# Patient Record
Sex: Male | Born: 1935
Health system: Southern US, Community
[De-identification: ages and names within clinical notes are randomized; demographics above are authoritative.]

## PROBLEM LIST (undated history)

## (undated) DIAGNOSIS — Z9989 Dependence on other enabling machines and devices: Secondary | ICD-10-CM

## (undated) DIAGNOSIS — M1990 Unspecified osteoarthritis, unspecified site: Secondary | ICD-10-CM

## (undated) DIAGNOSIS — E291 Testicular hypofunction: Secondary | ICD-10-CM

## (undated) DIAGNOSIS — Z973 Presence of spectacles and contact lenses: Secondary | ICD-10-CM

## (undated) DIAGNOSIS — S83206A Unspecified tear of unspecified meniscus, current injury, right knee, initial encounter: Secondary | ICD-10-CM

## (undated) DIAGNOSIS — N3010 Interstitial cystitis (chronic) without hematuria: Secondary | ICD-10-CM

## (undated) DIAGNOSIS — Z8659 Personal history of other mental and behavioral disorders: Secondary | ICD-10-CM

## (undated) DIAGNOSIS — Z87828 Personal history of other (healed) physical injury and trauma: Secondary | ICD-10-CM

## (undated) DIAGNOSIS — G4733 Obstructive sleep apnea (adult) (pediatric): Secondary | ICD-10-CM

## (undated) DIAGNOSIS — Z85118 Personal history of other malignant neoplasm of bronchus and lung: Secondary | ICD-10-CM

## (undated) DIAGNOSIS — D649 Anemia, unspecified: Secondary | ICD-10-CM

## (undated) DIAGNOSIS — R300 Dysuria: Secondary | ICD-10-CM

## (undated) DIAGNOSIS — F4310 Post-traumatic stress disorder, unspecified: Secondary | ICD-10-CM

## (undated) DIAGNOSIS — R911 Solitary pulmonary nodule: Secondary | ICD-10-CM

## (undated) DIAGNOSIS — F32A Depression, unspecified: Secondary | ICD-10-CM

## (undated) DIAGNOSIS — R399 Unspecified symptoms and signs involving the genitourinary system: Secondary | ICD-10-CM

## (undated) DIAGNOSIS — Z860100 Personal history of colon polyps, unspecified: Secondary | ICD-10-CM

## (undated) DIAGNOSIS — N529 Male erectile dysfunction, unspecified: Secondary | ICD-10-CM

## (undated) DIAGNOSIS — IMO0002 Reserved for concepts with insufficient information to code with codable children: Secondary | ICD-10-CM

## (undated) DIAGNOSIS — Z8669 Personal history of other diseases of the nervous system and sense organs: Secondary | ICD-10-CM

## (undated) DIAGNOSIS — C61 Malignant neoplasm of prostate: Secondary | ICD-10-CM

## (undated) DIAGNOSIS — E785 Hyperlipidemia, unspecified: Secondary | ICD-10-CM

## (undated) DIAGNOSIS — R972 Elevated prostate specific antigen [PSA]: Secondary | ICD-10-CM

## (undated) DIAGNOSIS — M79672 Pain in left foot: Secondary | ICD-10-CM

## (undated) DIAGNOSIS — Z972 Presence of dental prosthetic device (complete) (partial): Secondary | ICD-10-CM

## (undated) DIAGNOSIS — I700 Atherosclerosis of aorta: Secondary | ICD-10-CM

## (undated) DIAGNOSIS — Z87898 Personal history of other specified conditions: Secondary | ICD-10-CM

## (undated) DIAGNOSIS — Z8739 Personal history of other diseases of the musculoskeletal system and connective tissue: Secondary | ICD-10-CM

## (undated) DIAGNOSIS — R319 Hematuria, unspecified: Secondary | ICD-10-CM

## (undated) DIAGNOSIS — J449 Chronic obstructive pulmonary disease, unspecified: Secondary | ICD-10-CM

## (undated) DIAGNOSIS — I10 Essential (primary) hypertension: Secondary | ICD-10-CM

## (undated) DIAGNOSIS — R600 Localized edema: Secondary | ICD-10-CM

## (undated) DIAGNOSIS — R7303 Prediabetes: Secondary | ICD-10-CM

## (undated) DIAGNOSIS — M722 Plantar fascial fibromatosis: Secondary | ICD-10-CM

## (undated) DIAGNOSIS — E042 Nontoxic multinodular goiter: Secondary | ICD-10-CM

## (undated) DIAGNOSIS — C3491 Malignant neoplasm of unspecified part of right bronchus or lung: Secondary | ICD-10-CM

## (undated) DIAGNOSIS — N400 Enlarged prostate without lower urinary tract symptoms: Secondary | ICD-10-CM

## (undated) DIAGNOSIS — G609 Hereditary and idiopathic neuropathy, unspecified: Secondary | ICD-10-CM

## (undated) DIAGNOSIS — IMO0001 Reserved for inherently not codable concepts without codable children: Secondary | ICD-10-CM

## (undated) HISTORY — PX: PROSTATE BIOPSY: SHX241

## (undated) HISTORY — DX: Personal history of colon polyps, unspecified: Z86.0100

## (undated) HISTORY — DX: Hyperlipidemia, unspecified: E78.5

## (undated) HISTORY — PX: COLONOSCOPY: SHX174

## (undated) HISTORY — DX: Reserved for concepts with insufficient information to code with codable children: IMO0002

## (undated) HISTORY — DX: Solitary pulmonary nodule: R91.1

## (undated) HISTORY — DX: Nontoxic multinodular goiter: E04.2

## (undated) HISTORY — DX: Personal history of colonic polyps: Z86.010

## (undated) HISTORY — DX: Reserved for inherently not codable concepts without codable children: IMO0001

## (undated) HISTORY — DX: Essential (primary) hypertension: I10

## (undated) HISTORY — PX: SHOULDER SURGERY: SHX246

---
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
## 1998-03-10 ENCOUNTER — Ambulatory Visit (HOSPITAL_COMMUNITY): Admission: RE | Admit: 1998-03-10 | Discharge: 1998-03-10 | Payer: Self-pay | Admitting: Gastroenterology

## 1999-04-12 ENCOUNTER — Encounter: Payer: Self-pay | Admitting: *Deleted

## 1999-04-12 ENCOUNTER — Ambulatory Visit (HOSPITAL_COMMUNITY): Admission: RE | Admit: 1999-04-12 | Discharge: 1999-04-12 | Payer: Self-pay | Admitting: *Deleted

## 2001-03-20 ENCOUNTER — Encounter (INDEPENDENT_AMBULATORY_CARE_PROVIDER_SITE_OTHER): Payer: Self-pay | Admitting: *Deleted

## 2001-03-20 ENCOUNTER — Ambulatory Visit (HOSPITAL_COMMUNITY): Admission: RE | Admit: 2001-03-20 | Discharge: 2001-03-20 | Payer: Self-pay | Admitting: Gastroenterology

## 2001-08-03 ENCOUNTER — Encounter: Admission: RE | Admit: 2001-08-03 | Discharge: 2001-08-03 | Payer: Self-pay | Admitting: *Deleted

## 2006-04-24 ENCOUNTER — Ambulatory Visit: Payer: Self-pay | Admitting: Internal Medicine

## 2006-04-24 LAB — CONVERTED CEMR LAB
AST: 22 units/L (ref 0–37)
BUN: 19 mg/dL (ref 6–23)
Bilirubin Urine: NEGATIVE
Chloride: 103 meq/L (ref 96–112)
Chol/HDL Ratio, serum: 7.2
Creatinine, Ser: 1.2 mg/dL (ref 0.4–1.5)
Crystals: NEGATIVE
Eosinophil percent: 3.6 % (ref 0.0–5.0)
HCT: 42.8 % (ref 39.0–52.0)
HDL: 35.3 mg/dL — ABNORMAL LOW (ref >39.0)
Hemoglobin: 14.5 g/dL (ref 13.0–17.0)
Leukocytes, UA: NEGATIVE
Lymphocytes Relative: 38.2 % (ref 12.0–46.0)
MCHC: 33.8 g/dL (ref 30.0–36.0)
MCV: 90.5 fL (ref 78.0–100.0)
Monocytes Absolute: 0.3 10*3/uL (ref 0.2–0.7)
Neutro Abs: 2.9 10*3/uL (ref 1.4–7.7)
Neutrophils Relative %: 51.4 % (ref 43.0–77.0)
Nitrite: NEGATIVE
PSA: 2.95 ng/mL (ref 0.10–4.00)
Triglyceride fasting, serum: 219 mg/dL (ref 0–149)
Urine Glucose: NEGATIVE mg/dL
Urobilinogen, UA: 0.2 (ref 0.0–1.0)

## 2006-04-28 ENCOUNTER — Ambulatory Visit: Payer: Self-pay | Admitting: Internal Medicine

## 2006-05-11 ENCOUNTER — Ambulatory Visit: Payer: Self-pay | Admitting: Internal Medicine

## 2006-05-15 ENCOUNTER — Ambulatory Visit: Payer: Self-pay | Admitting: Internal Medicine

## 2006-05-15 LAB — CONVERTED CEMR LAB
HDL: 43 mg/dL (ref >39.0)
LDL DIRECT: 189 mg/dL
Triglyceride fasting, serum: 158 mg/dL — ABNORMAL HIGH (ref 0–149)
VLDL: 32 mg/dL (ref 0–40)

## 2006-05-16 ENCOUNTER — Ambulatory Visit: Payer: Self-pay | Admitting: Internal Medicine

## 2006-06-19 ENCOUNTER — Ambulatory Visit: Payer: Self-pay | Admitting: Internal Medicine

## 2006-06-23 ENCOUNTER — Ambulatory Visit: Payer: Self-pay | Admitting: Internal Medicine

## 2006-08-04 ENCOUNTER — Ambulatory Visit: Payer: Self-pay | Admitting: Internal Medicine

## 2006-10-17 ENCOUNTER — Ambulatory Visit: Payer: Self-pay | Admitting: Internal Medicine

## 2006-10-17 LAB — CONVERTED CEMR LAB: TSH: 1.94 microintl units/mL (ref 0.35–5.50)

## 2006-10-24 ENCOUNTER — Ambulatory Visit: Payer: Self-pay | Admitting: Internal Medicine

## 2006-11-13 ENCOUNTER — Ambulatory Visit: Payer: Self-pay | Admitting: Internal Medicine

## 2006-11-13 LAB — CONVERTED CEMR LAB
ALT: 51 units/L — ABNORMAL HIGH (ref 0–40)
AST: 37 units/L (ref 0–37)
Alkaline Phosphatase: 64 units/L (ref 39–117)
Bilirubin, Direct: 0.1 mg/dL (ref 0.0–0.3)
Calcium: 9.6 mg/dL (ref 8.4–10.5)
Chloride: 107 meq/L (ref 96–112)
GFR calc non Af Amer: 78 mL/min
Glucose, Bld: 116 mg/dL — ABNORMAL HIGH (ref 70–99)

## 2006-11-23 ENCOUNTER — Ambulatory Visit: Payer: Self-pay | Admitting: Internal Medicine

## 2006-12-07 ENCOUNTER — Ambulatory Visit: Payer: Self-pay | Admitting: Internal Medicine

## 2006-12-09 ENCOUNTER — Encounter: Payer: Self-pay | Admitting: Internal Medicine

## 2006-12-18 ENCOUNTER — Ambulatory Visit: Payer: Self-pay | Admitting: Cardiology

## 2006-12-25 ENCOUNTER — Ambulatory Visit: Payer: Self-pay | Admitting: Internal Medicine

## 2007-01-01 ENCOUNTER — Encounter: Admission: RE | Admit: 2007-01-01 | Discharge: 2007-01-01 | Payer: Self-pay | Admitting: Internal Medicine

## 2007-02-02 ENCOUNTER — Encounter: Payer: Self-pay | Admitting: Internal Medicine

## 2007-02-02 DIAGNOSIS — Z860100 Personal history of colon polyps, unspecified: Secondary | ICD-10-CM | POA: Insufficient documentation

## 2007-02-02 DIAGNOSIS — E291 Testicular hypofunction: Secondary | ICD-10-CM

## 2007-02-02 DIAGNOSIS — E785 Hyperlipidemia, unspecified: Secondary | ICD-10-CM | POA: Insufficient documentation

## 2007-02-02 DIAGNOSIS — I10 Essential (primary) hypertension: Secondary | ICD-10-CM

## 2007-02-02 DIAGNOSIS — N400 Enlarged prostate without lower urinary tract symptoms: Secondary | ICD-10-CM

## 2007-02-05 DIAGNOSIS — M109 Gout, unspecified: Secondary | ICD-10-CM

## 2007-02-07 ENCOUNTER — Ambulatory Visit: Payer: Self-pay | Admitting: Internal Medicine

## 2007-02-12 ENCOUNTER — Ambulatory Visit: Payer: Self-pay | Admitting: Internal Medicine

## 2007-02-14 ENCOUNTER — Ambulatory Visit (HOSPITAL_COMMUNITY): Admission: RE | Admit: 2007-02-14 | Discharge: 2007-02-14 | Payer: Self-pay | Admitting: Urology

## 2007-02-14 HISTORY — PX: SPERMATOCELECTOMY: SHX2420

## 2007-03-07 ENCOUNTER — Ambulatory Visit: Payer: Self-pay | Admitting: Internal Medicine

## 2007-03-07 LAB — CONVERTED CEMR LAB
BUN: 18 mg/dL (ref 6–23)
CO2: 29 meq/L (ref 19–32)
Calcium: 9.7 mg/dL (ref 8.4–10.5)
Chloride: 106 meq/L (ref 96–112)
Creatinine, Ser: 1.1 mg/dL (ref 0.4–1.5)
Sex Hormone Binding: 8 nmol/L — ABNORMAL LOW (ref 13–71)
Testosterone Free: 44.7 pg/mL — ABNORMAL LOW (ref 47.0–244.0)
Testosterone: 133.03 ng/dL — ABNORMAL LOW (ref 350.00–890)
Testosterone: 135.87 ng/dL — ABNORMAL LOW (ref 350–890)
Uric Acid, Serum: 7.3 mg/dL — ABNORMAL HIGH (ref 2.4–7.0)

## 2007-03-29 ENCOUNTER — Ambulatory Visit: Payer: Self-pay | Admitting: Internal Medicine

## 2007-04-05 ENCOUNTER — Ambulatory Visit: Payer: Self-pay | Admitting: Internal Medicine

## 2007-04-10 ENCOUNTER — Encounter: Admission: RE | Admit: 2007-04-10 | Discharge: 2007-04-10 | Payer: Self-pay | Admitting: Internal Medicine

## 2007-05-10 ENCOUNTER — Ambulatory Visit: Payer: Self-pay | Admitting: Internal Medicine

## 2007-05-28 ENCOUNTER — Ambulatory Visit: Payer: Self-pay | Admitting: Internal Medicine

## 2007-06-12 ENCOUNTER — Encounter: Payer: Self-pay | Admitting: Internal Medicine

## 2007-06-13 ENCOUNTER — Telehealth: Payer: Self-pay | Admitting: Internal Medicine

## 2007-06-18 ENCOUNTER — Ambulatory Visit: Payer: Self-pay | Admitting: Cardiovascular Disease

## 2007-07-02 ENCOUNTER — Telehealth: Payer: Self-pay | Admitting: Internal Medicine

## 2007-07-09 ENCOUNTER — Telehealth: Payer: Self-pay | Admitting: Internal Medicine

## 2007-07-25 ENCOUNTER — Ambulatory Visit: Payer: Self-pay | Admitting: Thoracic Surgery

## 2007-07-31 ENCOUNTER — Ambulatory Visit (HOSPITAL_COMMUNITY): Admission: RE | Admit: 2007-07-31 | Discharge: 2007-07-31 | Payer: Self-pay | Admitting: Thoracic Surgery

## 2007-08-03 ENCOUNTER — Ambulatory Visit: Payer: Self-pay | Admitting: Internal Medicine

## 2007-08-03 DIAGNOSIS — J984 Other disorders of lung: Secondary | ICD-10-CM | POA: Insufficient documentation

## 2007-08-06 ENCOUNTER — Encounter: Payer: Self-pay | Admitting: Internal Medicine

## 2007-08-06 HISTORY — PX: CARDIOVASCULAR STRESS TEST: SHX262

## 2007-08-09 ENCOUNTER — Encounter: Payer: Self-pay | Admitting: Thoracic Surgery

## 2007-08-09 ENCOUNTER — Inpatient Hospital Stay (HOSPITAL_COMMUNITY): Admission: RE | Admit: 2007-08-09 | Discharge: 2007-08-14 | Payer: Self-pay | Admitting: Thoracic Surgery

## 2007-08-09 HISTORY — PX: VIDEO ASSISTED THORACOSCOPY (VATS)/WEDGE RESECTION: SHX6174

## 2007-08-10 ENCOUNTER — Ambulatory Visit: Payer: Self-pay | Admitting: Thoracic Surgery

## 2007-08-23 ENCOUNTER — Encounter: Admission: RE | Admit: 2007-08-23 | Discharge: 2007-08-23 | Payer: Self-pay | Admitting: Thoracic Surgery

## 2007-08-23 ENCOUNTER — Ambulatory Visit: Payer: Self-pay | Admitting: Thoracic Surgery

## 2007-09-06 ENCOUNTER — Encounter: Admission: RE | Admit: 2007-09-06 | Discharge: 2007-09-06 | Payer: Self-pay | Admitting: Thoracic Surgery

## 2007-09-06 ENCOUNTER — Ambulatory Visit: Payer: Self-pay | Admitting: Thoracic Surgery

## 2007-09-17 ENCOUNTER — Ambulatory Visit: Payer: Self-pay | Admitting: Thoracic Surgery

## 2007-09-25 ENCOUNTER — Ambulatory Visit: Payer: Self-pay | Admitting: Internal Medicine

## 2007-09-25 DIAGNOSIS — M545 Low back pain: Secondary | ICD-10-CM

## 2007-10-18 ENCOUNTER — Ambulatory Visit: Payer: Self-pay | Admitting: Thoracic Surgery

## 2007-10-18 ENCOUNTER — Encounter: Admission: RE | Admit: 2007-10-18 | Discharge: 2007-10-18 | Payer: Self-pay | Admitting: Thoracic Surgery

## 2007-10-22 ENCOUNTER — Telehealth: Payer: Self-pay | Admitting: Internal Medicine

## 2007-11-09 ENCOUNTER — Telehealth (INDEPENDENT_AMBULATORY_CARE_PROVIDER_SITE_OTHER): Payer: Self-pay | Admitting: *Deleted

## 2007-11-29 ENCOUNTER — Encounter: Payer: Self-pay | Admitting: Internal Medicine

## 2007-12-08 ENCOUNTER — Encounter: Admission: RE | Admit: 2007-12-08 | Discharge: 2007-12-08 | Payer: Self-pay | Admitting: Neurology

## 2007-12-19 ENCOUNTER — Ambulatory Visit: Payer: Self-pay | Admitting: Oncology

## 2007-12-27 ENCOUNTER — Encounter: Payer: Self-pay | Admitting: Internal Medicine

## 2007-12-27 LAB — CBC WITH DIFFERENTIAL/PLATELET
Basophils Absolute: 0 10*3/uL (ref 0.0–0.1)
EOS%: 4.4 % (ref 0.0–7.0)
Eosinophils Absolute: 0.3 10*3/uL (ref 0.0–0.5)
HCT: 40.5 % (ref 38.7–49.9)
HGB: 14 g/dL (ref 13.0–17.1)
MCH: 30.3 pg (ref 28.0–33.4)
MCV: 87.6 fL (ref 81.6–98.0)
NEUT%: 51.5 % (ref 40.0–75.0)
lymph#: 2.2 10*3/uL (ref 0.9–3.3)

## 2007-12-28 ENCOUNTER — Encounter: Payer: Self-pay | Admitting: Internal Medicine

## 2007-12-31 LAB — SPEP & IFE WITH QIG
Albumin ELP: 60.5 % (ref 55.8–66.1)
Beta Globulin: 6.3 % (ref 4.7–7.2)
IgA: 252 mg/dL (ref 68–378)
IgG (Immunoglobin G), Serum: 860 mg/dL (ref 694–1618)
IgM, Serum: 60 mg/dL (ref 60–263)
Total Protein, Serum Electrophoresis: 7 g/dL (ref 6.0–8.3)

## 2007-12-31 LAB — COMPREHENSIVE METABOLIC PANEL
AST: 11 U/L (ref 0–37)
BUN: 17 mg/dL (ref 6–23)
Calcium: 9.1 mg/dL (ref 8.4–10.5)
Chloride: 104 mEq/L (ref 96–112)
Creatinine, Ser: 1.02 mg/dL (ref 0.40–1.50)
Glucose, Bld: 90 mg/dL (ref 70–99)

## 2008-01-02 ENCOUNTER — Ambulatory Visit (HOSPITAL_COMMUNITY): Admission: RE | Admit: 2008-01-02 | Discharge: 2008-01-02 | Payer: Self-pay | Admitting: Oncology

## 2008-01-02 LAB — UIFE/LIGHT CHAINS/TP QN, 24-HR UR
Alpha 1, Urine: DETECTED — AB
Free Kappa Lt Chains,Ur: 1.39 mg/dL (ref 0.04–1.51)
Free Kappa/Lambda Ratio: 13.9 ratio — ABNORMAL HIGH (ref 0.46–4.00)
Free Lambda Excretion/Day: 2.15 mg/d
Free Lt Chn Excr Rate: 29.89 mg/d
Gamma Globulin, Urine: DETECTED — AB
Time: 24 hours
Total Protein, Urine: 2.6 mg/dL

## 2008-01-16 ENCOUNTER — Ambulatory Visit: Payer: Self-pay | Admitting: Internal Medicine

## 2008-01-16 LAB — CONVERTED CEMR LAB
BUN: 14 mg/dL (ref 6–23)
Creatinine, Ser: 1 mg/dL (ref 0.4–1.5)
GFR calc Af Amer: 93 mL/min
GFR calc non Af Amer: 77 mL/min
Glucose, Bld: 101 mg/dL — ABNORMAL HIGH (ref 70–99)

## 2008-01-17 ENCOUNTER — Encounter: Payer: Self-pay | Admitting: Internal Medicine

## 2008-01-22 ENCOUNTER — Ambulatory Visit: Payer: Self-pay | Admitting: Internal Medicine

## 2008-01-22 DIAGNOSIS — R040 Epistaxis: Secondary | ICD-10-CM

## 2008-01-22 DIAGNOSIS — H4920 Sixth [abducent] nerve palsy, unspecified eye: Secondary | ICD-10-CM | POA: Insufficient documentation

## 2008-01-25 ENCOUNTER — Encounter: Payer: Self-pay | Admitting: Internal Medicine

## 2008-02-20 ENCOUNTER — Ambulatory Visit: Payer: Self-pay | Admitting: Thoracic Surgery

## 2008-02-20 ENCOUNTER — Encounter: Admission: RE | Admit: 2008-02-20 | Discharge: 2008-02-20 | Payer: Self-pay | Admitting: Thoracic Surgery

## 2008-03-04 ENCOUNTER — Encounter: Payer: Self-pay | Admitting: Internal Medicine

## 2008-04-07 ENCOUNTER — Telehealth: Payer: Self-pay | Admitting: Internal Medicine

## 2008-04-21 ENCOUNTER — Ambulatory Visit: Payer: Self-pay | Admitting: Internal Medicine

## 2008-05-14 ENCOUNTER — Encounter: Payer: Self-pay | Admitting: Internal Medicine

## 2008-05-23 ENCOUNTER — Encounter: Admission: RE | Admit: 2008-05-23 | Discharge: 2008-05-23 | Payer: Self-pay | Admitting: Neurology

## 2008-06-12 ENCOUNTER — Encounter: Payer: Self-pay | Admitting: Internal Medicine

## 2008-06-20 ENCOUNTER — Telehealth: Payer: Self-pay | Admitting: Internal Medicine

## 2008-06-24 ENCOUNTER — Ambulatory Visit: Payer: Self-pay | Admitting: Internal Medicine

## 2008-06-25 LAB — CONVERTED CEMR LAB
ALT: 17 units/L (ref 0–53)
Alkaline Phosphatase: 65 units/L (ref 39–117)
Bilirubin, Direct: 0.2 mg/dL (ref 0.0–0.3)
CO2: 29 meq/L (ref 19–32)
GFR calc Af Amer: 93 mL/min
Glucose, Bld: 112 mg/dL — ABNORMAL HIGH (ref 70–99)
PSA: 3.16 ng/mL (ref 0.10–4.00)
Potassium: 5.3 meq/L — ABNORMAL HIGH (ref 3.5–5.1)
Sodium: 141 meq/L (ref 135–145)
Total Bilirubin: 1 mg/dL (ref 0.3–1.2)
Total CHOL/HDL Ratio: 6.6
Total Protein: 6.5 g/dL (ref 6.0–8.3)

## 2008-06-30 ENCOUNTER — Ambulatory Visit: Payer: Self-pay | Admitting: Internal Medicine

## 2008-06-30 DIAGNOSIS — G609 Hereditary and idiopathic neuropathy, unspecified: Secondary | ICD-10-CM | POA: Insufficient documentation

## 2008-07-11 ENCOUNTER — Ambulatory Visit: Payer: Self-pay | Admitting: Internal Medicine

## 2008-07-11 DIAGNOSIS — E875 Hyperkalemia: Secondary | ICD-10-CM | POA: Insufficient documentation

## 2008-08-20 ENCOUNTER — Encounter: Admission: RE | Admit: 2008-08-20 | Discharge: 2008-08-20 | Payer: Self-pay | Admitting: Thoracic Surgery

## 2008-08-20 ENCOUNTER — Ambulatory Visit: Payer: Self-pay | Admitting: Thoracic Surgery

## 2008-10-07 ENCOUNTER — Ambulatory Visit: Payer: Self-pay | Admitting: Internal Medicine

## 2008-10-07 ENCOUNTER — Telehealth: Payer: Self-pay | Admitting: Internal Medicine

## 2008-10-09 ENCOUNTER — Ambulatory Visit: Payer: Self-pay | Admitting: Internal Medicine

## 2008-10-09 LAB — CONVERTED CEMR LAB
CO2: 29 meq/L (ref 19–32)
Calcium: 9.5 mg/dL (ref 8.4–10.5)
Chloride: 102 meq/L (ref 96–112)
Glucose, Bld: 100 mg/dL — ABNORMAL HIGH (ref 70–99)
HDL: 34.9 mg/dL — ABNORMAL LOW (ref >39.00)
Hgb A1c MFr Bld: 6.2 % (ref 4.6–6.5)
Sodium: 139 meq/L (ref 135–145)
Total CHOL/HDL Ratio: 7
Triglycerides: 189 mg/dL — ABNORMAL HIGH (ref 0.0–149.0)
Vitamin B-12: 368 pg/mL (ref 211–911)

## 2008-10-12 ENCOUNTER — Encounter: Payer: Self-pay | Admitting: Internal Medicine

## 2008-12-02 ENCOUNTER — Telehealth: Payer: Self-pay | Admitting: Internal Medicine

## 2009-02-25 ENCOUNTER — Ambulatory Visit: Payer: Self-pay | Admitting: Thoracic Surgery

## 2009-02-25 ENCOUNTER — Encounter: Admission: RE | Admit: 2009-02-25 | Discharge: 2009-02-25 | Payer: Self-pay | Admitting: Thoracic Surgery

## 2009-03-12 ENCOUNTER — Ambulatory Visit: Payer: Self-pay | Admitting: Oncology

## 2009-03-16 LAB — CBC WITH DIFFERENTIAL/PLATELET
Eosinophils Absolute: 0.3 10*3/uL (ref 0.0–0.5)
MONO#: 0.5 10*3/uL (ref 0.1–0.9)
NEUT#: 3.5 10*3/uL (ref 1.5–6.5)
Platelets: 306 10*3/uL (ref 140–400)
RBC: 4.22 10*6/uL (ref 4.20–5.82)
RDW: 13.2 % (ref 11.0–14.6)
WBC: 6.4 10*3/uL (ref 4.0–10.3)

## 2009-03-18 LAB — COMPREHENSIVE METABOLIC PANEL
Albumin: 4.3 g/dL (ref 3.5–5.2)
CO2: 28 mEq/L (ref 19–32)
Glucose, Bld: 100 mg/dL — ABNORMAL HIGH (ref 70–99)
Potassium: 4.3 mEq/L (ref 3.5–5.3)
Sodium: 141 mEq/L (ref 135–145)
Total Protein: 6.7 g/dL (ref 6.0–8.3)

## 2009-03-18 LAB — SPEP & IFE WITH QIG
Albumin ELP: 59.9 % (ref 55.8–66.1)
Alpha-1-Globulin: 4.4 % (ref 2.9–4.9)
Beta 2: 5.4 % (ref 3.2–6.5)
Beta Globulin: 6.3 % (ref 4.7–7.2)

## 2009-03-18 LAB — KAPPA/LAMBDA LIGHT CHAINS
Kappa free light chain: 0.95 mg/dL (ref 0.33–1.94)
Lambda Free Lght Chn: 1.29 mg/dL (ref 0.57–2.63)

## 2009-03-24 ENCOUNTER — Encounter: Payer: Self-pay | Admitting: Internal Medicine

## 2009-04-01 ENCOUNTER — Telehealth: Payer: Self-pay | Admitting: Internal Medicine

## 2009-04-27 ENCOUNTER — Ambulatory Visit: Payer: Self-pay | Admitting: Internal Medicine

## 2009-04-30 ENCOUNTER — Ambulatory Visit: Payer: Self-pay | Admitting: Internal Medicine

## 2009-05-06 ENCOUNTER — Encounter: Payer: Self-pay | Admitting: Internal Medicine

## 2009-05-13 ENCOUNTER — Telehealth: Payer: Self-pay | Admitting: Internal Medicine

## 2009-05-14 ENCOUNTER — Ambulatory Visit: Payer: Self-pay | Admitting: Internal Medicine

## 2009-05-14 DIAGNOSIS — J069 Acute upper respiratory infection, unspecified: Secondary | ICD-10-CM | POA: Insufficient documentation

## 2009-06-01 ENCOUNTER — Encounter: Payer: Self-pay | Admitting: Internal Medicine

## 2009-07-02 ENCOUNTER — Telehealth: Payer: Self-pay | Admitting: Internal Medicine

## 2009-08-05 ENCOUNTER — Ambulatory Visit: Payer: Self-pay | Admitting: Thoracic Surgery

## 2009-08-05 ENCOUNTER — Encounter: Admission: RE | Admit: 2009-08-05 | Discharge: 2009-08-05 | Payer: Self-pay | Admitting: Thoracic Surgery

## 2009-09-20 ENCOUNTER — Emergency Department (HOSPITAL_COMMUNITY): Admission: EM | Admit: 2009-09-20 | Discharge: 2009-09-20 | Payer: Self-pay | Admitting: Emergency Medicine

## 2009-09-23 ENCOUNTER — Ambulatory Visit: Payer: Self-pay | Admitting: Internal Medicine

## 2009-09-24 ENCOUNTER — Telehealth: Payer: Self-pay | Admitting: Internal Medicine

## 2009-10-07 ENCOUNTER — Encounter: Payer: Self-pay | Admitting: Internal Medicine

## 2009-12-11 ENCOUNTER — Ambulatory Visit: Payer: Self-pay | Admitting: Oncology

## 2010-02-03 ENCOUNTER — Ambulatory Visit: Payer: Self-pay | Admitting: Thoracic Surgery

## 2010-02-03 ENCOUNTER — Encounter: Admission: RE | Admit: 2010-02-03 | Discharge: 2010-02-03 | Payer: Self-pay | Admitting: Thoracic Surgery

## 2010-02-15 ENCOUNTER — Telehealth: Payer: Self-pay | Admitting: Internal Medicine

## 2010-02-17 ENCOUNTER — Encounter: Payer: Self-pay | Admitting: Internal Medicine

## 2010-03-22 ENCOUNTER — Ambulatory Visit: Payer: Self-pay | Admitting: Internal Medicine

## 2010-04-15 ENCOUNTER — Telehealth: Payer: Self-pay | Admitting: Internal Medicine

## 2010-04-15 DIAGNOSIS — G479 Sleep disorder, unspecified: Secondary | ICD-10-CM | POA: Insufficient documentation

## 2010-05-17 ENCOUNTER — Ambulatory Visit: Payer: Self-pay | Admitting: Internal Medicine

## 2010-05-17 ENCOUNTER — Telehealth: Payer: Self-pay | Admitting: Internal Medicine

## 2010-05-17 LAB — CONVERTED CEMR LAB
LDL Cholesterol: 119 mg/dL — ABNORMAL HIGH (ref 0–99)
Total CHOL/HDL Ratio: 5
Triglycerides: 175 mg/dL — ABNORMAL HIGH (ref 0.0–149.0)

## 2010-05-20 ENCOUNTER — Encounter: Payer: Self-pay | Admitting: Internal Medicine

## 2010-05-31 ENCOUNTER — Encounter: Payer: Self-pay | Admitting: Internal Medicine

## 2010-05-31 ENCOUNTER — Telehealth (INDEPENDENT_AMBULATORY_CARE_PROVIDER_SITE_OTHER): Payer: Self-pay | Admitting: *Deleted

## 2010-06-04 ENCOUNTER — Ambulatory Visit (HOSPITAL_BASED_OUTPATIENT_CLINIC_OR_DEPARTMENT_OTHER)
Admission: RE | Admit: 2010-06-04 | Discharge: 2010-06-04 | Payer: Self-pay | Source: Home / Self Care | Admitting: Internal Medicine

## 2010-06-04 ENCOUNTER — Encounter: Payer: Self-pay | Admitting: Pulmonary Disease

## 2010-06-22 ENCOUNTER — Encounter (INDEPENDENT_AMBULATORY_CARE_PROVIDER_SITE_OTHER): Payer: Self-pay | Admitting: *Deleted

## 2010-06-23 ENCOUNTER — Ambulatory Visit
Admission: RE | Admit: 2010-06-23 | Discharge: 2010-06-23 | Payer: Self-pay | Source: Home / Self Care | Attending: Orthopedic Surgery | Admitting: Orthopedic Surgery

## 2010-06-30 ENCOUNTER — Ambulatory Visit: Payer: Self-pay | Admitting: Pulmonary Disease

## 2010-06-30 DIAGNOSIS — G4733 Obstructive sleep apnea (adult) (pediatric): Secondary | ICD-10-CM | POA: Insufficient documentation

## 2010-06-30 DIAGNOSIS — Z9989 Dependence on other enabling machines and devices: Secondary | ICD-10-CM

## 2010-07-05 ENCOUNTER — Encounter: Payer: Self-pay | Admitting: Pulmonary Disease

## 2010-07-08 ENCOUNTER — Encounter: Payer: Self-pay | Admitting: Internal Medicine

## 2010-07-15 ENCOUNTER — Encounter: Payer: Self-pay | Admitting: Internal Medicine

## 2010-07-22 ENCOUNTER — Encounter: Payer: Self-pay | Admitting: Internal Medicine

## 2010-07-24 ENCOUNTER — Other Ambulatory Visit: Payer: Self-pay | Admitting: Thoracic Surgery

## 2010-07-24 ENCOUNTER — Encounter: Payer: Self-pay | Admitting: Endocrinology

## 2010-07-24 DIAGNOSIS — R911 Solitary pulmonary nodule: Secondary | ICD-10-CM

## 2010-07-25 ENCOUNTER — Encounter: Payer: Self-pay | Admitting: Internal Medicine

## 2010-07-27 ENCOUNTER — Encounter: Payer: Self-pay | Admitting: Pulmonary Disease

## 2010-08-03 NOTE — Progress Notes (Signed)
Summary: REQ FOR RX  Phone Note Call from Patient Call back at [facility] Phone [phone]   Summary of Call: Pt c/o sinus congestion and pressure, he feels that he has a sinus infection. Patient is requesting rx for zpak.  Initial call taken by: Lamar Sprinkles, CMA,  May 17, 2010 9:24 AM  Follow-up for Phone Call        patinet has an appointment this PM Follow-up by: Jacques Navy MD,  May 17, 2010 1:24 PM

## 2010-08-03 NOTE — Assessment & Plan Note (Signed)
Summary: f/u appt/#/cd   Vital Signs:  Patient profile:   77 year old male Height:      68 inches Weight:      232 pounds BMI:     35.40 O2 Sat:      98 % on Room air Temp:     98.0 degrees F oral Pulse rate:   73 / minute BP sitting:   142 / 78  (left arm) Cuff size:   large  Vitals Entered By: Bill Salinas CMA (March 22, 2010 1:11 PM)  O2 Flow:  Room air  Primary Care Provider:  Field Staniszewski   History of Present Illness: Patient presents for routine medical follow-up. He continues to see neurologist in [facility] and is doing well on cymbalta with reduction in pain and the need for medications.   He has been feels generally well all things considered. Patient continues to go to the [place] and has had recent labs (most recent in our system in April '10).   He saw Dr. Vonita Moss during the year. He had a prostate biopsy by Dr. Vonita Moss  May '11 - negative for malignancy.  Current Medications (verified): 1)  Flomax 0.4 Mg  Cp24 (Tamsulosin Hcl) .... Take 1 Tablet By Mouth Once A Day 2)  Fish Oil 1000 Mg  Caps (Omega-3 Fatty Acids) .... Take 1 Tablet By Mouth Once A Day 3)  Norvasc 10 Mg  Tabs (Amlodipine Besylate) .... Take 1 Tablet By Mouth Once A Day 4)  Cozaar 100 Mg Tabs (Losartan Potassium) .Marland Kitchen.. 1 By Mouth Once Daily 5)  Coreg Cr 20 Mg  Cp24 (Carvedilol Phosphate) .... Take 1 Tablet By Mouth Once A Day 6)  Klor-Con M20 20 Meq  Tbcr (Potassium Chloride Crys Cr) .... Take 1 Tablet By Mouth Once A Day 7)  Calcium Citrate-Vitamin D 1500-200 Mg-Unit  Tabs (Calcium Citrate-Vitamin D) .... Take 1 Tablet By Mouth Once A Day 8)  Cymbalta 60 Mg Cpep (Duloxetine Hcl) .Marland Kitchen.. 1 Once Daily 9)  Aspirin 81 Mg Tbec (Aspirin) .Marland Kitchen.. 1 Tab Daily  Allergies: 1)  ! Sular (Nisoldipine) 2)  ! 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  08/2007   Family History: Last updated: 02-03-2008 father-deceased @70 : prostate cancer mother - deceased @ 18: metastatic cancer to bone, DM Neg- colon cancer; DM, CAD  Social History: Last updated: 07/11/2008  [employer]  all but finished engineering. ARmy - 18 months-[place] veteran. 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         vision has improved and he is able to drive  Physical Exam  General:  alert, well-developed, well-nourished, and well-hydrated.   Head:  normocephalic and atraumatic.   Eyes:  pupils equal, pupils round, corneas and lenses clear, and no injection.   Ears:  External ear exam shows no significant lesions or deformities.  Otoscopic examination reveals clear canals, tympanic membranes are intact  bilaterally without bulging, retraction, inflammation or discharge. Hearing is grossly normal bilaterally. Nose:  no external deformity and no external erythema.   Mouth:  Oral mucosa and oropharynx without lesions or exudates.  Teeth in good repair. Neck:  full ROM, no thyromegaly, and no carotid bruits.   Chest Wall:  no deformities.   Lungs:  normal respiratory effort, no crackles, and no wheezes.   Heart:  normal rate, regular rhythm, and no JVD.  Occasional PVCs Abdomen:  soft and normal bowel sounds.   Msk:  normal ROM, no joint swelling, and no joint warmth.   Pulses:  2+ radial pulse bilaterally Extremities:  No  clubbing, cyanosis, edema, or deformity noted with normal full range of motion of all joints.   Neurologic:  alert & oriented X3, cranial nerves II-XII intact, gait normal, and DTRs symmetrical and normal.     Impression & Recommendations:  Problem # 1:  PERIPHERAL NEUROPATHY (ICD-356.9) stable and doing better on cymbalta.  No change in regimen  Problem # 2:  GOUT (ICD-274.9) Stable with no flares  Problem # 3:  HYPERTENSION (ICD-401.9)  His updated medication list for this problem includes:    Norvasc 10 Mg Tabs (Amlodipine besylate) .Marland Kitchen... Take 1 tablet by mouth once a day    Cozaar 100 Mg Tabs (Losartan potassium) .Marland Kitchen... 1 by mouth once daily    Coreg Cr 20 Mg Cp24 (Carvedilol phosphate) .Marland Kitchen... Take 1 tablet by mouth once a day  BP today: 142/78 Prior BP: 132/82 (09/23/2009)  Adequate control on present medications. Refills sent to [facility]  Problem # 4:  HYPERLIPIDEMIA (ICD-272.4) Patient has lab at the [place]. He reports that he has been well controlled.   Problem # 5:  Preventive Health Care (ICD-V70.0) Given flu shot today.   Complete Medication List: 1)  Flomax 0.4 Mg Cp24 (Tamsulosin hcl) .... Take 1 tablet by mouth once a day 2)  Fish Oil 1000 Mg Caps (Omega-3 fatty acids) .... Take 1 tablet by mouth once a day 3)  Norvasc 10 Mg Tabs (Amlodipine besylate) .... Take 1 tablet by mouth once a day 4)  Cozaar 100 Mg Tabs (Losartan potassium) .Marland Kitchen.. 1 by mouth once daily 5)  Coreg Cr 20 Mg Cp24 (Carvedilol phosphate) .... Take 1 tablet by mouth once a day 6)  Klor-con M20 20 Meq Tbcr (Potassium chloride crys cr) .... Take 1 tablet by mouth once a day 7)  Calcium Citrate-vitamin D 1500-200 Mg-unit Tabs (Calcium citrate-vitamin d) .... Take 1 tablet by mouth once a day 8)  Cymbalta 60 Mg Cpep (Duloxetine hcl) .Marland Kitchen.. 1 once daily 9)  Aspirin 81 Mg Tbec (Aspirin) .Marland Kitchen.. 1 tab daily  Other Orders: Future Orders: Flu Vaccine 83yrs + MEDICARE PATIENTS (Z6109) ... 03/23/2010 Administration  Flu vaccine - MCR (G0008) ... 03/23/2010 Prescriptions: COREG CR 20 MG  CP24 (CARVEDILOL PHOSPHATE) Take 1 tablet by mouth once a day Brand medically necessary #90 x 3   Entered and Authorized by:   Jacques Navy MD   Signed by:   Jacques Navy MD on 03/22/2010   Method used:   Faxed to ...       [facility] (mail-order)             , [place]         Ph: [phone]       Fax: [phone]   RxID:   [id number] NORVASC 10 MG  TABS (AMLODIPINE BESYLATE) Take 1 tablet by mouth once a day Brand  medically necessary #90 x 3   Entered and Authorized by:   Jacques Navy MD   Signed by:   Jacques Navy MD on 03/22/2010   Method used:   Faxed to ...       [facility] (mail-order)             , [place]         Ph: [phone]       Fax: [phone]   RxID:   [id number]   Flu Vaccine Consent Questions     Do you have a history of severe allergic reactions to this vaccine? no    Any prior history of allergic reactions to egg and/or gelatin? no    Do you have a sensitivity to the preservative Thimersol? no    Do you have a past history of Guillan-Barre Syndrome? no    Do you currently have an acute febrile illness? no    Have you ever had a severe reaction to latex? no    Vaccine information given and explained to patient? yes    Are you currently pregnant? no    Lot Number:[id number]   Exp Date:01/01/2011   Site Given  Left Deltoid IMmedflu

## 2010-08-03 NOTE — Progress Notes (Signed)
  Phone Note Other Incoming   Caller: pt Summary of Call: Pt called and wanted to know what medication you suggested he take for his sneezing that would not interact with any of his current medications.  Initial call taken by: Ami Bullins CMA,  September 24, 2009 9:35 AM  Follow-up for Phone Call        loratadine 10mg  once a day. Follow-up by: Jacques Navy MD,  September 24, 2009 10:32 AM  Additional Follow-up for Phone Call Additional follow up Details #1::        informed pt/ ab Additional Follow-up by: Ami Bullins CMA,  September 24, 2009 1:33 PM

## 2010-08-03 NOTE — Progress Notes (Signed)
Summary: REFILL  Phone Note Call from Patient Call back at Home Phone [phone]   Summary of Call: Patient is requesting a call back regarding Rx for norvasc.  Initial call taken by: Lamar Sprinkles, CMA,  February 15, 2010 2:53 PM  Follow-up for Phone Call        LMOVM to call back.Marland KitchenMarland KitchenMarland KitchenAlvy Beal Archie CMA  February 15, 2010 4:46 PM   Patient is requesting refill for norvasc to go to [facility]. OK? Also, when is patient due for f/u office visit?  Follow-up by: Lamar Sprinkles, CMA,  February 16, 2010 11:31 AM  Additional Follow-up for Phone Call Additional follow up Details #1::        OK for norvasc to go to [facility] for 3 months  Next OV end of September Additional Follow-up by: Jacques Navy MD,  February 16, 2010 12:48 PM    Additional Follow-up for Phone Call Additional follow up Details #2::    Pt informed he needed cozaar also, rx sent. Follow-up by: Lamar Sprinkles, CMA,  February 16, 2010 5:11 PM  Prescriptions: COZAAR 100 MG TABS (LOSARTAN POTASSIUM) 1 by mouth once daily  #90 x 3   Entered by:   Lamar Sprinkles, CMA   Authorized by:   Jacques Navy MD   Signed by:   Lamar Sprinkles, CMA on 02/16/2010   Method used:   Faxed to ...       [facility] (retail)             ,          Ph: [phone]       Fax: [phone]   RxID:   [id number] NORVASC 10 MG  TABS (AMLODIPINE BESYLATE) Take 1 tablet by mouth once a day Brand medically necessary #90 x 1   Entered by:   Lamar Sprinkles, CMA   Authorized by:   Jacques Navy MD   Signed by:   Lamar Sprinkles, CMA on 02/16/2010   Method used:   Faxed to ...       [facility]* (retail)             ,          Ph: [phone]       Fax: [phone]   RxID:   [id number]

## 2010-08-03 NOTE — Letter (Signed)
   [facility] [address] [place], [place]  [address] Phone: [phone]      May 20, 2010   Jonathan Medina [address] [place], [place] [address]  RE:  LAB RESULTS  Dear  Mr. Falkenstein,  The following is an interpretation of your most recent lab tests.  Please take note of any instructions provided or changes to medications that have resulted from your lab work.  Health professionals look at cholesterol as more involved than just the total cholesterol. We consider the level of LDL (bad) cholesterol, HDL (good), cholesterol, and Triglycerides (Grease) in the blood.  1. Your LDL should be under 100, and the HDL should be over 45, if you have any vascular disease such as heart attack, angina, stroke, TIA (mini stroke), claudication (pain in the legs when you walk due to poor circulation),  Abdominal Aortic Aneurysm (AAA), diabetes or prediabetes.  2. Your LDL should be under 130 if you have any two of the following:     a. Smoke or chew tobacco,     b. High blood pressure (if you are on medication or over 140/90 without medication),     c. Male gender,    d. HDL below 40,    e. A male relative (father, brother, or son), who have had any vascular event          as described in #1. above under the age of 10, or a male relative (mother,       sister, or daughter) who had an event as described above under age 10. (An HDL over 60 will subtract one risk factor from the total, so if you have two items in # 2 above, but an HDL over 60, you then fall into category # 3 below).  3. Your LDL should be under 160 if you have any one of the above.  Triglycerides should be under 200 with the ideal being under 150.  For diabetes or pre-diabetes, the ideal HgbA1C should be under 6.0%.  If you fall into any of the above categories, you should make a follow up appointment to discuss this with your physician.  LIPID PANEL:  Good - no changes needed Triglyceride: 175.0    Cholesterol: 197   LDL: 119   HDL: 43.50   Chol/HDL%:  5     Sincerely Yours,    Jacques Navy MD

## 2010-08-03 NOTE — Assessment & Plan Note (Signed)
Summary: SINUS AND CHEST CONGESTION/NWS   Vital Signs:  Patient profile:   77 year old male Height:      68 inches Weight:      226 pounds BMI:     34.49 O2 Sat:      95 % on Room air Temp:     98.6 degrees F oral Pulse rate:   77 / minute BP sitting:   110 / 70  (left arm) Cuff size:   large  Vitals Entered By: Bill Salinas CMA (May 17, 2010 4:46 PM)  O2 Flow:  Room air CC: ov for evaluation of head congestion, runny nose, sneezing, coughing (dry cough), chest congestion and aches and pains x 2 days/ ab   Primary Care Provider:  Norins  CC:  ov for evaluation of head congestion, runny nose, sneezing, coughing (dry cough), and chest congestion and aches and pains x 2 days/ ab.  History of Present Illness: patient reports a 24 hour history of rhinorrhea, chest and nasal congestion, myalgias. No fever, no rigors. Mucus is clear. Has tried lemon/honey/vinegar tea. No acute SOB. Sleeping was difficult due to congestion and coughing. No N/V/D.  Current Medications (verified): 1)  Flomax 0.4 Mg  Cp24 (Tamsulosin Hcl) .... Take 1 Tablet By Mouth Once A Day 2)  Fish Oil 1000 Mg  Caps (Omega-3 Fatty Acids) .... Take 1 Tablet By Mouth Once A Day 3)  Norvasc 10 Mg  Tabs (Amlodipine Besylate) .... Take 1 Tablet By Mouth Once A Day 4)  Cozaar 100 Mg Tabs (Losartan Potassium) .Marland Kitchen.. 1 By Mouth Once Daily 5)  Coreg Cr 20 Mg  Cp24 (Carvedilol Phosphate) .... Take 1 Tablet By Mouth Once A Day 6)  Klor-Con M20 20 Meq  Tbcr (Potassium Chloride Crys Cr) .... Take 1 Tablet By Mouth Once A Day 7)  Calcium Citrate-Vitamin D 1500-200 Mg-Unit  Tabs (Calcium Citrate-Vitamin D) .... Take 1 Tablet By Mouth Once A Day 8)  Cymbalta 60 Mg Cpep (Duloxetine Hcl) .Marland Kitchen.. 1 Once Daily 9)  Aspirin 81 Mg Tbec (Aspirin) .Marland Kitchen.. 1 Tab Daily  Allergies (verified): 1)  ! Sular (Nisoldipine) 2)  ! * Shellfish  Past History:  Past Medical History: Last updated: 07/11/2008 Colonic polyps, hx  of Hyperlipidemia Hypertension Benign prostatic hypertrophy Gout RUL lung nodule - S/P lung resection (low grade neuroendocrine tumor T1N0MX) 08/2007 Thyroid nodules   Past Surgical History: Last updated: 07/11/2008 Minithoracotomy - wedge of right upper lobe anterior segmental nodule.  08/2007   Family History: Last updated: 02/05/2008 father-deceased @70 : prostate cancer mother - deceased @ 48: metastatic cancer to bone, DM Neg- colon cancer; DM, CAD  Social History: Last updated: 07/11/2008  [employer]  all but finished engineering. ARmy - 18 months-[place] veteran. 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  The patient denies anorexia, fever, weight loss, weight gain, decreased hearing, hoarseness, chest pain, dyspnea on exertion, hemoptysis, abdominal pain, severe indigestion/heartburn, muscle weakness, difficulty walking, unusual weight change, angioedema, and breast masses.    Physical Exam  General:  alert, well-developed, well-nourished, well-hydrated, and normal appearance.   Head:  normocephalic and atraumatic.  No sinus tenderness. Eyes:  pupils equal, pupils round, and corneas and lenses clear.   Ears:  R ear normal and L ear normal.   Mouth:  throat  clear Neck:  supple and full ROM.   Lungs:  normal respiratory effort, no intercostal retractions, no accessory muscle use, and normal breath sounds.   Heart:  normal rate, regular rhythm, no gallop, and no JVD.   Abdomen:  soft, non-tender, normal bowel sounds, no distention, no guarding, and no rigidity.   Msk:  normal ROM and no joint tenderness.   Pulses:  2+ radial Neurologic:  alert & oriented X3, cranial nerves II-XII intact, strength normal in all extremities, and sensation intact to light touch.   Skin:  turgor normal, color normal, no  rashes, and no ulcerations.   Cervical Nodes:  no anterior cervical adenopathy and no posterior cervical adenopathy.   Psych:  Oriented X3, memory intact for recent and remote, normally interactive, and good eye contact.     Impression & Recommendations:  Problem # 1:  URI (ICD-465.9) Patient with probable viral URI. He request treatment with azithromycin which has helped him in the past.  Plan - supportive care           azithromycin 500mg  once daily x 3  His updated medication list for this problem includes:    Aspirin 81 Mg Tbec (Aspirin) .Marland Kitchen... 1 tab daily  Complete Medication List: 1)  Flomax 0.4 Mg Cp24 (Tamsulosin hcl) .... Take 1 tablet by mouth once a day 2)  Fish Oil 1000 Mg Caps (Omega-3 fatty acids) .... Take 1 tablet by mouth once a day 3)  Norvasc 10 Mg Tabs (Amlodipine besylate) .... Take 1 tablet by mouth once a day 4)  Cozaar 100 Mg Tabs (Losartan potassium) .Marland Kitchen.. 1 by mouth once daily 5)  Coreg Cr 20 Mg Cp24 (Carvedilol phosphate) .... Take 1 tablet by mouth once a day 6)  Klor-con M20 20 Meq Tbcr (Potassium chloride crys cr) .... Take 1 tablet by mouth once a day 7)  Calcium Citrate-vitamin D 1500-200 Mg-unit Tabs (Calcium citrate-vitamin d) .... Take 1 tablet by mouth once a day 8)  Cymbalta 60 Mg Cpep (Duloxetine hcl) .Marland Kitchen.. 1 once daily 9)  Aspirin 81 Mg Tbec (Aspirin) .Marland Kitchen.. 1 tab daily 10)  Azithromycin 500 Mg Tabs (Azithromycin) .Marland Kitchen.. 1 by mouth once daily x 3  Other Orders: TLB-Lipid Panel (80061-LIPID)  Patient Instructions: 1)  Upper respiratory infection - no evidence of a bacterial infection, suspect a viral infection. Plan - robitussin DM or the equivalent 1 tsp every 6 hours; throat lozenge of choice; herbal tea with honey; azithromycin 500mg  once daily x 3. Prescriptions: AZITHROMYCIN 500 MG TABS (AZITHROMYCIN) 1 by mouth once daily x 3  #6 x 0   Entered and Authorized by:   Jacques Navy MD   Signed by:   Jacques Navy MD on 05/17/2010   Method  used:   Electronically to        [facility] [address] [phone]* (retail)       [address]       [place], [place]  [address]       Ph: [phone] or [phone]       Fax: [phone]   RxID:   [id number]    Orders Added: 1)  TLB-Lipid Panel [80061-LIPID] 2)  Est. Patient Level III [36644]

## 2010-08-03 NOTE — Progress Notes (Signed)
Summary: REFERRAL  Phone Note Call from Patient Call back at Home Phone [phone]   Summary of Call: Pt psychiatrist suggested sleep study for pt. Pt suffers from PTSD and has sleeping difficulities. Pt c/o loud snoring and trouble concentrating. Will MD refer for sleep study?  Initial call taken by: Lamar Sprinkles, CMA,  April 16, 2010 11:24 AM  Follow-up for Phone Call        OK for sleep study. [facility] notified. Follow-up by: Jacques Navy MD,  April 16, 2010 1:18 PM  Additional Follow-up for Phone Call Additional follow up Details #1::        Pt informed of referral  Additional Follow-up by: Lamar Sprinkles, CMA,  April 16, 2010 2:54 PM  New Problems: SLEEP DISORDER, CHRONIC (ICD-780.50)   New Problems: SLEEP DISORDER, CHRONIC (ICD-780.50)

## 2010-08-03 NOTE — Letter (Signed)
Summary: [facility]   Imported By: Sherian Rein 02/24/2010 09:20:35  _____________________________________________________________________  External Attachment:    Type:   Image     Comment:   External Document

## 2010-08-03 NOTE — Assessment & Plan Note (Signed)
Summary: FU/ REFILLS/ SCRATCHED HIS EAR-WENT TO ER/NWS   Vital Signs:  Patient profile:   77 year old male Height:      68 inches Weight:      232 pounds BMI:     35.40 O2 Sat:      98 % on Room air Temp:     98.4 degrees F oral Pulse rate:   71 / minute BP sitting:   132 / 82  (left arm) Cuff size:   large  Vitals Entered By: Bill Salinas CMA (September 23, 2009 3:21 PM)  O2 Flow:  Room air CC: pt here for ER follow up on abrasion of right ear/ ab   Primary Care Provider:  Marceline Napierala  CC:  pt here for ER follow up on abrasion of right ear/ ab.  History of Present Illness: Patient feels better - he is on the mend. For two weeks he has been coughng and sneezing. He did have some bleeding from his right ear and went to the ED.All records and labs reveiwed.  He was found to have an abrasion on the floor of the EAC. PA Hunt irrigated with perioxide and water. She provided a Rx for cipro otic and told the patient "if it was too expensive that he didn't need to fill the Rx." He mailed the Rx to [facility] which has not yet been delivered. He has not had any further bleeding.   Question about cozaar: no GI problems, no dizziness. He does feel tired. (check ePocreates and fatigue is a [place] adverse effect).  Current Medications (verified): 1)  Flomax 0.4 Mg  Cp24 (Tamsulosin Hcl) .... Take 1 Tablet By Mouth Once A Day 2)  Fish Oil 1000 Mg  Caps (Omega-3 Fatty Acids) .... Take 1 Tablet By Mouth Once A Day 3)  Norvasc 10 Mg  Tabs (Amlodipine Besylate) .... Take 1 Tablet By Mouth Once A Day 4)  Cozaar 100 Mg Tabs (Losartan Potassium) .Marland Kitchen.. 1 By Mouth Once Daily 5)  Coreg Cr 20 Mg  Cp24 (Carvedilol Phosphate) .... Take 1 Tablet By Mouth Once A Day 6)  Klor-Con M20 20 Meq  Tbcr (Potassium Chloride Crys Cr) .... Take 1 Tablet By Mouth Once A Day 7)  Calcium Citrate-Vitamin D 1500-200 Mg-Unit  Tabs (Calcium Citrate-Vitamin D) .... Take 1 Tablet By Mouth Once A Day  Allergies (verified): 1)  ! 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  08/2007   Family History: Last updated: 02-03-2008 father-deceased @70 : prostate cancer mother - deceased @ 76: metastatic cancer to bone, DM Neg- colon cancer; DM, CAD  Social History: Last updated: 07/11/2008  [employer]  all but finished engineering. ARmy - 18 months-[place] veteran. 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  Physical Exam  General:  alert, well-developed, and well-nourished.   Head:  Normocephalic and atraumatic without obvious abnormalities. No apparent alopecia or balding. Ears:  minimal eschar inferior outer aspect of right EAC Lungs:  normal respiratory effort, normal breath sounds, no crackles, and no wheezes.   Heart:  normal rate and regular rhythm.     Impression & Recommendations:  Problem # 1:  INJURY OF FACE AND NECK OTHER AND UNSPECIFIED (ICD-959.09) Patient with an abrasionof the right EAC from using an implement in the canal for crumen/itch.  Plan- avoid further injury  (nothing sharper than the elbow in the ear)          will not need to use cipro otic  Problem # 2:  HYPERTENSION (ICD-401.9)  His updated medication list for this problem includes:    Norvasc 10 Mg Tabs (Amlodipine besylate) .Marland Kitchen... Take 1 tablet by mouth once a day    Cozaar 100 Mg Tabs (Losartan potassium) .Marland Kitchen... 1 by mouth once daily    Coreg Cr 20 Mg Cp24 (Carvedilol phosphate) .Marland Kitchen... Take 1 tablet by mouth once a day  BP today: 132/82 Prior BP: 138/78 (05/14/2009)  Good BP control. Minor symptoms. No change in medication at this time.  Complete Medication List: 1)  Flomax 0.4 Mg Cp24 (Tamsulosin hcl) .... Take 1 tablet by mouth once a day 2)  Fish Oil 1000 Mg Caps (Omega-3 fatty acids) .... Take 1 tablet by mouth once a day 3)  Norvasc 10 Mg Tabs (Amlodipine besylate) .... Take 1 tablet by mouth once a day 4)  Cozaar 100 Mg Tabs (Losartan potassium) .Marland Kitchen.. 1 by mouth once daily 5)  Coreg Cr 20 Mg Cp24 (Carvedilol phosphate) .... Take 1 tablet by mouth once a day 6)  Klor-con M20 20 Meq Tbcr (Potassium chloride crys cr) .... Take 1 tablet by mouth once a day 7)  Calcium Citrate-vitamin D 1500-200 Mg-unit Tabs (Calcium citrate-vitamin d) .... Take 1 tablet by mouth once a day

## 2010-08-03 NOTE — Procedures (Signed)
Summary: Colonoscopy/[facility]  Colonoscopy/[facility]   Imported By: Sherian Rein 08/12/2009 13:33:04  _____________________________________________________________________  External Attachment:    Type:   Image     Comment:   External Document

## 2010-08-03 NOTE — Medication Information (Signed)
Summary: Norvasc / [facility]  Norvasc / [facility]   Imported By: Lennie Odor 10/13/2009 15:19:36  _____________________________________________________________________  External Attachment:    Type:   Image     Comment:   External Document

## 2010-08-03 NOTE — Letter (Signed)
Summary: [facility] @ [facility] @ [place]   Imported By: Sherian Rein 06/04/2010 10:36:15  _____________________________________________________________________  External Attachment:    Type:   Image     Comment:   External Document

## 2010-08-03 NOTE — Progress Notes (Signed)
  Phone Note Other Incoming   Request: Send information Summary of Call: Records received from [facility] @ [facility] (2 Pages)  forwarded to Dr. Debby Bud.

## 2010-08-04 ENCOUNTER — Encounter: Payer: Self-pay | Admitting: Pulmonary Disease

## 2010-08-05 ENCOUNTER — Ambulatory Visit: Payer: Self-pay | Admitting: Internal Medicine

## 2010-08-05 ENCOUNTER — Ambulatory Visit (INDEPENDENT_AMBULATORY_CARE_PROVIDER_SITE_OTHER): Payer: Medicare Other | Admitting: Pulmonary Disease

## 2010-08-05 ENCOUNTER — Encounter: Payer: Self-pay | Admitting: Pulmonary Disease

## 2010-08-05 ENCOUNTER — Ambulatory Visit: Admit: 2010-08-05 | Payer: Self-pay | Admitting: Pulmonary Disease

## 2010-08-05 DIAGNOSIS — G4730 Sleep apnea, unspecified: Secondary | ICD-10-CM

## 2010-08-05 NOTE — Letter (Signed)
Summary: CPAP Supplies/[facility]  CPAP Supplies/[facility]   Imported By: Sherian Rein 07/29/2010 14:57:29  _____________________________________________________________________  External Attachment:    Type:   Image     Comment:   External Document

## 2010-08-05 NOTE — Letter (Signed)
Summary: Nicki Reaper MD/[facility]  Nicki Reaper MD/[facility]   Imported By: Lester Roselle 07/23/2010 09:01:37  _____________________________________________________________________  External Attachment:    Type:   Image     Comment:   External Document

## 2010-08-05 NOTE — Assessment & Plan Note (Signed)
Summary: per dr norins/chronic sleep disorder/mhh   Visit Type:  Initial Consult Copy to:  pcp Primary Provider/Referring Provider:  Norins  CC:  Sleep consult.  History of Present Illness: 77/M, [name], ex smoker for evaluation of sleep disordered breathing. He does have PTSd & takes cymbalta daytime for many years. he also has long standing insomnia of sleep initiation & maintenance, since his youth. 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 Wife reports unplanned daytime naps, sometimes in bed x 1-2 h. Bedtime is around midnight, latency upto an hr, multiple awakenings , nocturia +, pain in legs inspite of neurontin, oob as late as 0930, 'tired', no headaches. Nocturnal PSg in dec'11 showed poor sleep efficiency with TST of 197 mins, AHI 12/h , desatn < 88% x 26 mins & lowest d esaturation to 76%. BMI was 35 Epworth Sleepiness Score is 4/24 - but I think he is underestimating.  Preventive Screening-Counseling & Management  Alcohol-Tobacco     Alcohol drinks/day: 0     Smoking Status: quit     Year Quit: 2000     Pack years: 30+ pack years   History of Present Illness: ?Sleep apnea  What time do you typically go to bed?(between what hours): 12 midnight  How long does it take you to fall asleep? varies  How many times during the night do you wake up? multiple  What time do you get out of bed to start your day? varies, 9:30-10am  Do you drive or operate heavy machinery in your occupation? n/a  How much has your weight changed (up or down) over the past two years? (in pounds): -10 lbs  Have you ever had a sleep study before?  If yes,when and where: yes 06/04/10  Do you currently use CPAP ? If so , at what pressure? no  Do you wear oxygen at any time? If yes, how many liters per minute? no Current Medications (verified): 1)  Flomax 0.4 Mg  Cp24 (Tamsulosin Hcl) .... Take 1 Tablet By Mouth Once A  Day 2)  Fish Oil 1000 Mg  Caps (Omega-3 Fatty Acids) .... Take 1 Tablet By Mouth Once A Day 3)  Norvasc 10 Mg  Tabs (Amlodipine Besylate) .... Take 1 Tablet By Mouth Once A Day 4)  Cozaar 100 Mg Tabs (Losartan Potassium) .Marland Kitchen.. 1 By Mouth Once Daily 5)  Coreg Cr 20 Mg  Cp24 (Carvedilol Phosphate) .... Take 1 Tablet By Mouth Once A Day 6)  Klor-Con M20 20 Meq  Tbcr (Potassium Chloride Crys Cr) .... Take 1 Tablet By Mouth Once A Day 7)  Calcium Citrate-Vitamin D 1500-200 Mg-Unit  Tabs (Calcium Citrate-Vitamin D) .... Take 1 Tablet By Mouth Once A Day 8)  Cymbalta 60 Mg Cpep (Duloxetine Hcl) .Marland Kitchen.. 1 Once Daily 9)  Aspirin 81 Mg Tbec (Aspirin) .Marland Kitchen.. 1 Tab Daily 10)  Gabapentin 100 Mg Caps (Gabapentin) .... Take 1 Tablet By Mouth Three Times A Day  Allergies (verified): 1)  ! Sular (Nisoldipine) 2)  ! Iodine 3)  ! Simvastatin (Simvastatin) 4)  ! 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  08/2007   Family History:  Last updated: 01/22/2008 father-deceased @77 : prostate cancer mother - deceased @ 36: metastatic cancer to bone, DM Neg- colon cancer; DM, CAD  Social History: Last updated: 07/11/2008  [employer]  all but finished engineering. ARmy - 18 months-[place] veteran. 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  The patient denies shortness  of breath with activity, shortness of breath at rest, productive cough, non-productive cough, coughing up blood, chest pain, irregular heartbeats, acid heartburn, indigestion, loss of appetite, abdominal pain, difficulty swallowing, sore throat, tooth/dental problems, headaches, sneezing, ear ache, hand/feet swelling, change in color of mucus, and fever.    Vital Signs:  Patient profile:   77 year old male Height:      68 inches Weight:      231.2 pounds BMI:     35.28 O2 Sat:      99 % on Room air Temp:     98.0 degrees F oral Pulse rate:   75 / minute BP sitting:   110 / 66  (right arm) Cuff size:   large  Vitals Entered By: Zackery Barefoot CMA (June 30, 2010 3:41 PM)  O2 Flow:  Room air CC: Sleep consult Comments Medications reviewed with patient Verified contact number and pharmacy with patient Zackery Barefoot [facility]  June 30, 2010 3:41 PM    Physical Exam  Additional Exam:  wt 231 July 01, 2010 Gen. Pleasant, well-nourished, in no distress, normal affect ENT - no lesions, no post nasal drip, class 2 airway Neck: No JVD, no thyromegaly, no carotid bruits Lungs: no use of accessory muscles, no dullness to percussion, clear without rales or rhonchi  Cardiovascular: Rhythm regular, heart sounds  normal, no murmurs or gallops, no peripheral edema Abdomen: soft and non-tender, no hepatosplenomegaly, BS normal. Musculoskeletal: No deformities, no cyanosis or clubbing Neuro:  alert, non focal     Impression & Recommendations:  Problem # 1:  OBSTRUCTIVE SLEEP APNEA (ICD-780.57) The pathophysiology of obstructive sleep apnea, it's cardiovascular consequences and modes of treatment including CPAP were discussed with the patient in great detail.  We will initiate Rx with auto cpap 5-12 cm, humidity & chk download in 4 wks Orders: DME Referral (DME) Consultation Level IV (16109)  Problem # 2:  SLEEP DISORDER, CHRONIC (ICD-780.50)  Insomnia & 'shift work'  disorder Doubt we will be able to impact this much He is opposed to sleep altering medications & I agree Best to focus on behavioural strategies here - perhaps trial of melatonin in the future.  Orders: Consultation Level IV (60454)  Medications Added to Medication List This Visit: 1)  Gabapentin 100 Mg Caps (Gabapentin) .... Take 1 tablet by mouth three times a day  Patient Instructions: 1)  Copy sent to: dr Debby Bud 2)  Please schedule a follow-up appointment in 1 month. 3)  obstructive sleep apnea will be treated with a cpap machine- Rx has been sent to supply company 4)  Send in the card in 1 month

## 2010-08-05 NOTE — Miscellaneous (Signed)
Summary: Orders Update  Clinical Lists Changes  Orders: Added new Referral order of Sleep Disorder Referral (Sleep Disorder) - Signed 

## 2010-08-05 NOTE — Letter (Signed)
Summary: [facility]   Imported By: Sherian Rein 07/20/2010 10:08:33  _____________________________________________________________________  External Attachment:    Type:   Image     Comment:   External Document

## 2010-08-11 NOTE — Assessment & Plan Note (Addendum)
Summary: 5 wk return/mhh   Vital Signs:  Patient profile:   77 year old male Height:      68 inches Weight:      231.38 pounds BMI:     35.31 O2 Sat:      97 % on Room air Temp:     97.9 degrees F oral Pulse rate:   75 / minute BP sitting:   150 / 80  (left arm) Cuff size:   large  Vitals Entered By: Arman Filter LPN (August 05, 2010 1:38 PM)  O2 Flow:  Room air CC: States he got a new mask 3 days ago and is tolerating new mask well.  Denies any mask leaks.  Denies any complaints with pressure.  Comments Medications reviewed with patient Arman Filter LPN  August 05, 2010 1:42 PM    Visit Type:  Follow-up Copy to:  pcp Primary Provider/Referring Provider:  Norins  CC:  States he got a new mask 3 days ago and is tolerating new mask well.  Denies any mask leaks.  Denies any complaints with pressure. Marland Kitchen  History of Present Illness: [name], [name], ex smoker for evaluation of sleep disordered breathing. He does have PTSd & takes cymbalta daytime for many years. he also has long standing insomnia of sleep initiation & maintenance, since his youth. 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 Wife reports unplanned daytime naps, sometimes in bed x 1-2 h. Bedtime is around midnight, latency upto an hr, multiple awakenings , nocturia +, pain in legs inspite of neurontin, oob as late as 0930, 'tired', no headaches. Nocturnal PSg in dec'11 showed poor sleep efficiency with TST of 197 mins, AHI 12/h , desatn < 88% x 26 mins & lowest d esaturation to 76%. BMI was 35 Epworth Sleepiness Score is 4/24 - but I think he is underestimating.  August 05, 2010 1:48 PM  changed from full face mask to nasal pillows 3 days ago and is tolerating new interface well.  Denies any mask leaks.  Denies any complaints with pressure. Using 4 h/ night  Medications Prior to Update: 1)  Flomax 0.4 Mg  Cp24 (Tamsulosin Hcl) .... Take  1 Tablet By Mouth Once A Day 2)  Fish Oil 1000 Mg  Caps (Omega-3 Fatty Acids) .... Take 1 Tablet By Mouth Once A Day 3)  Norvasc 10 Mg  Tabs (Amlodipine Besylate) .... Take 1 Tablet By Mouth Once A Day 4)  Cozaar 100 Mg Tabs (Losartan Potassium) .Marland Kitchen.. 1 By Mouth Once Daily 5)  Coreg Cr 20 Mg  Cp24 (Carvedilol Phosphate) .... Take 1 Tablet By Mouth Once A Day 6)  Klor-Con M20 20 Meq  Tbcr (Potassium Chloride Crys Cr) .... Take 1 Tablet By Mouth Once A Day 7)  Calcium Citrate-Vitamin D 1500-200 Mg-Unit  Tabs (Calcium Citrate-Vitamin D) .... Take 1 Tablet By Mouth Once A Day 8)  Cymbalta 60 Mg Cpep (Duloxetine Hcl) .Marland Kitchen.. 1 Once Daily 9)  Aspirin 81 Mg Tbec (Aspirin) .Marland Kitchen.. 1 Tab Daily 10)  Gabapentin 100 Mg Caps (Gabapentin) .... Take 1 Tablet By Mouth Three Times A Day  Allergies (verified): 1)  ! Sular (Nisoldipine) 2)  ! Iodine 3)  ! Simvastatin (Simvastatin) 4)  ! * Shellfish  Past History:  Past Medical History: Last updated: 07/11/2008 Colonic polyps, hx of Hyperlipidemia Hypertension Benign prostatic hypertrophy Gout RUL lung nodule - S/P lung resection (  low grade neuroendocrine tumor [id number]) 08/2007 Thyroid nodules   Social History: Last updated: 07/11/2008  [employer]  all but finished engineering. ARmy - 18 months-[place] veteran. 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    Physical Exam  Additional Exam:  wt 231 July 01, 2010 Gen.  Pleasant, well-nourished, in no distress, normal affect ENT - no lesions, no post nasal drip, class 2 airway Neck: No JVD, no thyromegaly, no carotid bruits Lungs: no use of accessory muscles, no dullness to percussion, clear without rales or rhonchi  Cardiovascular: Rhythm regular, heart sounds  normal, no murmurs or gallops, no peripheral edema Musculoskeletal: No deformities, no cyanosis or clubbing      Impression & Recommendations:  Problem # 1:  OBSTRUCTIVE SLEEP APNEA (ICD-780.57) Change to 10 cm pressure & download in 1 month Compliance encouraged, wt loss emphasized, asked to avoid meds with sedative side effects, cautioned against driving when sleepy.  Ct use of nasal pillows He will try to increase usage  Orders: DME Referral (DME) Est. Patient Level III (16109)  Problem # 2:  SLEEP DISORDER, CHRONIC (ICD-780.50)  Insomnia & 'shift work' disorder Doubt we will be able to impact this much He is opposed to sleep altering medications & I agree Best to focus on behavioural strategies here - perhaps trial of melatonin in the future.  Patient Instructions: 1)  Copy sent to: 2)  Please schedule a follow-up appointment in 2 months. 3)  Change to fixed pressure 10 cm 4)  Try to use CPAP for the  entire night   Orders Added: 1)  DME Referral [DME] 2)  Est. Patient Level III [60454]  Appended Document: 5 wk return/mhh download shows good control of events on CPAP, compliance is good 4h avg but can he increase this & use longer ? he will have greter benefit if he does.   Appended Document: 5 wk return/mhh pt informed. pt was scheduled to see Dr. Sherene Sires in April. I transferred pt to the "que" to have him change appointment to [facility]

## 2010-08-11 NOTE — Letter (Signed)
Summary: Nicki Reaper MD/[facility]  Nicki Reaper MD/[facility]   Imported By: Lester [place] 08/03/2010 09:28:46  _____________________________________________________________________  External Attachment:    Type:   Image     Comment:   External Document

## 2010-08-18 ENCOUNTER — Encounter: Payer: Self-pay | Admitting: Pulmonary Disease

## 2010-08-23 NOTE — Op Note (Signed)
NAMEJACOBB, Jonathan Medina               ACCOUNT NO.:  [id number]  MEDICAL RECORD NO.:  [id number]          PATIENT TYPE:  AMB  LOCATION:  DSC                          FACILITY:  [facility]  PHYSICIAN:  Cindee Salt, M.D.       DATE OF BIRTH:  1929-11-06  DATE OF PROCEDURE:  06/23/2010 DATE OF DISCHARGE:                              OPERATIVE REPORT   PREOPERATIVE DIAGNOSIS:  Intra-articular comminuted fracture, base fifth metacarpal, left hand.  POSTOPERATIVE DIAGNOSIS:  Intra-articular comminuted fracture, base fifth metacarpal, left hand.  OPERATION:  Open reduction and internal fixation, intra-articular comminuted base fracture, left hand.  SURGEON:  Cindee Salt, MD  ANESTHESIA:  General.  ANESTHESIOLOGIST:  Dr. Jean Rosenthal.  HISTORY:  The patient is a 77 year old male, who suffered a fracture through the fifth metacarpal base, intra-articular in nature.  This is approximately 53 days old.  He did not seek medical attention initially. He arise with a swollen hand.  This was delayed temporarily for elevation in an attempt to diminish the amount of swelling present. Pre, peri, and postoperative course have been discussed along with risks and complications including the possibility of infection; recurrence of injury to arteries, nerves, tendons; incomplete relief of symptoms; dystrophy; nonunion; arthritis; possibility of fusion at the base.  In the preoperative area, the patient is seen.  The extremity marked by both the patient and surgeon.  Antibiotic given.  PROCEDURE IN DETAIL:  The patient was brought to the operating room where a general anesthetic was carried out without difficulty.  He was prepped using ChloraPrep, supine position, left arm free.  A 3-minute dry time was allowed.  Time-out taken confirming the patient and the procedure.  Following adequate anesthesia, the limb was exsanguinated with an Esmarch bandage.  Tourniquet placed high and the arm was inflated to 150  mmHg.  A straight incision was made over the base of the fifth metacarpal, carried down through subcutaneous tissue.  Bleeders were electrocauterized.  The dissection was carried down to the ulnar aspect of the extensor tendon to the fifth finger.  The insertion of the extensor carpi ulnaris was identified.  The dorsal sensory branch of the ulnar nerve was identified.  An incision was made in the periosteum and the joint opened.  A markedly comminuted intra-articular fracture of the fifth metacarpal base was immediately apparent.  This was gently freed with the impacted portion.  The articular surface was visualized.  This was in multiple pieces.  It was decided that a simple pinning would be the only viable alternative.  The area was debrided.  The finger placed under traction and a cross pin with 0.45 K-wire was done from the little to the ring finger with the metacarpal in traction and the finger fully flexed to provide rotation.  This provided longitudinal stability.  The fracture fragments were then aligned.  A 0.35 K-wire was then passed through the metacarpal base into the fracture fragment, intra-articular in nature and into the hamate.  A second was placed through the base into the second large fragment on the radial aspect.  X-rays confirmed that the articular surface was reasonably well  aligned with the metacarpal back out to length.  The wound was copiously irrigated with saline.  The periosteum was closed with figure-of-eight 4-0 Vicryl sutures.  The subcutaneous tissue with interrupted 4-0 Vicryl.  The skin with interrupted 4-0 Vicryl Rapide sutures.  Local infiltration with 0.25% Marcaine without epinephrine was then given, 5 mL was used. Sterile compressive dressing, ulnar gutter splint was applied.  On deflation of the tourniquet, all fingers immediately pinked.  He was taken to the recovery room for observation in satisfactory condition.  He will be discharged to home to  return to the [facility] [place] in 1 week on Vicodin.          ______________________________ Cindee Salt, M.D.     GK/MEDQ  D:  06/23/2010  T:  06/24/2010  Job:  [id number]  cc:   Rosalyn Gess. Norins, MD  Electronically Signed by Cindee Salt M.D. on 08/23/2010 02:20:53 PM

## 2010-08-31 ENCOUNTER — Encounter: Payer: Self-pay | Admitting: Pulmonary Disease

## 2010-08-31 ENCOUNTER — Ambulatory Visit (INDEPENDENT_AMBULATORY_CARE_PROVIDER_SITE_OTHER): Payer: Medicare Other | Admitting: Thoracic Surgery

## 2010-08-31 ENCOUNTER — Ambulatory Visit
Admission: RE | Admit: 2010-08-31 | Discharge: 2010-08-31 | Disposition: A | Discharge status: Home / Self Care | Payer: Medicare Other | Source: Ambulatory Visit | Attending: Thoracic Surgery | Admitting: Thoracic Surgery

## 2010-08-31 DIAGNOSIS — R911 Solitary pulmonary nodule: Secondary | ICD-10-CM

## 2010-08-31 DIAGNOSIS — C3410 Malignant neoplasm of upper lobe, unspecified bronchus or lung: Secondary | ICD-10-CM

## 2010-08-31 NOTE — Assessment & Plan Note (Signed)
OFFICE VISIT  [name], Jonathan Medina DOB:  Apr 03, 1930                                        August 31, 2010 CHART #:  [id number]  [name] returns today 3 years after having a low-grade neuroendocrine tumor removed from his right upper lobe.  CT scan showed no evidence of recurrence and no adenopathy.  He is doing well overall.  I will release him back, to be followed by Dr. Vassie Loll, and see me.  His blood pressure is 133/77, pulse 74, respirations 16, and sats were 94%.  [name], M.D. Electronically Signed  DPB/MEDQ  D:  08/31/2010  T:  08/31/2010  Job:  [id number]  cc:   Oretha Milch, MD

## 2010-09-07 ENCOUNTER — Encounter: Payer: Self-pay | Admitting: Pulmonary Disease

## 2010-09-13 LAB — BASIC METABOLIC PANEL
Chloride: 102 mEq/L (ref 96–112)
GFR calc Af Amer: >60 mL/min (ref >60)
GFR calc non Af Amer: >60 mL/min (ref >60)
Potassium: 4.3 mEq/L (ref 3.5–5.1)
Sodium: 139 mEq/L (ref 135–145)

## 2010-09-13 LAB — POCT HEMOGLOBIN-HEMACUE: Hemoglobin: 12.4 g/dL — ABNORMAL LOW (ref 13.0–17.0)

## 2010-09-21 NOTE — Letter (Signed)
Summary: [facility]  [facility]   Imported By: Lennie Odor 09/16/2010 11:59:14  _____________________________________________________________________  External Attachment:    Type:   Image     Comment:   External Document

## 2010-09-29 ENCOUNTER — Encounter: Payer: Self-pay | Admitting: Pulmonary Disease

## 2010-10-04 ENCOUNTER — Ambulatory Visit: Payer: Medicare Other | Admitting: Internal Medicine

## 2010-10-04 ENCOUNTER — Ambulatory Visit (INDEPENDENT_AMBULATORY_CARE_PROVIDER_SITE_OTHER): Payer: Medicare Other | Admitting: Pulmonary Disease

## 2010-10-04 ENCOUNTER — Encounter: Payer: Self-pay | Admitting: Pulmonary Disease

## 2010-10-04 VITALS — BP 124/66 | HR 83 | Temp 98.3°F | Ht 68.0 in | Wt 230.2 lb

## 2010-10-04 DIAGNOSIS — G4730 Sleep apnea, unspecified: Secondary | ICD-10-CM

## 2010-10-04 NOTE — Progress Notes (Signed)
  Subjective:    Patient ID: Jonathan Medina, male    DOB: 1930/05/05, 77 y.o.   MRN: [id number]  HPI78/M, [name], ex smoker for FU of obstructive sleep apnea & chronic insomnia/ shift work disorder He does have PTSd & takes cymbalta daytime for many years. he also has long standing insomnia of sleep initiation & maintenance, since his youth. 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 Wife reports unplanned daytime naps, sometimes in bed x 1-2 h. Bedtime is around midnight, latency upto an hr, multiple awakenings , nocturia +, pain in legs inspite of neurontin, oob as late as 0930, 'tired', no headaches.  Nocturnal PSg in dec'11 showed poor sleep efficiency with TST of 197 mins, AHI 12/h , desatn < 88% x 26 mins & lowest d esaturation to 76%. BMI was 35  Epworth Sleepiness Score is 4/24 - but I think he is underestimating.   August 05, 2010 1:48 PM  changed from full face mask to nasal pillows 3 days ago and is tolerating new interface well. Using 4 h/ night >> canged to 10 cm, trial of melatonin  10/04/2010 Download 2/8-09/07/10 >> 5 h compliance, 10 cm OK New face mask helped, raised humidifier setting for dry mouth Still having nightmares, no leak Download 3/1-3/30/12 >> 4.5 h, no events     Review of Systems Denies any mask leaks. Denies any complaints with pressure.  Pt denies any significant  nasal congestion or excess secretions, fever, chills, sweats, unintended wt loss, pleuritic or exertional cp, orthopnea pnd or leg swelling.  Pt also denies any obvious fluctuation in symptoms with weather or environmental change or other alleviating or aggravating factors.    Pt denies any increase in rescue therapy over baseline, denies waking up needing it or having early am exacerbations or coughing/wheezing/ or dyspnea       Objective:   Physical Exam Gen. Pleasant, well-nourished, in no distress ENT - no lesions, no  post nasal drip Neck: No JVD, no thyromegaly, no carotid bruits Lungs: no use of accessory muscles, no dullness to percussion, clear without rales or rhonchi  Cardiovascular: Rhythm regular, heart sounds  normal, no murmurs or gallops, no peripheral edema Musculoskeletal: No deformities, no cyanosis or clubbing          Assessment & Plan:

## 2010-10-04 NOTE — Patient Instructions (Signed)
Stay on your CPAP machine - Aim for at least 6h every night Your machine is set at 10 cm

## 2010-10-07 NOTE — Assessment & Plan Note (Signed)
Much improved on cpap 10 cm Weight loss encouraged, compliance with goal of at least 4-6 hrs every night is the expectation. Advised against medications with sedative side effects Cautioned against driving when sleepy - understanding that sleepiness will vary on a day to day basis

## 2010-10-15 ENCOUNTER — Other Ambulatory Visit: Payer: Self-pay | Admitting: *Deleted

## 2010-10-15 MED ORDER — AMLODIPINE BESYLATE 10 MG PO TABS: 10.0000 mg | ORAL_TABLET | Freq: Every day | ORAL | Status: DC

## 2010-10-25 ENCOUNTER — Other Ambulatory Visit: Payer: Self-pay | Admitting: *Deleted

## 2010-10-25 MED ORDER — CARVEDILOL PHOSPHATE ER 20 MG PO CP24: 20.0000 mg | ORAL_CAPSULE | Freq: Every day | ORAL | Status: DC

## 2010-10-27 ENCOUNTER — Encounter: Payer: Self-pay | Admitting: Pulmonary Disease

## 2010-11-16 NOTE — Op Note (Signed)
NAMEJHETT, Jonathan Medina               ACCOUNT NO.:  [id number]   MEDICAL RECORD NO.:  [id number]          PATIENT TYPE:  AMB   LOCATION:  DAY                          FACILITY:  [facility]   PHYSICIAN:  Maretta Bees. Vonita Moss, M.D.DATE OF BIRTH:  02-04-1930   DATE OF PROCEDURE:  02/14/2007  DATE OF DISCHARGE:                               OPERATIVE REPORT   PREOPERATIVE DIAGNOSIS:  Symptomatic left spermatocele.   POSTOPERATIVE DIAGNOSIS:  Symptomatic left spermatocele.   PROCEDURE:  Left spermatocelectomy.   SURGEON:  Dr. Larey Dresser.   ANESTHESIA:  General.   INDICATIONS:  This 77 year old gentleman has had a left spermatocele  that was becoming more uncomfortable and he says it hurts when he walks  around and would like it removed.  I advised him about the operative  procedure and risk of bleeding, infection, the fortunately low risk of  recurrence.   PROCEDURE:  The patient brought to the operating room and placed supine  position.  External genitalia were prepped and draped in usual fashion.  A left transverse scrotal incision was made to deliver the left testicle  and left spermatocele into the operative field.  The tunica vaginalis of  the testicle was opened up to prevent hydrocele formation in the future  and subsequently sutured behind the back of the testicle.  A  spermatocele was size of his testicle was then dissected out and clamped  at its base and removed totally.  The base of the hydrocele was sewn  over with running 3-0 chromic catgut.  Several small bleeders were  controlled with electrocautery coagulation.  The wound was irrigated  with sterile saline.  With good hemostasis the testicle was placed back  in the scrotum and interrupted 3-0 chromic catgut was placed as a deep  closure and scrotal skin closed with running 3-0 chromic catgut with a  couple of interrupted and supporting 3-0 catgut sutures.  The wound was  cleaned, dressed with dry sterile gauze dressings.   He was taken to  recovery room in good condition having tolerated the procedure well      Maretta Bees. Vonita Moss, M.D.  Electronically Signed     LJP/MEDQ  D:  02/14/2007  T:  02/15/2007  Job:  [id number]   cc:   Dr. Madison Hickman B. Sherene Sires, MD, FCCP  [address]  [place] [address]

## 2010-11-16 NOTE — Assessment & Plan Note (Signed)
OFFICE VISIT   Jonathan Medina, Jonathan Medina  DOB:  1929-12-13                                        August 20, 2008  CHART #:  [id number]   The patient came today.  His blood pressure was 148/82, pulse 72,  respirations 18, and sats were 92%.  Lungs were clear to auscultation  and percussion.  When we examined him, his heart was regular sinus  rhythm.  He is doing well overall.  His only medical problem is he  continued to have problems with double vision for which he is seeing a  neurologist as well as Dr. Alden Hipp.  Also, we reviewed his CT scan.  There  is no evidence of recurrence of his carcinoid tumor in 1 year  postsurgery.  We will see him again in 6 months with a chest x-ray.   [name], M.D.  Electronically Signed   DPB/MEDQ  D:  08/20/2008  T:  08/20/2008  Job:  [id number]

## 2010-11-16 NOTE — Assessment & Plan Note (Signed)
OFFICE VISIT   [name], [name]  DOB:  July 31, 1929                                        October 18, 2007  CHART #:  [id number]   SUBJECTIVE:  Jonathan Medina came in today.  He is now four months from  his surgery.  He is doing well overall.  His chest x-ray was stable.  His blood pressure is 10/80, pulse 80, respirations 18, saturation 92%.   We will see him back in four more months, with a chest x-ray.   [name], M.D.  Electronically Signed   DPB/MEDQ  D:  10/18/2007  T:  10/18/2007  Job:  [id number]

## 2010-11-16 NOTE — H&P (Signed)
NAMEJANCARLOS, [name]:  [id number]   MEDICAL RECORD NO.:  [id number]          PATIENT TYPE:  INP   LOCATION:  NA                           FACILITY:  [facility]   PHYSICIAN:  [name], M.D. DATE OF BIRTH:  Apr 30, 1930   DATE OF ADMISSION:  DATE OF DISCHARGE:                              HISTORY & PHYSICAL   HISTORY OF PRESENT ILLNESS:  This is a58 year old African American male  who quit smoking in June 2000.  He was found to have a 7.8 nodule in the  right middle lobe.  A repeat CT done recently showed that this has  increased from 7.8 mm to 10 mm.  His pulmonary function tests showed an  FVC of 2.77, an FEV1 of 1.87, and a DLCO of 69%.  He has had no fever,  chills, excessive sputum, weight loss, or hemoptysis.  A PET scan was  done which showed no uptake of the right upper lobe lesion but had  showed low metabolic activity in his right middle lobe lesion such could  not exclude it was a low metabolic active neoplasm.   PAST MEDICAL HISTORY:   MEDICATIONS:  1. Coreg 20 a day.  2. He had recently stopped Altace and hydrochlorothiazide because of      gout.  3. Cozaar 50 mg a day.  4. Flomax 0.4 mg daily for benign prostatic hypertrophy.  5. Norvasc 10 mg a day for hypertension.  6. Testosterone 100 mg daily for gynecomastia.  7. Potassium chloride 20 mg a day.  8. Calcium.  9. Omega-3 oil.  10.Allopurinol for gout.  11.Colchicine 0.6 g p.r.n. for gout.   HE IS ALLERGIC TO MEDICATIONS CONTAINING IODINE WHICH CAUSED HIM  SWELLING.   Otherwise, his past history is noncontributory.   SOCIAL HISTORY:  He is married and has 4 children.  Quit smoking in  2000, does not drink alcohol on a regular basis.   FAMILY HISTORY:  Noncontributory.   REVIEW OF SYSTEMS:  He has had some recent weight gain.  He is 230  pounds.  He is 5 foot 10.  CARDIAC:  No angina or atrial fibrillation.  PULMONARY:  See history of present illness.  GI:  No nausea, vomiting,  constipation, diarrhea, or GERD.  GU:  See past medical history.  Frequent urination.  VASCULAR:  No claudication, TIAs, DVT.  He did have  a Bell palsy in June 2008 but this resolved.  NEUROLOGICAL:  No  headaches, blackouts, seizures, or dizziness.  MUSCULOSKELETAL:  Arthritis and gout.  PSYCHIATRIC:  No nervousness or depression.  EYE/ENT:  No changes in eyesight or hearing.  HEMATOLOGICAL:  No  problems with bleeding/clotting disorders or anemia.   PHYSICAL EXAM:  He is a well-developed [place] American male in no acute  distress.  His blood pressure is 129/83.  Pulse 80.  Respirations 18.  Sats were 94%.  HEAD:  Atraumatic.  EYES:  Pupils equal and reactive to light and accommodation.  Extraocular movements are normal.  EARS:  Tympanic membranes are intact.  NOSE:  There is no septal deviation.  THROAT:  Without lesions.  NECK:  Supple without thyromegaly.  There is no supraclavicular or  axillary adenopathy.  CHEST:  Clear to auscultation and percussion.  HEART:  Regular sinus rhythm.  No murmurs.  ABDOMEN:  Soft.  There is no hepatosplenomegaly.  Pulses 2+.  There is  no clubbing or edema.  NEUROLOGICAL:  He is oriented x3.  Sensory and motor intact.  Cranial  nerves intact.   IMPRESSION:  1. Enlarging right middle lobe lesion.  Rule out nonsmall cell lung      cancer or carcinoid.  2. History of tobacco abuse.  3. Hypertension.  4. Benign prostatic hypertrophy.  5. Gout.  6. Hypertension.  7. Gynecomastia.      [name], M.D.  Electronically Signed     DPB/MEDQ  D:  08/07/2007  T:  08/08/2007  Job:  [id number]

## 2010-11-16 NOTE — Assessment & Plan Note (Signed)
OFFICE VISIT   Jonathan Medina, Jonathan Medina  DOB:  [birth year]                                        February 25, 2009  CHART #:  [id number]   The patient came for followup today.  His chest x-ray is stable.  No  recurrence of any other nodules.  His blood pressure was 122/76, pulse  70, respirations 18, and sats were 96%.  His eyesight has improved in  the last 6 months.  We will see him again in 6 months with a CT scan.   [name], M.D.  Electronically Signed   DPB/MEDQ  D:  02/25/2009  T:  02/25/2009  Job:  [id number]

## 2010-11-16 NOTE — Assessment & Plan Note (Signed)
OFFICE VISIT   SIGMOND, PATALANO  DOB:  10/01/1929                                        February 20, 2008  CHART #:  [id number]   The patient came for followup today.  His incision is well healed.  His  chest x-ray is stable.  His blood pressure is 138/84, pulse 83,  respirations 18, and sats were 93%.  Lungs were clear to auscultation  and percussion.  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  He also had some nose surgery  recently for epistaxis.  I am planning to see him back again in 6 months  to repeat his CT scan since this was a carcinoid and that will be in  approximately 1 year.   [name], M.D.  Electronically Signed   DPB/MEDQ  D:  02/20/2008  T:  02/21/2008  Job:  [id number]
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
## 2010-11-16 NOTE — Assessment & Plan Note (Signed)
OFFICE VISIT   Jonathan Medina, Jonathan Medina  DOB:  1930-03-26                                        September 06, 2007  CHART #:  [id number]   [name] came in today.  His incisions are well healed.  Blood  pressure is 160/86, pulse 74, respirations 18, SATs were 97%.  Lungs  were clear to auscultation and percussion.  I plan to see him back again  in four weeks for his followup appointment.   [name], M.D.  Electronically Signed   DPB/MEDQ  D:  09/06/2007  T:  09/06/2007  Job:  [id number]

## 2010-11-16 NOTE — Assessment & Plan Note (Signed)
[facility]                             PULMONARY OFFICE NOTE   ETHIN, DRUMMOND                      MRN:          [id number]  DATE:05/10/2007                            DOB:          12/26/1929    HISTORY OF PRESENT ILLNESS:  Delightful 77 year old black male who quit  smoking in 2000 with an incidentally noted right middle lobe nodule in  June 2008 who returns for follow up  PFTs to see whether he might be a  candidate for excision if this proves to be malignant.  He denied any  significant dyspnea, fever, chills, sweats, orthopnea, PND, leg  swelling, cough, unintended weight loss.   PHYSICAL EXAMINATION:  GENERAL:  Robust, pleasant, ambulatory black male  in no acute distress.  VITAL SIGNS:  Stable.  HEENT:  Unremarkable.  Pharynx is clear.  LUNGS:  Lung fields completely clear bilaterally to auscultation and  percussion.  HEART:  Regular rhythm without murmurs, gallops, rubs.  ABDOMEN:  Soft, benign.  EXTREMITIES:  Warm without calf tenderness, cyanosis, clubbing or edema.   LABORATORY DATA:  Hemoglobin saturation 98% on room air.  PFTs revealed  FEV1 of 1.76 which is 68% predicted with a ratio of 65% and a diffusing  capacity that corrects to 106% predicted.   IMPRESSION:  1. This patient does have very mild COPD status post smoking cessation      in 2000.  However, presently he is asymptomatic and would be an      excellent candidate for right middle lobectomy if needed.  2. The main finding on his PFTs is one of decreased expiratory reserve      typical of effects of obesity which I will let Dr. Artist Pais know about,      but not address here specifically in the pulmonary clinic.  3. He has a tiny nodule in the right middle lobe that is only 8 mm      which is at the cut off of sensitivity for PET scan.  Therefore, I      have recommended a follow up CT scan limited to the right middle      lobe at a 6 month interval (which comes due the  middle of      December).  If this is showing any growth at all, it needs to be      removed.  If not, we will probably follow it up in a year.     Charlaine Dalton. Sherene Sires, MD, [facility]  Electronically Signed    MBW/MedQ  DD: 05/10/2007  DT: 05/10/2007  Job #: [id number]

## 2010-11-16 NOTE — Letter (Signed)
August 23, 2007   Casimiro Needle B. Sherene Sires, MD, FCCP  [address]  [place], [place] [address]   Re:  NOEL, RODIER               DOB:  Nov 04, 1929   Dear Kathlene November:   I saw the patient back today after we did a resection of his right upper  lobe lesion which turned out to be a large low-grade neuroendocrine  neoplasm which was thus carcinoid.  There was some neuroendocrine  proliferation of the margins.  He probably had an adequate resection  particularly given the low-grade characteristic of this  tumor.  His  blood pressure was 126/79.  Pulse 70.  Respirations 18.  Sats were 92%.  I removed his chest tube sutures and told him to gradually increase his  activity and we will see him back again in 2 weeks with a chest x-ray.   [name], M.D.  Electronically Signed   DPB/MEDQ  D:  08/23/2007  T:  08/24/2007  Job:  [id number]

## 2010-11-16 NOTE — Assessment & Plan Note (Signed)
OFFICE VISIT   NEILSON, OEHLERT  DOB:  08-07-1929                                        August 05, 2009  CHART #:  [id number]   The patient came for followup today.  His CT scan showed no evidence of  recurrence of his low-grade neuroendocrine cancer now 2 years since his  surgery.  He is doing well overall.  We will see him back in 6 months  with a chest x-ray.  His blood pressure is 120/78, pulse 79,  respirations 18, sats were 97%.   [name], M.D.  Electronically Signed   DPB/MEDQ  D:  08/05/2009  T:  08/06/2009  Job:  [id number]

## 2010-11-16 NOTE — Op Note (Signed)
Jonathan Medina, [name]               ACCOUNT NO.:  [id number]   MEDICAL RECORD NO.:  [id number]          PATIENT TYPE:  INP   LOCATION:  3313                         FACILITY:  [facility]   PHYSICIAN:  [name], M.D. DATE OF BIRTH:  1929/10/12   DATE OF PROCEDURE:  08/09/2007  DATE OF DISCHARGE:                               OPERATIVE REPORT   PREOPERATIVE DIAGNOSIS:  Right upper lobe enlarging nodule.   POSTOPERATIVE DIAGNOSIS:  Right upper lobe enlarging nodule.   OPERATION:  Right video assisted thoracoscopic surgery, mini-  thoracotomy, wedge right upper lobe anterior segmental nodule with node  sampling.   SURGEON:  [name], M.D.   FIRST ASSISTANT:  Raynald Blend, RNFA.   ANESTHESIA:  General anesthesia.   After percutaneous insertion of all monitoring lines, the patient  underwent general anesthesia, was turned to the right lateral  thoracotomy position, was prepped and draped in the usual sterile  manner.  Two trocar sites were made in the anterior and posterior  axillary line at the seventh intercostal space. Two trocars were  inserted.  The diaphragm was very, very high because of the patient's  habitus.  We were able to get the scope in finally and see the lesion  along the fissure of the anterior segment of the right upper lobe.  Pictures were taken of this lesion.  It was approximately 1 cm in size.  We then did a 7 cm access incision over the fifth intercostal space to  try and do a wedge of this, but because of the patient's habitus, we had  to insert a small Churchill retractor and I used a lung clamp to pull  the lesion up, partially divided the minor fissure with one application  of the Autosuture 30 mm blue stapler.  We dissected the lesion with  several applications of the Autosuture green stapler.  All bleeding was  electrocoagulated.  FloSeal was applied to the staple line.  An 11R node  was sampled.  No other nodes were seen.  Two chest tubes  were inserted  through the trocar site and tied in place with 0 silk.  A Marcaine block  was done in the usual fashion.  A single On-Q was inserted in the usual  fashion.  The chest was closed with two pericostal drilling through the  sixth rib and passed around the fifth rib, #1 Vicryl in the muscle  layer, 2-0 Vicryl in the subcutaneous tissue, and Dermabond for the  skin.  The patient was returned to the recovery room in stable  condition.      [name], M.D.  Electronically Signed     DPB/MEDQ  D:  08/09/2007  T:  08/09/2007  Job:  [id number]

## 2010-11-16 NOTE — Assessment & Plan Note (Signed)
[facility]                             PULMONARY OFFICE NOTE   Jonathan Medina, Jonathan Medina                      MRN:          [id number]  DATE:03/29/2007                            DOB:          Feb 16, 1930    HISTORY:  This is a delightful, 77 year old, black male who quit smoking  in 2000 with incidental right lung nodule noted on chest x-ray in June  2008. He did not have any previous x-rays other than one in 1998 to  compare with and he comes back today for apples to applies comparison  studies. Unfortunately the most recent x-ray is from 2001 and is not a  good film especially on the lateral view of the lung markings, the  inspiration is very suboptimal.   However, the patient reports he is doing well with no significant  dyspnea, chest pain, fever, chills, sweats, orthopnea, PND, leg  swelling, unexplained cough, unintended weight loss.   PHYSICAL EXAMINATION:  GENERAL:  He is a pleasant, ambulatory, black  male in no acute distress.  VITAL SIGNS:  He had stable vital signs.  HEENT:  Unremarkable. Pharynx clear.  LUNGS:  Lung fields perfectly clear bilaterally to auscultation and  percussion.  HEART:  Regular rate and rhythm without murmur, gallop or rub.  ABDOMEN:  Soft and benign.  EXTREMITIES:  Warm without calf tenderness, cyanosis, clubbing or edema.   Chest x-ray was repeated today and to my surprise I could not see on the  PA view any evidence of a nodule. On the lateral view, I think I see a  density projecting over the horizontal fissure anteriorly.   IMPRESSION:  If this is a malignancy, it will probably require a right  upper and right middle lobectomy. However, the fact that it cannot even  be seen now 3 months after it was originally detected is strong evidence  to me that this is probably not a malignancy   To sort through the differential though I did discuss openly with the  patient that chest x-rays are subject to variability  (noting that the  one that he had in 2001 looks identical to today's films but the lateral  view is not usable for comparison and is the only view where we can  actually see a possibility of a nodule today).   I did recommend followup in 6 weeks with PFTs and another chest x-ray.  If he is developing macroscopic changes and he is an operative  candidate, I would proceed directly to an  excisional biopsy rather than resort to nonspecific studies (at 8 mm  this is the cut off for sensitivity for a PET scan which would probably  further confuse the issue here therefore).     Charlaine Dalton. Sherene Sires, MD, [facility]  Electronically Signed    MBW/MedQ  DD: 03/29/2007  DT: 03/30/2007  Job #: [id number]   cc:   Barbette Hair. Artist Pais, DO

## 2010-11-16 NOTE — Assessment & Plan Note (Signed)
OFFICE VISIT   Jonathan Medina, Jonathan Medina  DOB:  1930/01/30                                        February 03, 2010  CHART #:  [id number]   The patient came today.  His chest x-ray is stable.  He is now overall 2-  2/1 since his surgery.  His blood pressure was 135/85, pulse 76,  respirations 18, sats were 96%.  He has had some prostate doctor, Dr.  Vonita Moss, and had colonoscopy by Dr. Kinnie Scales.  I plan to see him back  again in 6 months with a CT scan.   [name], M.D.  Electronically Signed   DPB/MEDQ  D:  02/03/2010  T:  02/04/2010  Job:  [id number]

## 2010-11-16 NOTE — Assessment & Plan Note (Signed)
[facility]                             PULMONARY OFFICE NOTE   IMMANUEL, FEDAK                      MRN:          [id number]  DATE:02/12/2007                            DOB:          01-19-1930    REFERRING PHYSICIAN:  Barbette Hair. Artist Pais, DO   REASON FOR CONSULTATION:  Abnormal CT scan.   HISTORY OF PRESENT ILLNESS:  A 77 year old black male who, as I  understand it, was undergoing preoperative evaluation for testicular  surgery and was found to have a right upper lobe nodule.  Although he  says he has had an abnormal x-ray for years, he did not have any  available comparison x-rays in the record and so underwent a CT scan of  the chest which not only showed a right upper lobe nodule but also  multiple thyroid nodules which Dr. Artist Pais is evaluating.   The patient denies any pleuritic or exertional chest pain, orthopnea,  PND, or leg swelling.  He quit smoking June 2000 and is retired with no  unusual travel, pet or hobby exposure.   PAST MEDICAL HISTORY:  Significant for:  1. Hypertension.  2. Hyperlipidemia.   ALLERGIES:  IODINE.   MEDICATIONS:  Taken in detail on the worksheet, correct in the column  dated February 12, 2007.   SOCIAL HISTORY:  He quit smoking in June 2000 and is retired.   FAMILY HISTORY:  Taken in detail and positive for prostate cancer in his  father, negative for lung cancer, rheumatologic disease.   REVIEW OF SYSTEMS:  Negative for rheumatologic complaints or previous  history of malignancy.   PHYSICAL EXAMINATION:  GENERAL:  This is a pleasant, moderately obese  black man in no acute distress.  VITAL SIGNS:  Stable.  HEENT:  Unremarkable.  Oropharynx clear.  Ear canals clear bilaterally.  Nasal turbinates normal with no crusting or ulceration.  Oropharynx  reveals no excessive postnasal drainage.  NECK:  Supple without cervical adenopathy or tenderness. Trachea is  midline.  LUNGS:  Lung fields perfectly clear  bilaterally to auscultation and  percussion with excellent air movement.  There was no cough elicited on  inspiratory or expiratory maneuvers.  HEART:  Regular rhythm without murmur, gallop, or rub.  ABDOMEN:  Soft, benign.  EXTREMITIES:  Warm without calf tenderness, cyanosis, clubbing, or  edema.   Chest x-ray reviewed from our office from 1998 show vague abnormalities  at the right apex.  The lesion seen on the chest x-ray dated June 5 was  felt to be new, near the middle aspect of the minor fissure which is not  located near the other lesions.  The patient, however, tells me that he  has had multiple x-rays done at Dr Blossom Hoops (I believe he means by Dr.  Stevphen Rochester) since 1998 which were not available for comparison.   I, therefore, recommended followup here in 6 weeks with all of the old x-  rays from Dr. Illene Bolus office and a new chest x-ray done for  apples-to-apples comparison of previous studies.  If this is, indeed, a  new lesion  in a former smoker, it can be seen obviously on plain film  and is not present on previous x-rays, I would consider proceeding with  an excisional biopsy rather than PET scan because the lesion is so small  it is likely a negative lesion will raise the possibility of false  negative.     Charlaine Dalton. Sherene Sires, MD, [facility]  Electronically Signed    MBW/MedQ  DD: 02/12/2007  DT: 02/13/2007  Job #: [id number]   cc:   Barbette Hair. Artist Pais, DO

## 2010-11-16 NOTE — Discharge Summary (Signed)
Jonathan Medina, Jonathan Medina               ACCOUNT NO.:  [id number]   MEDICAL RECORD NO.:  [id number]          PATIENT TYPE:  INP   LOCATION:  2041                         FACILITY:  [facility]   PHYSICIAN:  [name], M.D. DATE OF BIRTH:  [birth year]   DATE OF ADMISSION:  08/09/2007  DATE OF DISCHARGE:  08/14/2007                               DISCHARGE SUMMARY   FINAL DIAGNOSIS:  Right upper lobe enlarging nodule, low-grade  neuroendocrine neoplasm T1N0MX.   SECONDARY DIAGNOSES:  1. History of tobacco abuse.  2. Hypertension.  3. Benign prostatic hypertrophy.  4. Gout.  5. Hypertension.  6. Gynecomastia.   OPERATIONS/PROCEDURES:  Right video-assisted thoracoscopic surgery with  minithoracotomy, wedge of right upper lobe anterior segmental nodule  with no sampling.   HISTORY/PHYSICAL/HOSPITAL COURSE:  The patient is a 77 year old African-  American male who quit smoking June 2000.  He was found to have a 7.8  nodule in the right middle lobe.  PET scan done recently showed that  this has increased from 7.89 mm to 10 mm.  His pulmonary function tests  showed an FVC of 2.77, FEV1 of 1.87 and a DLCO of 69%.  He has had no  fever, chills, excessive sputum, weight loss or hemoptysis.  PET scan  done showed no uptake of the right upper lobe lesion, but had showed low  metabolic activity in his right middle lobe lesion such could not  exclude it was a metabolic active neoplasm.  The patient was seen and  evaluated by Dr. Edwyna Shell.  Dr. Edwyna Shell discussed with patient undergoing  wedge resection of the nodule.  He discussed risks and benefits with the  patient.  The patient acknowledged understanding and agreed to proceed.  Surgery was scheduled for August 09, 2007.  For details of the  patient's past medical history and physical exam, please see the  dictated H&P.   The patient was taken to the operating room August 09, 2007 where he  underwent a right video-assisted thoracoscopic surgery  with  minithoracotomy, wedge of right upper lobe anterior segmental nodule  with no sampling.  The patient tolerated this procedure well and was  transferred to the intensive care unit in stable condition.  Pathology  report came back showing a low-grade neuroendocrine neoplasm, T1N0MX.  The patient tolerated this procedure well and was transferred to the  intensive care unit in stable condition.  The patient was able to be  extubated following surgery.  Post-extubation, he was noted to be alert  and oriented x4.  Neuro intact.  The patient's postoperative course,  daily chest x-rays were obtained.  Chest x-rays are stable.  The patient  had no air leak postoperatively.  Posterior chest tube was discontinued  postop day 1 and suction was decreased.  Follow up chest x-ray done postop day 2 was stable.  No air leak was  noted.  Remaining chest tube was discontinued.  Repeat chest x-rays  remained stable with no pneumothorax.  The patient's vital signs were  monitored during this time.  He remained afebrile.  Heart rate and blood  pressure were stable.  He was able to be weaned off oxygen satting  greater than 90% on room air.  Postoperatively, the patient was slow to  progress.  He was ambulating with assistance.  Prior to discharge home,  this had improved.  He was tolerating diet well.  No nausea or vomiting  noted.   LABORATORY DATA:  August 13, 2007:  White count 6.6, hemoglobin 12.1,  hematocrit 35.4, platelet count 325, sodium 139, potassium 3.5, chloride  101, bicarb 30, BUN 19, creatinine 0.95, glucose 106.   The patient is afebrile, August 13, 2007 with O2 sats greater than 90%.  He is in normal sinus rhythm.  Pulmonary status is stable.  All  incisions are clean, dry, intact and healing well.  The patient is  tentatively ready for discharge home in the a.m. August 14, 2007.   FOLLOW UP APPOINTMENTS:  Follow up appointment will be arranged with Dr.  Edwyna Shell in 3 weeks.  Our  office will contact the patient with this  information.  The patient will need to obtain PA and lateral chest x-ray  30 minutes prior to this appointment.   ACTIVITY:  Patient instructed no driving till released to do so, no  heavy lifting over 10 pounds.  He is told to ambulate 3-4 times per day  and progress as tolerated.  Continue his breathing exercises.   INCISIONAL CARE:  The patient is told to shower washing his incisions  using soap and water.  He is to contact the office if he develops any  drainage or opening from any of his incision sites.   DIET:  The patient educated on diet to be low-fat, low-salt.   DISCHARGE MEDICATIONS:  1. Tylox 1-2 tablets q.4-6 h. p.r.n.  2. Coreg 20 mg daily.  3. Cozaar 50 mg b.i.d..  4. Flomax 0.4 mg daily.  5. Norvasc 10 mg daily.  6. Testosterone daily.  7. Potassium chloride daily.  8. Calcium with D daily.  9. Megace 3 daily.  10.Allopurinol 100 mg 2 tablets daily.  11.Colchicine 0.6 mg p.r.n.      Theda Belfast, [place]      [name], M.D.  Electronically Signed    KMD/MEDQ  D:  08/13/2007  T:  08/14/2007  Job:  [id number]   cc:   [name], M.D.

## 2010-11-19 NOTE — Assessment & Plan Note (Signed)
[facility]                             PRIMARY CARE OFFICE NOTE   Jonathan Medina, Jonathan Medina                      MRN:          [id number]  DATE:04/28/2006                            DOB:          07-06-1929    CHIEF COMPLAINT:  New patient to practice.   HISTORY OF PRESENT ILLNESS:  The patient is a 77 year old African-American  male here to establish primary care. The patient has had several primary  care physicians in the past, most recent being Dr. Barbee Shropshire. His past  medical history is significant for high blood pressure, high cholesterol,  hypogonadism. He is followed by Dr. Vonita Moss and has been treated with  AndroGel for the last 4 years according to the patient.   He has also seen Dr. Juleen China, endocrinologist, for hyperlipidemia. The patient  was tried on 2 statins, Zocor and Lipitor, but had severe allergies; one of  the statins caused tongue swelling and heart palpitations. He is very  reluctant to consider taking another statin.   He denies any history of heart disease although he was a long-time smoker.  He denies any history of COPD. No history of stroke.   PAST MEDICAL HISTORY SUMMARY:  1. Hypertension.  2. Hyperlipidemia.  3. History of colonic polyps.  4. History of hematuria, negative workup.  5. History of hypogonadism.  6. BPH.   CURRENT MEDICATIONS:  1. Hydrochlorothiazide 25 mg once a day.  2. Calcium with vitamin D 1500 mg once a day.  3. Altace 10 mg once a day.  4. Flomax 0.4 mg once a day.  5. K-Dur 20 mEq once a day.  6. AndroGel 10 g a day.   ALLERGIES TO MEDICATIONS:  None known __________.   SOCIAL HISTORY:  The patient is retired from working at [employer]	[place].  He has 4 grown children.   FAMILY HISTORY:  Mother deceased at age 38, had hypertension. Father  deceased at age 50 secondary to prostate cancer. Denies any other history of  cancers in the family.   HABITS:  He does not drink. He quit tobacco in  the year 2000, greater than  50-pack-year history.   PREVENTATIVE CARE HISTORY:  Last colonoscopy was in 2005. Last tetanus was  in 2005.   REVIEW OF SYSTEMS:  Please note that he has seen Dr Jenne Campus and has had  negative stress test and cardiac workup. He denies any chest pain or  shortness of breath. Denies heartburn, nausea, vomiting, constipation,  diarrhea. No dark stools or blood in his stools. All other systems negative.  He denies any history of erectile dysfunction.   PHYSICAL EXAMINATION:  VITAL SIGNS:  Height is 5 foot 8, weight 224 pounds,  temperature is 97.4, pulse 73, BP is 153/77.  GENERAL:  The patient is a very pleasant, well-developed, well-nourished, 80-  year-old African-American male who appears slightly younger than stated age.  HEENT:  Normocephalic, atraumatic. Pupils are equal, round, and reactive to  light bilaterally. Extraocular muscles were intact. The patient was  anicteric. Conjunctivae was within normal limits. Mild changes of arcus  seniles bilaterally. Oropharyngeal  exam was unremarkable.  NECK:  Supple. No adenopathy, carotid bruits or thyromegaly appreciated.  CHEST EXAM:  Normal inspiratory effort. Clear to auscultation bilaterally.  No rhonchi, rales, or wheezing.  CARDIOVASCULAR:  Regular rate and rhythm. No significant murmurs, rubs, or  gallops appreciated.  ABDOMEN:  Protuberant, nontender, positive bowel sounds. No organomegaly.  MUSCULOSKELETAL:  No clubbing, cyanosis, or edema. The patient had decreased  hair distribution from his ankles to his toes. However, intact pedis  dorsalis pulses.  NEUROLOGICAL:  Cranial nerves II-XII were grossly intact. He was nonfocal.   LABORATORY DATA:  CBC, other labs performed October 22:  H&H was 14.2 and  42.8. Total cholesterol of 255, triglycerides 219, HDL 35.3, and a direct  LDL of 160.6. Comprehensive metabolic profile notable for BUN 19, creatinine  1.2. LFTs were unremarkable. TSH 1.21. PSA of  2.95. UA did show the moderate  blood.   IMPRESSION:  1. Hyperlipidemia, uncontrolled.  2. Hypertension, stable.  3. History of hematuria with benign work, followed by Dr. Vonita Moss.  4. Intolerance to statins.  5. History of colonic polyps.  6. Health maintenance.   RECOMMENDATIONS:  Certainly, patient is very reluctant to consider trying  another statin. We talked about the factors that may be currently  exacerbating his lipid profile. We elected to discontinue  hydrochlorothiazide, and the patient is to discussed with Dr. Vonita Moss  minimizing or using the smallest dose possible of testosterone/AndroGel.   He was given samples of Sular today, 10 mg once a day, to be taken in place  of hydrochlorothiazide. He is monitor blood pressures at home. Also, he was  advised to take over-the-counter Omega-3 fatty acids/fish oil.   We will recheck his lipid profile in approximately four weeks. Return visit  in six weeks. If his lipids are still suboptimal, we will discussed the  possibility of trying Pravastatin.   Lastly, he was updated with influenza vaccine today.     Barbette Hair. Artist Pais, DO    RDY/MedQ  DD: 04/28/2006  DT: 04/29/2006  Job #: [id number]

## 2011-02-01 ENCOUNTER — Other Ambulatory Visit: Payer: Self-pay | Admitting: *Deleted

## 2011-02-01 MED ORDER — LOSARTAN POTASSIUM 100 MG PO TABS: 100.0000 mg | ORAL_TABLET | Freq: Every day | ORAL | Status: DC

## 2011-02-07 ENCOUNTER — Other Ambulatory Visit: Payer: Self-pay | Admitting: *Deleted

## 2011-02-07 MED ORDER — LOSARTAN POTASSIUM 100 MG PO TABS: 100.0000 mg | ORAL_TABLET | Freq: Every day | ORAL | Status: DC

## 2011-03-23 ENCOUNTER — Telehealth: Payer: Self-pay | Admitting: Internal Medicine

## 2011-03-23 NOTE — Telephone Encounter (Signed)
Received copies from [facility] @ [facility] 03/23/2011. Forwarded  2pages to Dr. Reva Bores review.

## 2011-03-25 LAB — URINALYSIS, ROUTINE W REFLEX MICROSCOPIC
Bilirubin Urine: NEGATIVE
Glucose, UA: NEGATIVE
Ketones, ur: NEGATIVE
Leukocytes, UA: NEGATIVE
Nitrite: NEGATIVE
Protein, ur: NEGATIVE
Specific Gravity, Urine: 1.011
Urobilinogen, UA: 0.2
pH: 6

## 2011-03-25 LAB — CBC
HCT: 39.8
Hemoglobin: 12.4 — ABNORMAL LOW
Hemoglobin: 12.5 — ABNORMAL LOW
Hemoglobin: 13.5
MCHC: 33.9
MCHC: 34.3
MCV: 89.1
Platelets: 280
Platelets: 325
RBC: 3.95 — ABNORMAL LOW
RBC: 4.04 — ABNORMAL LOW
RBC: 4.09 — ABNORMAL LOW
RBC: 4.46
RDW: 13.5
WBC: 5.9
WBC: 9.5

## 2011-03-25 LAB — BASIC METABOLIC PANEL WITH GFR
BUN: 12
BUN: 14
CO2: 26
CO2: 30
Calcium: 8.7
Calcium: 9.1
Chloride: 101
Chloride: 105
Creatinine, Ser: 0.95
Creatinine, Ser: 1.02
GFR calc non Af Amer: >60
GFR calc non Af Amer: >60
Glucose, Bld: 106 — ABNORMAL HIGH
Glucose, Bld: 147 — ABNORMAL HIGH
Potassium: 3.5
Potassium: 4
Sodium: 136
Sodium: 139

## 2011-03-25 LAB — TYPE AND SCREEN: Antibody Screen: NEGATIVE

## 2011-03-25 LAB — BLOOD GAS, ARTERIAL
Acid-base deficit: 0.5
Bicarbonate: 24.6 — ABNORMAL HIGH
Drawn by: 274481
O2 Saturation: 96.7
Patient temperature: 98.6
TCO2: 24.9
pCO2 arterial: 50.6 — ABNORMAL HIGH
pH, Arterial: 7.309 — ABNORMAL LOW
pH, Arterial: 7.401
pO2, Arterial: 80.3

## 2011-03-25 LAB — COMPREHENSIVE METABOLIC PANEL WITH GFR
ALT: 17
ALT: 19
AST: 21
AST: 47 — ABNORMAL HIGH
Albumin: 3.6
Albumin: 3.8
Alkaline Phosphatase: 62
Alkaline Phosphatase: 75
BUN: 12
BUN: 16
CO2: 23
CO2: 27
Calcium: 8.7
Calcium: 9.2
Chloride: 101
Chloride: 105
Creatinine, Ser: 1.01
Creatinine, Ser: 1.02
GFR calc non Af Amer: >60
GFR calc non Af Amer: >60
Glucose, Bld: 100 — ABNORMAL HIGH
Glucose, Bld: 106 — ABNORMAL HIGH
Potassium: 4.6
Potassium: 4.8
Sodium: 134 — ABNORMAL LOW
Sodium: 136
Total Bilirubin: 0.6
Total Bilirubin: 2.3 — ABNORMAL HIGH
Total Protein: 6.2
Total Protein: 6.8

## 2011-03-25 LAB — PROTIME-INR: INR: 0.9

## 2011-03-25 LAB — APTT: aPTT: 32

## 2011-03-25 LAB — ABO/RH: ABO/RH(D): O POS

## 2011-03-25 LAB — URINE MICROSCOPIC-ADD ON

## 2011-03-31 ENCOUNTER — Ambulatory Visit (INDEPENDENT_AMBULATORY_CARE_PROVIDER_SITE_OTHER): Payer: Medicare Other | Admitting: Pulmonary Disease

## 2011-03-31 ENCOUNTER — Encounter: Payer: Self-pay | Admitting: Pulmonary Disease

## 2011-03-31 DIAGNOSIS — Z23 Encounter for immunization: Secondary | ICD-10-CM

## 2011-03-31 DIAGNOSIS — G4730 Sleep apnea, unspecified: Secondary | ICD-10-CM

## 2011-03-31 NOTE — Progress Notes (Signed)
  Subjective:    Patient ID: Jonathan Medina, male    DOB: May 02, 1930, 77 y.o.   MRN: [id number]  HPI 77/M, [name], ex smoker for FU of obstructive sleep apnea & chronic insomnia/ shift work disorder  He has PTSD & takes cymbalta daytime for many years. he also has long standing insomnia of sleep initiation & maintenance, since his youth. 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  Nocturnal PSg in dec'11 showed poor sleep efficiency with TST of 197 mins, AHI 12/h , desatn < 88% x 26 mins & lowest d esaturation to 76%. BMI was 35  Download 2/8-09/07/10 >> 5 h compliance, 10 cm OK  New face mask helped, raised humidifier setting for dry mouth  Download 3/1-3/30/12 >> 4.5 h, no events  03/31/2011  Reports good compliance > 6h. Wife is very pleased - nightmares have stopped, no snoring. Feels rested, has got good fitting mask, no leak, pressure ok.   Review of Systems Patient denies significant dyspnea,cough, hemoptysis,  chest pain, palpitations, pedal edema, orthopnea, paroxysmal nocturnal dyspnea, lightheadedness, nausea, vomiting, abdominal or  leg pains      Objective:   Physical Exam  Gen. Pleasant, obese, in no distress ENT - no lesions, no post nasal drip Neck: No JVD, no thyromegaly, no carotid bruits Lungs: no use of accessory muscles, no dullness to percussion, decreased without rales or rhonchi  Cardiovascular: Rhythm regular, heart sounds  normal, no murmurs or gallops, no peripheral edema Musculoskeletal: No deformities, no cyanosis or clubbing , no tremors       Assessment & Plan:

## 2011-03-31 NOTE — Patient Instructions (Addendum)
Flu shot Your CPAP is set at 10 cm

## 2011-03-31 NOTE — Assessment & Plan Note (Signed)
He seems to have adjusted well to CPAP Ct 10 cm, fullface mask Nightmares could be related to PTSD , worsened by OSA - doubt REM behaviour disorder since no clear h/o 'acting out ' dreams. Weight loss encouraged, compliance with goal of at least 4-6 hrs every night is the expectation. Advised against medications with sedative side effects Cautioned against driving when sleepy - understanding that sleepiness will vary on a day to day basis FLu shot

## 2011-03-31 NOTE — Progress Notes (Signed)
Addended by: Ozella Almond R on: 03/31/2011 12:10 PM   Modules accepted: Orders

## 2011-04-04 ENCOUNTER — Telehealth: Payer: Self-pay | Admitting: *Deleted

## 2011-04-04 ENCOUNTER — Other Ambulatory Visit: Payer: Self-pay | Admitting: *Deleted

## 2011-04-04 MED ORDER — CARVEDILOL PHOSPHATE ER 20 MG PO CP24: 20.0000 mg | ORAL_CAPSULE | Freq: Every day | ORAL | Status: DC

## 2011-04-04 NOTE — Telephone Encounter (Signed)
done

## 2011-04-04 NOTE — Telephone Encounter (Signed)
Patient requesting RF of coreg to mail order pharm and also wants to know when he is due for f/u?

## 2011-04-05 ENCOUNTER — Other Ambulatory Visit: Payer: Self-pay | Admitting: *Deleted

## 2011-04-05 MED ORDER — AMLODIPINE BESYLATE 10 MG PO TABS: 10.0000 mg | ORAL_TABLET | Freq: Every day | ORAL | Status: DC

## 2011-04-18 LAB — BASIC METABOLIC PANEL
CO2: 29
Calcium: 9.7
Creatinine, Ser: 0.97
GFR calc Af Amer: >60
GFR calc non Af Amer: >60
Sodium: 143

## 2011-04-18 LAB — HEMOGLOBIN AND HEMATOCRIT, BLOOD
HCT: 36.7 — ABNORMAL LOW
Hemoglobin: 12.7 — ABNORMAL LOW

## 2011-06-09 ENCOUNTER — Telehealth: Payer: Self-pay | Admitting: *Deleted

## 2011-06-09 NOTE — Telephone Encounter (Signed)
sure

## 2011-06-09 NOTE — Telephone Encounter (Signed)
Pt states that his Handicap placard is expiring and would like to know if you would renew w/o OV.?

## 2011-06-10 NOTE — Telephone Encounter (Signed)
Informed pt to drop off form to be filled out by MD

## 2011-09-21 HISTORY — PX: TRANSTHORACIC ECHOCARDIOGRAM: SHX275

## 2011-10-21 DIAGNOSIS — M109 Gout, unspecified: Secondary | ICD-10-CM | POA: Diagnosis not present

## 2011-10-21 DIAGNOSIS — M6530 Trigger finger, unspecified finger: Secondary | ICD-10-CM | POA: Diagnosis not present

## 2011-11-15 DIAGNOSIS — S62329A Displaced fracture of shaft of unspecified metacarpal bone, initial encounter for closed fracture: Secondary | ICD-10-CM | POA: Diagnosis not present

## 2011-11-16 DIAGNOSIS — S62329A Displaced fracture of shaft of unspecified metacarpal bone, initial encounter for closed fracture: Secondary | ICD-10-CM | POA: Diagnosis not present

## 2011-11-17 DIAGNOSIS — H0230 Blepharochalasis unspecified eye, unspecified eyelid: Secondary | ICD-10-CM | POA: Diagnosis not present

## 2011-11-17 DIAGNOSIS — H2510 Age-related nuclear cataract, unspecified eye: Secondary | ICD-10-CM | POA: Diagnosis not present

## 2011-11-17 DIAGNOSIS — H4320 Crystalline deposits in vitreous body, unspecified eye: Secondary | ICD-10-CM | POA: Diagnosis not present

## 2011-11-25 ENCOUNTER — Telehealth: Payer: Self-pay | Admitting: Internal Medicine

## 2011-11-25 MED ORDER — COLCHICINE 0.6 MG PO TABS: 0.6000 mg | ORAL_TABLET | Freq: Three times a day (TID) | ORAL | Status: DC | PRN

## 2011-11-25 NOTE — Telephone Encounter (Signed)
Pt advised of Rx/pharmacy and will call back to schedule appt.

## 2011-11-25 NOTE — Telephone Encounter (Signed)
Okay to Rx colchicine for this MEN pt in his absence. Pt states that he is experiencing a gout flare. Please advise.

## 2011-11-25 NOTE — Telephone Encounter (Signed)
Patient requesting Rx for colchicine be called in for him.

## 2011-11-25 NOTE — Telephone Encounter (Signed)
Ok #12 Needs OV w/Dr Norins next month Thx

## 2011-11-29 DIAGNOSIS — S62329A Displaced fracture of shaft of unspecified metacarpal bone, initial encounter for closed fracture: Secondary | ICD-10-CM | POA: Diagnosis not present

## 2011-12-07 ENCOUNTER — Ambulatory Visit: Payer: Medicare Other | Admitting: Internal Medicine

## 2011-12-08 ENCOUNTER — Encounter: Payer: Self-pay | Admitting: Internal Medicine

## 2011-12-08 ENCOUNTER — Ambulatory Visit (INDEPENDENT_AMBULATORY_CARE_PROVIDER_SITE_OTHER): Payer: Medicare Other | Admitting: Internal Medicine

## 2011-12-08 VITALS — BP 134/72 | HR 75 | Temp 97.5°F | Resp 16 | Ht 68.0 in | Wt 233.0 lb

## 2011-12-08 DIAGNOSIS — M722 Plantar fascial fibromatosis: Secondary | ICD-10-CM

## 2011-12-08 NOTE — Patient Instructions (Signed)
This last bout of pain, that was the heel not a joint, was not gout but more likely to be Plantar Fasciitis - a tendonitis.  Plan - for a flare of pain you should do stretches and use an anti-inflammatory drug like aleve. You can go to YouTube.com and search on plantar fasciitis and stretch for many videos on treatment you can do at home.    Plantar Fasciitis Plantar fasciitis is a common condition that causes foot pain. It is soreness (inflammation) of the band of tough fibrous tissue on the bottom of the foot that runs from the heel bone (calcaneus) to the ball of the foot. The cause of this soreness may be from excessive standing, poor fitting shoes, running on hard surfaces, being overweight, having an abnormal walk, or overuse (this is common in runners) of the painful foot or feet. It is also common in aerobic exercise dancers and ballet dancers. SYMPTOMS   Most people with plantar fasciitis complain of:  Severe pain in the morning on the bottom of their foot especially when taking the first steps out of bed. This pain recedes after a few minutes of walking.   Severe pain is experienced also during walking following a long period of inactivity.   Pain is worse when walking barefoot or up stairs  DIAGNOSIS    Your caregiver will diagnose this condition by examining and feeling your foot.   Special tests such as X-rays of your foot, are usually not needed.  PREVENTION    Consult a sports medicine professional before beginning a new exercise program.   Walking programs offer a good workout. With walking there is a lower chance of overuse injuries common to runners. There is less impact and less jarring of the joints.   Begin all new exercise programs slowly. If problems or pain develop, decrease the amount of time or distance until you are at a comfortable level.   Wear good shoes and replace them regularly.   Stretch your foot and the heel cords at the back of the ankle (Achilles  tendon) both before and after exercise.   Run or exercise on even surfaces that are not hard. For example, asphalt is better than pavement.   Do not run barefoot on hard surfaces.   If using a treadmill, vary the incline.   Do not continue to workout if you have foot or joint problems. Seek professional help if they do not improve.  HOME CARE INSTRUCTIONS    Avoid activities that cause you pain until you recover.   Use ice or cold packs on the problem or painful areas after working out.   Only take over-the-counter or prescription medicines for pain, discomfort, or fever as directed by your caregiver.   Soft shoe inserts or athletic shoes with air or gel sole cushions may be helpful.   If problems continue or become more severe, consult a sports medicine caregiver or your own health care provider. Cortisone is a potent anti-inflammatory medication that may be injected into the painful area. You can discuss this treatment with your caregiver.  MAKE SURE YOU:    Understand these instructions.   Will watch your condition.   Will get help right away if you are not doing well or get worse.  Document Released: 03/15/2001 Document Revised: 06/09/2011 Document Reviewed: 05/14/2008 [facility] Patient Information 2012 [place], [place].

## 2011-12-08 NOTE — Progress Notes (Signed)
  Subjective:    Patient ID: Jonathan Medina, male    DOB: February 07, 1930, 78 y.o.   MRN: [id number]  HPI Mr. Pryor is concerned about flares of gout. He gets flares after surgery or injury. He has always got a response to colchine. This last flare of pain  was his heel and it did not respond to Colcrys (colchicine)  PMH, FamHx and SocHx reviewed for any changes and relevance.    Review of Systems System review is negative for any constitutional, cardiac, pulmonary, GI or neuro symptoms or complaints other than as described in the HPI.     Objective:   Physical Exam Filed Vitals:   12/08/11 1618  BP: 134/72  Pulse: 75  Temp: 97.5 F (36.4 C)  Resp: 16   Cor- RRR PUlm - normal respirations Neuro - A&O x 3, normal gait.       Assessment & Plan:  Heel pain - patient's episode of pain was not located at a joint, thus almost certainly not gout. This is why colchicine did not work. By description he had an episode of plantar fasciitis which has resolved.

## 2011-12-15 DIAGNOSIS — S62329A Displaced fracture of shaft of unspecified metacarpal bone, initial encounter for closed fracture: Secondary | ICD-10-CM | POA: Diagnosis not present

## 2011-12-30 DIAGNOSIS — R972 Elevated prostate specific antigen [PSA]: Secondary | ICD-10-CM | POA: Diagnosis not present

## 2011-12-30 DIAGNOSIS — E291 Testicular hypofunction: Secondary | ICD-10-CM | POA: Diagnosis not present

## 2012-01-02 DIAGNOSIS — G609 Hereditary and idiopathic neuropathy, unspecified: Secondary | ICD-10-CM | POA: Diagnosis not present

## 2012-01-03 ENCOUNTER — Telehealth: Payer: Self-pay | Admitting: Internal Medicine

## 2012-01-03 NOTE — Telephone Encounter (Signed)
Forward 3 pages to Dr. Illene Regulus for review on 01-03-12 ym

## 2012-01-06 DIAGNOSIS — N529 Male erectile dysfunction, unspecified: Secondary | ICD-10-CM | POA: Diagnosis not present

## 2012-01-06 DIAGNOSIS — R3129 Other microscopic hematuria: Secondary | ICD-10-CM | POA: Diagnosis not present

## 2012-01-06 DIAGNOSIS — N401 Enlarged prostate with lower urinary tract symptoms: Secondary | ICD-10-CM | POA: Diagnosis not present

## 2012-01-06 DIAGNOSIS — R972 Elevated prostate specific antigen [PSA]: Secondary | ICD-10-CM | POA: Diagnosis not present

## 2012-01-11 ENCOUNTER — Encounter: Payer: Self-pay | Admitting: Internal Medicine

## 2012-01-11 ENCOUNTER — Ambulatory Visit (INDEPENDENT_AMBULATORY_CARE_PROVIDER_SITE_OTHER): Payer: Medicare Other | Admitting: Internal Medicine

## 2012-01-11 ENCOUNTER — Ambulatory Visit (INDEPENDENT_AMBULATORY_CARE_PROVIDER_SITE_OTHER)
Admission: RE | Admit: 2012-01-11 | Discharge: 2012-01-11 | Disposition: A | Discharge status: Home / Self Care | Payer: Medicare Other | Source: Ambulatory Visit | Attending: Internal Medicine | Admitting: Internal Medicine

## 2012-01-11 VITALS — BP 132/80 | HR 72 | Temp 98.4°F | Resp 16 | Wt 232.0 lb

## 2012-01-11 DIAGNOSIS — M25559 Pain in unspecified hip: Secondary | ICD-10-CM | POA: Diagnosis not present

## 2012-01-11 DIAGNOSIS — M169 Osteoarthritis of hip, unspecified: Secondary | ICD-10-CM | POA: Insufficient documentation

## 2012-01-11 DIAGNOSIS — M25551 Pain in right hip: Secondary | ICD-10-CM

## 2012-01-11 DIAGNOSIS — M25552 Pain in left hip: Secondary | ICD-10-CM

## 2012-01-11 MED ORDER — NAPROXEN SODIUM 220 MG PO TABS: 220.0000 mg | ORAL_TABLET | Freq: Two times a day (BID) | ORAL | Status: DC

## 2012-01-11 NOTE — Patient Instructions (Addendum)
Hip pain - suspect osteoarthritis = wear and tear arthritis. Exam reveals that there is not a surgical problem.  Plan Bilateral hip x-rays to set a baseline  For pain try Aleve 1 tablet AM and PM - watch for any gastric irritation  Osteoarthritis Osteoarthritis is the most common form of arthritis. It is redness, soreness, and swelling (inflammation) affecting the cartilage. Cartilage acts as a cushion, covering the ends of bones where they meet to form a joint. CAUSES   Over time, the cartilage begins to wear away. This causes bone to rub on bone. This produces pain and stiffness in the affected joints. Factors that contribute to this problem are:  Excessive body weight.   Age.   Overuse of joints.  SYMPTOMS    People with osteoarthritis usually experience joint pain, swelling, or stiffness.   Over time, the joint may lose its normal shape.   Small deposits of bone (osteophytes) may grow on the edges of the joint.   Bits of bone or cartilage can break off and float inside the joint space. This may cause more pain and damage.   Osteoarthritis can lead to depression, anxiety, feelings of helplessness, and limitations on daily activities.  The most commonly affected joints are in the:  Ends of the fingers.   Thumbs.   Neck.   Lower back.   Knees.   Hips.  DIAGNOSIS   Diagnosis is mostly based on your symptoms and exam. Tests may be helpful, including:  X-rays of the affected joint.   A computerized magnetic scan (MRI).   Blood tests to rule out other types of arthritis.   Joint fluid tests. This involves using a needle to draw fluid from the joint and examining the fluid under a microscope.  TREATMENT   Goals of treatment are to control pain, improve joint function, maintain a normal body weight, and maintain a healthy lifestyle. Treatment approaches may include:  A prescribed exercise program with rest and joint relief.   Weight control with nutritional education.    Pain relief techniques such as:   Properly applied heat and cold.   Electric pulses delivered to nerve endings under the skin (transcutaneous electrical nerve stimulation, TENS).   Massage.   Certain supplements. Ask your caregiver before using any supplements, especially in combination with prescribed drugs.   Medicines to control pain, such as:   Acetaminophen.   Nonsteroidal anti-inflammatory drugs (NSAIDs), such as naproxen.   Narcotic or central-acting agents, such as tramadol. This drug carries a risk of addiction and is generally prescribed for short-term use.   Corticosteroids. These can be given orally or as injection. This is a short-term treatment, not recommended for routine use.   Surgery to reposition the bones and relieve pain (osteotomy) or to remove loose pieces of bone and cartilage. Joint replacement may be needed in advanced states of osteoarthritis.  HOME CARE INSTRUCTIONS   Your caregiver can recommend specific types of exercise. These may include:  Strengthening exercises. These are done to strengthen the muscles that support joints affected by arthritis. They can be performed with weights or with exercise bands to add resistance.   Aerobic activities. These are exercises, such as brisk walking or low-impact aerobics, that get your heart pumping. They can help keep your lungs and circulatory system in shape.   Range-of-motion activities. These keep your joints limber.   Balance and agility exercises. These help you maintain daily living skills.  Learning about your condition and being actively involved in  your care will help improve the course of your osteoarthritis. SEEK MEDICAL CARE IF:    You feel hot or your skin turns red.   You develop a rash in addition to your joint pain.   You have an oral temperature above 102 F (38.9 C).  FOR MORE INFORMATION   National Institute of Arthritis and Musculoskeletal and Skin Diseases:  www.niams.http://www.myers.net/ [employer] on Aging: https://walker.com/ American College of Rheumatology: www.rheumatology.org Document Released: 06/20/2005 Document Revised: 06/09/2011 Document Reviewed: 10/01/2009 [facility] Patient Information 2012 [place], [place].

## 2012-01-12 NOTE — Assessment & Plan Note (Signed)
Patient c/o hip pain. On exam no symptoms that will require surgical intervention at this time. X-ray confirms mild DJD bilaterally.  Plan- NSAIDs, Aleve BID, to use with caution re: GI irritation.

## 2012-01-12 NOTE — Progress Notes (Signed)
Subjective:    Patient ID: Jonathan Medina, male    DOB: 1930/05/07, 78 y.o.   MRN: [id number]  HPI Jonathan Medina presents for evaluation of hip pain R>L. He has been followed by [facility] and neurology for cold related peripheral neuropathy. Lately he has developed increased pain in the hip, much more so on the right. He has fallen. His ambulation is limited and he is using a cane. He has not been taking any anti-inflammatory medication for his pain but has continued his neuropathy related medications which have not helped.   Past Medical History  Diagnosis Date  . Hyperlipemia   . Hypertension   . Gout   . Pulmonary nodule   . Multiple thyroid nodules   . History of colonic polyps    Past Surgical History  Procedure Date  . Minithoracotomy    Family History  Problem Relation Age of Onset  . Prostate cancer Father   . Bone cancer Mother    History   Social History  . Marital Status: Married    Spouse Name: N/A    Number of Children: 4  . Years of Education: N/A   Occupational History  . Retired    Social History Main Topics  . Smoking status: Former Smoker -- 1.0 packs/day for 50 years    Quit date: 07/04/1998  . Smokeless tobacco: Never Used  . Alcohol Use: No  . Drug Use: Not on file  . Sexually Active: Not on file   Other Topics Concern  . Not on file   Social History Narrative  . No narrative on file    Current Outpatient Prescriptions on File Prior to Visit  Medication Sig Dispense Refill  . amLODipine (NORVASC) 10 MG tablet Take 1 tablet (10 mg total) by mouth daily.  90 tablet  3  . aspirin 81 MG tablet Take 81 mg by mouth daily.        . calcium citrate-vitamin D (CITRACAL+D) 315-200 MG-UNIT per tablet Take 1 tablet by mouth daily.        . carvedilol (COREG CR) 20 MG 24 hr capsule Take 1 capsule (20 mg total) by mouth daily.  90 capsule  3  . colchicine 0.6 MG tablet Take 1 tablet (0.6 mg total) by mouth 3 (three) times daily as needed (gout).  12 tablet  0  .  DULoxetine (CYMBALTA) 60 MG capsule Take 60 mg by mouth daily.        . fish oil-omega-3 fatty acids 1000 MG capsule Take 1 g by mouth daily.       Marland Kitchen gabapentin (NEURONTIN) 100 MG capsule Take 100 mg by mouth 3 (three) times daily.        Marland Kitchen losartan (COZAAR) 100 MG tablet Take 1 tablet (100 mg total) by mouth daily.  90 tablet  3  . Tamsulosin HCl (FLOMAX) 0.4 MG CAPS Take 0.4 mg by mouth daily.            Review of Systems System review is negative for any constitutional, cardiac, pulmonary, GI or neuro symptoms or complaints other than as described in the HPI.     Objective:   Physical Exam Filed Vitals:   01/11/12 1629  BP: 132/80  Pulse: 72  Temp: 98.4 F (36.9 C)  Resp: 16   Wt Readings from Last 3 Encounters:  01/11/12 232 lb (105.235 kg)  12/08/11 233 lb (105.688 kg)  03/31/11 232 lb (105.235 kg)   Gen'l- heavyset AA man in no  acute distress HEENT- C&S clear Cor- 2+ radial pulse, RRR Pulm - normal respirations MSK - Left hip - normal range of motion, no tenderness with internal or external rotation, no tenderness to AP pressure or pressure against the greater trochanter.            Right hip - normal range of motion, no tenderness with internal or external rotation, no tenderness to AP pressure or pressure against the greater trochanter.  Bilateral Hip films: BILATERAL HIP WITH PELVIS - 4+ VIEW  Comparison: 01/02/2008  Findings: Negative for fracture. No focal bony lesion is  identified. Mild joint space narrowing in the hips bilaterally  without significant spurring. Negative for AVN.  IMPRESSION:  Mild joint space narrowing of the hip bilaterally. No acute bony  change.       Assessment & Plan:

## 2012-01-15 ENCOUNTER — Encounter: Payer: Self-pay | Admitting: Internal Medicine

## 2012-01-26 ENCOUNTER — Encounter: Payer: Self-pay | Admitting: Pulmonary Disease

## 2012-01-26 ENCOUNTER — Ambulatory Visit (INDEPENDENT_AMBULATORY_CARE_PROVIDER_SITE_OTHER): Payer: Medicare Other | Admitting: Pulmonary Disease

## 2012-01-26 VITALS — BP 108/68 | HR 80 | Temp 97.3°F | Ht 68.0 in | Wt 235.6 lb

## 2012-01-26 DIAGNOSIS — J984 Other disorders of lung: Secondary | ICD-10-CM | POA: Diagnosis not present

## 2012-01-26 DIAGNOSIS — G4730 Sleep apnea, unspecified: Secondary | ICD-10-CM

## 2012-01-26 NOTE — Progress Notes (Signed)
  Subjective:    Patient ID: Jonathan Medina, male    DOB: [birth year], 78 y.o.   MRN: [id number]  HPI [name], [name], ex smoker for FU of obstructive sleep apnea & chronic insomnia/ shift work disorder  He has PTSD & takes cymbalta daytime for many years. he also has long standing insomnia of sleep initiation & maintenance, since his youth. 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  Nocturnal PSg in dec'11 showed poor sleep efficiency with TST of 197 mins, AHI 12/h , desatn < 88% x 26 mins & lowest d esaturation to 76%. BMI was 35  Download 2/8-09/07/10 >> 5 h compliance, 10 cm OK  New face mask helped, raised humidifier setting for dry mouth  Download 3/1-3/30/12 >> 4.5 h, no events    01/26/2012 9 m FU  Reports good compliance > 6h. Wife is very pleased - nightmares have stopped, no snoring.  Feels rested, no leak, pressure ok.  Changed to medium pillows Takes cymbalta & neurontin at night     Review of Systems Patient denies significant dyspnea,cough, hemoptysis,  chest pain, palpitations, pedal edema, orthopnea, paroxysmal nocturnal dyspnea, lightheadedness, nausea, vomiting, abdominal or  leg pains      Objective:   Physical Exam  Gen. Pleasant, well-nourished, in no distress ENT - no lesions, no post nasal drip Neck: No JVD, no thyromegaly, no carotid bruits Lungs: no use of accessory muscles, no dullness to percussion, clear without rales or rhonchi  Cardiovascular: Rhythm regular, heart sounds  normal, no murmurs or gallops, no peripheral edema Musculoskeletal: No deformities, no cyanosis or clubbing        Assessment & Plan:

## 2012-01-26 NOTE — Patient Instructions (Signed)
Your CPAP is set at 10 cm 
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
## 2012-01-29 NOTE — Assessment & Plan Note (Signed)
Much improved with cpap Weight loss encouraged, compliance with goal of at least 4-6 hrs every night is the expectation. Advised against medications with sedative side effects Cautioned against driving when sleepy - understanding that sleepiness will vary on a day to day basis

## 2012-02-06 ENCOUNTER — Other Ambulatory Visit: Payer: Self-pay

## 2012-02-06 ENCOUNTER — Other Ambulatory Visit: Payer: Self-pay | Admitting: *Deleted

## 2012-02-06 MED ORDER — LOSARTAN POTASSIUM 100 MG PO TABS: 100.0000 mg | ORAL_TABLET | Freq: Every day | ORAL | Status: DC

## 2012-02-06 NOTE — Telephone Encounter (Signed)
Refill sent to [facility].Marland Kitchen losartan

## 2012-03-12 ENCOUNTER — Telehealth: Payer: Self-pay | Admitting: Internal Medicine

## 2012-03-12 NOTE — Telephone Encounter (Signed)
Jonathan Medina is requesting an RX for a stair lift elevator.  He has bouts of gout and plantar fascitis and foot pain.

## 2012-03-13 NOTE — Telephone Encounter (Signed)
Letter/Rx done

## 2012-04-04 ENCOUNTER — Other Ambulatory Visit: Payer: Self-pay

## 2012-04-04 ENCOUNTER — Ambulatory Visit (INDEPENDENT_AMBULATORY_CARE_PROVIDER_SITE_OTHER): Payer: Medicare Other

## 2012-04-04 DIAGNOSIS — Z23 Encounter for immunization: Secondary | ICD-10-CM | POA: Diagnosis not present

## 2012-04-04 MED ORDER — CARVEDILOL PHOSPHATE ER 20 MG PO CP24: 20.0000 mg | ORAL_CAPSULE | Freq: Every day | ORAL | Status: DC

## 2012-04-04 MED ORDER — AMLODIPINE BESYLATE 10 MG PO TABS: 10.0000 mg | ORAL_TABLET | Freq: Every day | ORAL | Status: DC

## 2012-04-10 DIAGNOSIS — G609 Hereditary and idiopathic neuropathy, unspecified: Secondary | ICD-10-CM | POA: Diagnosis not present

## 2012-04-11 ENCOUNTER — Telehealth: Payer: Self-pay | Admitting: Internal Medicine

## 2012-04-11 ENCOUNTER — Telehealth: Payer: Self-pay | Admitting: Pulmonary Disease

## 2012-04-11 NOTE — Telephone Encounter (Signed)
Forward 3 pages from [facility] @ [facility] to Dr. Illene Regulus for review on 04-11-12 ym

## 2012-04-11 NOTE — Telephone Encounter (Signed)
Download 9/13 on 10 cm shows good usage avg 5h, few residual events AHI 5/h, some leak No changes

## 2012-04-12 NOTE — Telephone Encounter (Signed)
I spoke with patient about results and he verbalized understanding and had no questions 

## 2012-04-19 ENCOUNTER — Encounter: Payer: Self-pay | Admitting: Pulmonary Disease

## 2012-06-11 ENCOUNTER — Telehealth: Payer: Self-pay | Admitting: Internal Medicine

## 2012-06-11 NOTE — Telephone Encounter (Signed)
Message copied by Newell Coral on Mon Jun 11, 2012 10:42 AM ------      Message from: Illene Regulus E      Created: Wed Jun 06, 2012  7:22 PM       Please call  Mr. Bala with this message. At this time Dr. Arbutus Leas, with [place], only sees movement disorder patients. We will have a new neurologist soon. For now Dr. Debby Bud can manage your peripheral neuropathy and once our new neurologist is on board we can get a consult.            Thanks for you help

## 2012-06-11 NOTE — Telephone Encounter (Signed)
Spoke with patient, he understands, thanks!

## 2012-06-21 ENCOUNTER — Ambulatory Visit (INDEPENDENT_AMBULATORY_CARE_PROVIDER_SITE_OTHER): Payer: Medicare Other | Admitting: Internal Medicine

## 2012-06-21 ENCOUNTER — Encounter: Payer: Self-pay | Admitting: Internal Medicine

## 2012-06-21 ENCOUNTER — Other Ambulatory Visit (INDEPENDENT_AMBULATORY_CARE_PROVIDER_SITE_OTHER): Payer: Medicare Other

## 2012-06-21 VITALS — BP 124/64 | HR 75 | Temp 97.9°F | Resp 12 | Wt 231.0 lb

## 2012-06-21 DIAGNOSIS — E785 Hyperlipidemia, unspecified: Secondary | ICD-10-CM

## 2012-06-21 DIAGNOSIS — I10 Essential (primary) hypertension: Secondary | ICD-10-CM

## 2012-06-21 DIAGNOSIS — M109 Gout, unspecified: Secondary | ICD-10-CM | POA: Diagnosis not present

## 2012-06-21 DIAGNOSIS — E875 Hyperkalemia: Secondary | ICD-10-CM

## 2012-06-21 LAB — CBC WITH DIFFERENTIAL/PLATELET
Basophils Relative: 1.1 % (ref 0.0–3.0)
Eosinophils Relative: 4.3 % (ref 0.0–5.0)
HCT: 41.6 % (ref 39.0–52.0)
Hemoglobin: 13.9 g/dL (ref 13.0–17.0)
Lymphs Abs: 2.4 10*3/uL (ref 0.7–4.0)
MCV: 90.2 fl (ref 78.0–100.0)
Monocytes Relative: 7.7 % (ref 3.0–12.0)
Neutro Abs: 3.7 10*3/uL (ref 1.4–7.7)
RBC: 4.61 Mil/uL (ref 4.22–5.81)
WBC: 7 10*3/uL (ref 4.5–10.5)

## 2012-06-21 LAB — HEPATIC FUNCTION PANEL
ALT: 14 U/L (ref 0–53)
AST: 15 U/L (ref 0–37)
Bilirubin, Direct: 0.1 mg/dL (ref 0.0–0.3)
Total Bilirubin: 0.8 mg/dL (ref 0.3–1.2)

## 2012-06-21 LAB — COMPREHENSIVE METABOLIC PANEL
ALT: 14 U/L (ref 0–53)
AST: 15 U/L (ref 0–37)
Calcium: 9.6 mg/dL (ref 8.4–10.5)
Chloride: 102 mEq/L (ref 96–112)
Creatinine, Ser: 1 mg/dL (ref 0.4–1.5)
Sodium: 140 mEq/L (ref 135–145)
Total Protein: 7.2 g/dL (ref 6.0–8.3)

## 2012-06-21 LAB — LIPID PANEL
Cholesterol: 215 mg/dL — ABNORMAL HIGH (ref 0–200)
Total CHOL/HDL Ratio: 5
VLDL: 31.8 mg/dL (ref 0.0–40.0)

## 2012-06-21 LAB — LDL CHOLESTEROL, DIRECT: Direct LDL: 152.2 mg/dL

## 2012-06-21 NOTE — Progress Notes (Signed)
  Subjective:    Patient ID: Jonathan Medina, male    DOB: July 30, 1929, 78 y.o.   MRN: [id number]  HPI Jonathan Medina presents requesting routine lab that can be faxed to the [place]. He is feeling well.   PMH, FamHx and SocHx reviewed for any changes and relevance. Current Outpatient Prescriptions on File Prior to Visit  Medication Sig Dispense Refill  . amLODipine (NORVASC) 10 MG tablet Take 1 tablet (10 mg total) by mouth daily.  90 tablet  3  . aspirin 81 MG tablet Take 81 mg by mouth daily.        . calcium citrate-vitamin D (CITRACAL+D) 315-200 MG-UNIT per tablet Take 1 tablet by mouth daily.        . carvedilol (COREG CR) 20 MG 24 hr capsule Take 1 capsule (20 mg total) by mouth daily.  90 capsule  3  . colchicine 0.6 MG tablet Take 1 tablet (0.6 mg total) by mouth 3 (three) times daily as needed (gout).  12 tablet  0  . DULoxetine (CYMBALTA) 60 MG capsule Take 60 mg by mouth 3 (three) times daily.       Marland Kitchen gabapentin (NEURONTIN) 100 MG capsule Take 100 mg by mouth 3 (three) times daily.        Marland Kitchen losartan (COZAAR) 100 MG tablet Take 1 tablet (100 mg total) by mouth daily.  90 tablet  3  . naproxen sodium (ANAPROX) 220 MG tablet Take 220 mg by mouth as needed.      . Tamsulosin HCl (FLOMAX) 0.4 MG CAPS Take 0.4 mg by mouth daily.            Review of Systems System review is negative for any constitutional, cardiac, pulmonary, GI or neuro symptoms or complaints other than as described in the HPI.     Objective:   Physical Exam Filed Vitals:   06/21/12 1031  BP: 124/64  Pulse: 75  Temp: 97.9 F (36.6 C)  Resp: 12   Gen'l- overweight AA man in no distress HEENT- C&S clear Cor 2+ radial pulse, IRIR, no murmurs Pulm - normal respirations.  Lab Results  Component Value Date   WBC 7.0 06/21/2012   HGB 13.9 06/21/2012   HCT 41.6 06/21/2012   PLT 289.0 06/21/2012   GLUCOSE 91 06/21/2012   CHOL 215* 06/21/2012   TRIG 159.0* 06/21/2012   HDL 43.20 06/21/2012   LDLDIRECT 152.2  06/21/2012   LDLCALC 119* 05/17/2010   ALT 14 06/21/2012   ALT 14 06/21/2012   AST 15 06/21/2012   AST 15 06/21/2012   NA 140 06/21/2012   K 5.0 06/21/2012   CL 102 06/21/2012   CREATININE 1.0 06/21/2012   BUN 15 06/21/2012   CO2 30 06/21/2012   TSH 1.44 06/21/2012   PSA 3.16 06/24/2008   INR 0.9 08/07/2007   HGBA1C 6.2 10/09/2008         Assessment & Plan:  Generally doing well. He will wait to see [facility] neurology when available but will continue all his medications in the meantime. Labs to be sent to patient who can send or take to [facility]

## 2012-06-21 NOTE — Patient Instructions (Addendum)
Good to see you this holiday season  Labs are ordered and results will be sent to the [place] and to you either by mail or MyChart  You are in pretty good condition for the condition you are in.

## 2012-06-25 NOTE — Assessment & Plan Note (Signed)
LDL at 152 is above goal of 130 or less (NCEP-ATPIII) but below threshold for medical therapy.  Plan He will discuss with [facility].

## 2012-06-25 NOTE — Assessment & Plan Note (Signed)
BP Readings from Last 3 Encounters:  06/21/12 124/64  01/26/12 108/68  01/11/12 132/80   Good control. Labs are good too.

## 2012-06-29 ENCOUNTER — Encounter: Payer: Self-pay | Admitting: Internal Medicine

## 2012-08-14 DIAGNOSIS — N401 Enlarged prostate with lower urinary tract symptoms: Secondary | ICD-10-CM | POA: Diagnosis not present

## 2012-08-20 DIAGNOSIS — N401 Enlarged prostate with lower urinary tract symptoms: Secondary | ICD-10-CM | POA: Diagnosis not present

## 2012-08-20 DIAGNOSIS — D075 Carcinoma in situ of prostate: Secondary | ICD-10-CM | POA: Diagnosis not present

## 2012-08-20 DIAGNOSIS — R972 Elevated prostate specific antigen [PSA]: Secondary | ICD-10-CM | POA: Diagnosis not present

## 2012-08-20 DIAGNOSIS — E291 Testicular hypofunction: Secondary | ICD-10-CM | POA: Diagnosis not present

## 2012-10-24 ENCOUNTER — Telehealth: Payer: Self-pay

## 2012-10-24 NOTE — Telephone Encounter (Signed)
Ok 90d each no ref; OV w/Dr Norins in 2-3 mo Thx

## 2012-10-24 NOTE — Telephone Encounter (Signed)
Phone call from pt stating the neurologist he had, Dr Hollice Espy, moved out of state and Dr Debby Bud stated he would refill his Cymbalta and Gabapentin in his place. He is requesting a 90 day supply be sent to [facility] for Cymbalta 100 mg TID and Gabapentin 300 mg TID. I did not see these prescription written as such on his med list. Please advise.

## 2012-10-25 MED ORDER — GABAPENTIN 300 MG PO CAPS: 300.0000 mg | ORAL_CAPSULE | Freq: Three times a day (TID) | ORAL | Status: DC

## 2012-10-25 MED ORDER — DULOXETINE HCL 60 MG PO CPEP: 60.0000 mg | ORAL_CAPSULE | Freq: Three times a day (TID) | ORAL | Status: DC

## 2012-10-25 NOTE — Telephone Encounter (Signed)
I verified with pt and he states he takes Cymbalta 60 mg TID

## 2012-10-30 ENCOUNTER — Telehealth: Payer: Self-pay | Admitting: Pulmonary Disease

## 2012-10-30 NOTE — Telephone Encounter (Signed)
I spoke with the pt and he states that he needs a copy of his sleep study and cpap download to take with him to [place]. I asked if he wants these mailed and he states no he is coming for OV on 11-23-12 with [facility] and will get copies then. I advised the pt to ask nurse at visit. Carron Curie, CMA

## 2012-11-11 ENCOUNTER — Telehealth: Payer: Self-pay | Admitting: Pulmonary Disease

## 2012-11-11 NOTE — Telephone Encounter (Signed)
Download 9/13 good compliance, residuals 5/h

## 2012-11-20 DIAGNOSIS — H0230 Blepharochalasis unspecified eye, unspecified eyelid: Secondary | ICD-10-CM | POA: Diagnosis not present

## 2012-11-20 DIAGNOSIS — H2510 Age-related nuclear cataract, unspecified eye: Secondary | ICD-10-CM | POA: Diagnosis not present

## 2012-11-21 DIAGNOSIS — R972 Elevated prostate specific antigen [PSA]: Secondary | ICD-10-CM | POA: Diagnosis not present

## 2012-11-21 DIAGNOSIS — E291 Testicular hypofunction: Secondary | ICD-10-CM | POA: Diagnosis not present

## 2012-11-23 ENCOUNTER — Ambulatory Visit (INDEPENDENT_AMBULATORY_CARE_PROVIDER_SITE_OTHER): Payer: Medicare Other | Admitting: Pulmonary Disease

## 2012-11-23 ENCOUNTER — Encounter: Payer: Self-pay | Admitting: Pulmonary Disease

## 2012-11-23 VITALS — BP 138/70 | HR 74 | Temp 98.4°F | Ht 68.0 in | Wt 236.0 lb

## 2012-11-23 DIAGNOSIS — G4730 Sleep apnea, unspecified: Secondary | ICD-10-CM | POA: Diagnosis not present

## 2012-11-23 NOTE — Progress Notes (Signed)
  Subjective:    Patient ID: Jonathan Medina, male    DOB: 1930/06/27, 79 y.o.   MRN: [id number]  HPI 82/M, [name], ex smoker for FU of obstructive sleep apnea & chronic insomnia/ shift work disorder  He has PTSD & takes cymbalta daytime for many years. he also has long standing insomnia of sleep initiation & maintenance, since his youth. 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  Nocturnal PSg in dec'11 showed poor sleep efficiency with TST of 197 mins, AHI 12/h , desatn < 88% x 26 mins & lowest d esaturation to 76%. BMI was 35  Download 2/8-09/07/10 >> 5 h compliance, 10 cm OK  New face mask helped,  Download 3/1-3/30/12 >> 4.5 h, no events  Download 9/13 on 10 cm shows good usage avg 5h, few residual events AHI 5/h, some leak  No changes  11/23/2012  Pt states he wears his CPAP everynight 5-6 hrs a night. Pt gets his masked changed often so no problems/ no problems with machine (only when there is bad weather).   raises humidifier setting for dry mouth  Wife is very pleased - nightmares have stopped, no snoring.  Feels rested, no leak, pressure ok.  Changed to medium pillows  Takes cymbalta & neurontin at night   Review of Systems  neg for any significant sore throat, dysphagia, itching, sneezing, nasal congestion or excess/ purulent secretions, fever, chills, sweats, unintended wt loss, pleuritic or exertional cp, hempoptysis, orthopnea pnd or change in chronic leg swelling. Also denies presyncope, palpitations, heartburn, abdominal pain, nausea, vomiting, diarrhea or change in bowel or urinary habits, dysuria,hematuria, rash, arthralgias, visual complaints, headache, numbness weakness or ataxia.     Objective:   Physical Exam  Gen. Pleasant, obese, in no distress ENT - no lesions, no post nasal drip Neck: No JVD, no thyromegaly, no carotid bruits Lungs: no use of accessory muscles, no dullness to  percussion, decreased without rales or rhonchi  Cardiovascular: Rhythm regular, heart sounds  normal, no murmurs or gallops, no peripheral edema Musculoskeletal: No deformities, no cyanosis or clubbing , no tremors         Assessment & Plan:

## 2012-11-23 NOTE — Patient Instructions (Signed)
Your CPAP is  Set at 10 cm

## 2012-11-23 NOTE — Assessment & Plan Note (Signed)
Ct CPAP 10 cm with medium pillows & humidity Weight loss encouraged, compliance with goal of at least 4-6 hrs every night is the expectation. Advised against medications with sedative side effects Cautioned against driving when sleepy - understanding that sleepiness will vary on a day to day basis

## 2012-11-28 DIAGNOSIS — N401 Enlarged prostate with lower urinary tract symptoms: Secondary | ICD-10-CM | POA: Diagnosis not present

## 2012-11-28 DIAGNOSIS — R972 Elevated prostate specific antigen [PSA]: Secondary | ICD-10-CM | POA: Diagnosis not present

## 2012-11-28 DIAGNOSIS — E291 Testicular hypofunction: Secondary | ICD-10-CM | POA: Diagnosis not present

## 2012-12-06 DIAGNOSIS — M109 Gout, unspecified: Secondary | ICD-10-CM | POA: Diagnosis not present

## 2013-01-03 ENCOUNTER — Ambulatory Visit (INDEPENDENT_AMBULATORY_CARE_PROVIDER_SITE_OTHER): Payer: Medicare Other | Admitting: Internal Medicine

## 2013-01-03 ENCOUNTER — Other Ambulatory Visit (INDEPENDENT_AMBULATORY_CARE_PROVIDER_SITE_OTHER): Payer: Medicare Other

## 2013-01-03 ENCOUNTER — Encounter: Payer: Self-pay | Admitting: Internal Medicine

## 2013-01-03 VITALS — BP 126/66 | HR 62 | Temp 97.8°F | Resp 16 | Ht 68.0 in | Wt 231.4 lb

## 2013-01-03 DIAGNOSIS — E785 Hyperlipidemia, unspecified: Secondary | ICD-10-CM | POA: Diagnosis not present

## 2013-01-03 DIAGNOSIS — G609 Hereditary and idiopathic neuropathy, unspecified: Secondary | ICD-10-CM

## 2013-01-03 DIAGNOSIS — G4730 Sleep apnea, unspecified: Secondary | ICD-10-CM

## 2013-01-03 DIAGNOSIS — I10 Essential (primary) hypertension: Secondary | ICD-10-CM

## 2013-01-03 DIAGNOSIS — M109 Gout, unspecified: Secondary | ICD-10-CM

## 2013-01-03 DIAGNOSIS — Z0000 Encounter for general adult medical examination without abnormal findings: Secondary | ICD-10-CM

## 2013-01-03 LAB — COMPREHENSIVE METABOLIC PANEL
ALT: 14 U/L (ref 0–53)
AST: 18 U/L (ref 0–37)
Albumin: 4.2 g/dL (ref 3.5–5.2)
Alkaline Phosphatase: 64 U/L (ref 39–117)
BUN: 15 mg/dL (ref 6–23)
CO2: 28 mEq/L (ref 19–32)
Calcium: 9.5 mg/dL (ref 8.4–10.5)
Chloride: 104 mEq/L (ref 96–112)
Creatinine, Ser: 1 mg/dL (ref 0.4–1.5)
GFR: 94.39 mL/min (ref >60.00)
Glucose, Bld: 99 mg/dL (ref 70–99)
Potassium: 4.9 mEq/L (ref 3.5–5.1)
Sodium: 140 mEq/L (ref 135–145)
Total Bilirubin: 0.7 mg/dL (ref 0.3–1.2)
Total Protein: 7.6 g/dL (ref 6.0–8.3)

## 2013-01-03 LAB — HEPATIC FUNCTION PANEL
ALT: 14 U/L (ref 0–53)
AST: 18 U/L (ref 0–37)
Albumin: 4.2 g/dL (ref 3.5–5.2)
Alkaline Phosphatase: 64 U/L (ref 39–117)
Bilirubin, Direct: 0.1 mg/dL (ref 0.0–0.3)
Total Bilirubin: 0.7 mg/dL (ref 0.3–1.2)
Total Protein: 7.6 g/dL (ref 6.0–8.3)

## 2013-01-03 LAB — URIC ACID: Uric Acid, Serum: 6 mg/dL (ref 4.0–7.8)

## 2013-01-03 LAB — LIPID PANEL: Total CHOL/HDL Ratio: 5

## 2013-01-03 LAB — LDL CHOLESTEROL, DIRECT: Direct LDL: 152.2 mg/dL

## 2013-01-03 NOTE — Patient Instructions (Addendum)
Good to see you. You are doing well.  Lab will be posted to MyChart  Your are cleared for surgery

## 2013-01-03 NOTE — Progress Notes (Signed)
Subjective:    Patient ID: Jonathan Medina, male    DOB: 11/17/1929, 79 y.o.   MRN: [id number]  HPI The patient is here for annual Medicare wellness examination and management of other chronic and acute problems.  He is scheduled for prostate biopsy due to rising PSA.   He is current with Dr. Vassie Loll in regard to OSA - still using CPAP  No flares of gout  Peripheral neuropathy has been stable.    The risk factors are reflected in the social history.  The roster of all physicians providing medical care to patient - is listed in the Snapshot section of the chart.  Activities of daily living:  The patient is 100% inedpendent in all ADLs: dressing, toileting, feeding as well as independent mobility  Home safety : The patient has smoke detectors in the home. Falls - none in the last 6 months. Home is fall safe and he has a stair lift. They wear seatbelts.   There is no risks for hepatitis, STDs or HIV. There is no history of blood transfusion. They have no travel history to infectious disease endemic areas of the world.  The patient has seen their dentist in the last six month. They have seen their eye doctor in the last year. They deny any hearing difficulty and have not had audiologic testing in the last year.    They do not  have excessive sun exposure. Discussed the need for sun protection: hats, long sleeves and use of sunscreen if there is significant sun exposure.   Diet: the importance of a healthy diet is discussed. They do have a healthy  diet.  The patient has no regular exercise program.  The benefits of regular aerobic exercise were discussed.  Depression screen: there are no signs or vegative symptoms of depression- irritability, change in appetite, anhedonia, sadness/tearfullness.  Cognitive assessment: the patient manages all their financial and personal affairs and is actively engaged.   The following portions of the patient's history were reviewed and updated as  appropriate: allergies, current medications, past family history, past medical history,  past surgical history, past social history  and problem list.  Vision, hearing, body mass index were assessed and reviewed.   During the course of the visit the patient was educated and counseled about appropriate screening and preventive services including : fall prevention , diabetes screening, nutrition counseling, colorectal cancer screening, and recommended immunizations.  Past Medical History  Diagnosis Date  . Hyperlipemia   . Hypertension   . Gout   . Pulmonary nodule   . Multiple thyroid nodules   . History of colonic polyps    Past Surgical History  Procedure Laterality Date  . Minithoracotomy     Family History  Problem Relation Age of Onset  . Prostate cancer Father   . Bone cancer Mother    History   Social History  . Marital Status: Married    Spouse Name: N/A    Number of Children: 4  . Years of Education: 15   Occupational History  . Retired   .     Social History Main Topics  . Smoking status: Former Smoker -- 1.00 packs/day for 50 years    Quit date: 07/04/1998  . Smokeless tobacco: Never Used  . Alcohol Use: No  . Drug Use: No  . Sexually Active: Not on file   Other Topics Concern  . Not on file   Social History Narrative   [employer]  all but finished engineering.  [employer] - 18 months-[place] veteran. Married '60 2 sons - '61,'61; 2 daughters - '59, '63; 3 grandchildren. 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  Lives with wife. ACP - discussed with patient and provided HCPOA and Living Will (July '14).    Current Outpatient Prescriptions on File Prior to Visit  Medication Sig Dispense Refill  . amLODipine (NORVASC) 10 MG tablet Take 1 tablet (10 mg total) by mouth daily.  90 tablet  3  . aspirin 81 MG tablet Take 81 mg by mouth daily.        . calcium citrate-vitamin D (CITRACAL+D) 315-200 MG-UNIT per tablet Take 1 tablet by mouth daily.        . carvedilol (COREG CR)  20 MG 24 hr capsule Take 1 capsule (20 mg total) by mouth daily.  90 capsule  3  . DULoxetine (CYMBALTA) 60 MG capsule Take 1 capsule (60 mg total) by mouth 3 (three) times daily.  180 capsule  0  . gabapentin (NEURONTIN) 300 MG capsule Take 1 capsule (300 mg total) by mouth 3 (three) times daily.  180 capsule  0  . losartan (COZAAR) 100 MG tablet Take 1 tablet (100 mg total) by mouth daily.  90 tablet  3  . Tamsulosin HCl (FLOMAX) 0.4 MG CAPS Take 0.4 mg by mouth daily.        . colchicine 0.6 MG tablet Take 1 tablet (0.6 mg total) by mouth 3 (three) times daily as needed (gout).  12 tablet  0   No current facility-administered medications on file prior to visit.      Review of Systems Constitutional:  Negative for fever, chills, activity change and unexpected weight change. Has a h/o sweats associated witjh peripheral neuropathy HEENT:  Negative for hearing loss, ear pain, congestion, neck stiffness and postnasal drip. Negative for sore throat or swallowing problems. Negative for dental complaints.   Eyes: Negative for vision loss or change in visual acuity.  Respiratory: Negative for chest tightness and wheezing. Negative for DOE.   Cardiovascular: Negative for chest pain or palpitations. No decreased exercise tolerance Gastrointestinal: No change in bowel habit. No bloating or gas. No reflux or indigestion Genitourinary: Negative for urgency, frequency, flank pain and difficulty urinating. Nocturia x 4 Musculoskeletal: Negative for myalgias, back pain, arthralgias and gait problem.  Neurological: Negative for dizziness, tremors, weakness and headaches.  Hematological: Negative for adenopathy.  Psychiatric/Behavioral: Negative for behavioral problems and dysphoric mood.       Objective:   Physical Exam Filed Vitals:   01/03/13 1358  BP: 126/66  Pulse: 62  Temp: 97.8 F (36.6 C)  Resp: 16   Wt Readings from Last 3 Encounters:  01/03/13 231 lb 6.4 oz (104.962 kg)  11/23/12 236  lb (107.049 kg)  06/21/12 231 lb (104.781 kg)   Gen'l: Well nourished well developed AA male in no acute distress  HEENT: Head: Normocephalic and atraumatic. Right Ear: External ear normal. EAC/TM nl. Left Ear: External ear normal.  EAC/TM nl. Nose: Nose normal. Mouth/Throat: Oropharynx is clear and moist. Dentition - native, in good repair. No buccal or palatal lesions. Posterior pharynx clear. Eyes: Conjunctivae and sclera clear. EOM intact. Pupils are equal, round, and reactive to light. Right eye exhibits no discharge. Left eye exhibits no discharge. Neck: Normal range of motion. Neck supple. No JVD present. No tracheal deviation present. No thyromegaly present.  Cardiovascular: Normal rate, regular rhythm, no gallop, no friction rub, no murmur heard.      Quiet precordium. 2+ radial  and DP pulses . No carotid bruits Pulmonary/Chest: Effort normal. No respiratory distress or increased WOB, no wheezes, no rales. No chest wall deformity or CVAT. Abdomen: Soft. Bowel sounds are normal in all quadrants. He exhibits no distension, no tenderness, no rebound or guarding, No heptosplenomegaly  Genitourinary:  deferred Musculoskeletal: Normal range of motion. He exhibits no edema and no tenderness.       Small and large joints without redness, synovial thickening or deformity. Full range of motion preserved about all small, median and large joints.  Lymphadenopathy:    He has no cervical or supraclavicular adenopathy.  Neurological: He is alert and oriented to person, place, and time. CN II-XII intact. DTRs 2+ and symmetrical biceps, radial and patellar tendons. Cerebellar function normal with no tremor, rigidity, normal gait and station.  Skin: Skin is warm and dry. No rash noted. No erythema.  Psychiatric: He has a normal mood and affect. His behavior is normal. Thought content normal.   Recent Results (from the past 2160 hour(s))  HEPATIC FUNCTION PANEL     Status: None   Collection Time     01/03/13  2:52 PM      Result Value Range   Total Bilirubin 0.7  0.3 - 1.2 mg/dL   Bilirubin, Direct 0.1  0.0 - 0.3 mg/dL   Alkaline Phosphatase 64  39 - 117 U/L   AST 18  0 - 37 U/L   ALT 14  0 - 53 U/L   Total Protein 7.6  6.0 - 8.3 g/dL   Albumin 4.2  3.5 - 5.2 g/dL  COMPREHENSIVE METABOLIC PANEL     Status: None   Collection Time    01/03/13  2:52 PM      Result Value Range   Sodium 140  135 - 145 mEq/L   Potassium 4.9  3.5 - 5.1 mEq/L   Chloride 104  96 - 112 mEq/L   CO2 28  19 - 32 mEq/L   Glucose, Bld 99  70 - 99 mg/dL   BUN 15  6 - 23 mg/dL   Creatinine, Ser 1.0  0.4 - 1.5 mg/dL   Total Bilirubin 0.7  0.3 - 1.2 mg/dL   Alkaline Phosphatase 64  39 - 117 U/L   AST 18  0 - 37 U/L   ALT 14  0 - 53 U/L   Total Protein 7.6  6.0 - 8.3 g/dL   Albumin 4.2  3.5 - 5.2 g/dL   Calcium 9.5  8.4 - 16.1 mg/dL   GFR 09.60  >45.40 mL/min  LIPID PANEL     Status: Abnormal   Collection Time    01/03/13  2:52 PM      Result Value Range   Cholesterol 228 (*) 0 - 200 mg/dL   Comment: ATP III Classification       Desirable:  < 200 mg/dL               Borderline High:  200 - 239 mg/dL          High:  > = 981 mg/dL   Triglycerides 191.4 (*) 0.0 - 149.0 mg/dL   Comment: Normal:  <782 mg/dLBorderline High:  150 - 199 mg/dL   HDL 95.62  >13.08 mg/dL   VLDL 65.7  0.0 - 84.6 mg/dL   Total CHOL/HDL Ratio 5     Comment:                Men  Women1/2 Average Risk     3.4          3.3Average Risk          5.0          4.42X Average Risk          9.6          7.13X Average Risk          15.0          11.0                      URIC ACID     Status: None   Collection Time    01/03/13  2:52 PM      Result Value Range   Uric Acid, Serum 6.0  4.0 - 7.8 mg/dL  LDL CHOLESTEROL, DIRECT     Status: None   Collection Time    01/03/13  2:52 PM      Result Value Range   Direct LDL 152.2     Comment: Optimal:  <100 mg/dLNear or Above Optimal:  100-129 mg/dLBorderline High:  130-159 mg/dLHigh:   160-189 mg/dLVery High:  >190 mg/dL           Assessment & Plan:

## 2013-01-06 DIAGNOSIS — Z0000 Encounter for general adult medical examination without abnormal findings: Secondary | ICD-10-CM | POA: Insufficient documentation

## 2013-01-06 NOTE — Assessment & Plan Note (Signed)
Patient with continued pain. He wishes to establish with a new neurologist for continued follow up.   Plan Referral order placed.   Continue on cymbalta from which he has had positive relief of symptoms.

## 2013-01-06 NOTE — Assessment & Plan Note (Addendum)
Lab reveals LDL 152,HDL 47. He is below threshold for treatment per NCEP ATP III with low to moderate risk for CAD.  PLan - will discuss cardiac risk factors and 10 year risk at next office visit and make a decision about medical treatment

## 2013-01-06 NOTE — Assessment & Plan Note (Signed)
No recent flares of gout. Uric acid level is 6.0 = normal range.

## 2013-01-06 NOTE — Assessment & Plan Note (Signed)
He continues to use CPAP w/o difficulty and reported good adherence.  Plan Follow up with sleep medicine as instructed  Continue CPAP

## 2013-01-06 NOTE — Assessment & Plan Note (Signed)
Interval history without major illness, hospitalization or surgery. General exam is normal, Lab results reviewed and notable for elevated LDL cholesterol. No report of colonoscopy in EPIC but at age 79 with no change in bowel habit or GI complaints will not refer for colonoscopy. Immunizations - up to date.   In summary A very pleasant man who appears to be medically stable at this time but we do need to have a conversation about his cholesterol. Did review ACP with him and provided packet with HCPOA and Living Will. He will return at his convenience in the next 3 months.

## 2013-01-06 NOTE — Assessment & Plan Note (Signed)
BP Readings from Last 3 Encounters:  01/03/13 126/66  11/23/12 138/70  06/21/12 124/64   Good control on present medications. Lab reveals normal renal function and 'lytes.  Plan Continue present regimen

## 2013-01-09 DIAGNOSIS — C61 Malignant neoplasm of prostate: Secondary | ICD-10-CM | POA: Insufficient documentation

## 2013-01-09 DIAGNOSIS — D075 Carcinoma in situ of prostate: Secondary | ICD-10-CM | POA: Diagnosis not present

## 2013-01-09 DIAGNOSIS — R972 Elevated prostate specific antigen [PSA]: Secondary | ICD-10-CM | POA: Diagnosis not present

## 2013-02-04 ENCOUNTER — Other Ambulatory Visit: Payer: Self-pay | Admitting: Internal Medicine

## 2013-02-06 DIAGNOSIS — N529 Male erectile dysfunction, unspecified: Secondary | ICD-10-CM | POA: Diagnosis not present

## 2013-02-06 DIAGNOSIS — N401 Enlarged prostate with lower urinary tract symptoms: Secondary | ICD-10-CM | POA: Diagnosis not present

## 2013-02-06 DIAGNOSIS — C61 Malignant neoplasm of prostate: Secondary | ICD-10-CM | POA: Diagnosis not present

## 2013-02-07 ENCOUNTER — Telehealth: Payer: Self-pay

## 2013-02-07 NOTE — Telephone Encounter (Signed)
Patient called, left a message that he was having trouble with his Lorsartan rx with [facility] and requested a call back. I called patient back and he states the issue has since been resolved.

## 2013-02-13 ENCOUNTER — Encounter: Payer: Self-pay | Admitting: Radiation Oncology

## 2013-02-13 DIAGNOSIS — C3490 Malignant neoplasm of unspecified part of unspecified bronchus or lung: Secondary | ICD-10-CM | POA: Insufficient documentation

## 2013-02-13 NOTE — Progress Notes (Signed)
GU Location of Tumor / Histology: prostate  If Prostate Cancer, Gleason Score is (3 + 4) and PSA is (8.83)  Patient presented 3years ago with signs/symptoms of: high grade prostatic intraepithelial neoplasia  Biopsies of prostate (if applicable) revealed: adenocarcinoma, 1 core, HGPIN in 2 cores  Past/Anticipated interventions by urology, if any: surveillance  Past/Anticipated interventions by medical oncology, if any: none  Weight changes, if any: none  Bowel/Bladder complaints, if any: aching, burning w/initiation of urination , increased frequency, nocturia x 4, sensation of not emptying, stream stops/starts, urgency, weak stream  Nausea/Vomiting, if any: none  Pain issues, if any:  none  SAFETY ISSUES:  Prior radiation? no  Pacemaker/ICD? no  Possible current pregnancy? na  Is the patient on methotrexate? no  Current Complaints / other details:  Married, retired from [employer], twin sons, 2 daughters, IPSS 22

## 2013-02-14 ENCOUNTER — Encounter: Payer: Self-pay | Admitting: Radiation Oncology

## 2013-02-14 ENCOUNTER — Ambulatory Visit
Admission: RE | Admit: 2013-02-14 | Discharge: 2013-02-14 | Disposition: A | Discharge status: Home / Self Care | Payer: Medicare Other | Source: Ambulatory Visit | Attending: Radiation Oncology | Admitting: Radiation Oncology

## 2013-02-14 VITALS — BP 144/76 | HR 67 | Temp 98.4°F | Resp 20 | Wt 232.8 lb

## 2013-02-14 DIAGNOSIS — Z862 Personal history of diseases of the blood and blood-forming organs and certain disorders involving the immune mechanism: Secondary | ICD-10-CM | POA: Diagnosis not present

## 2013-02-14 DIAGNOSIS — R350 Frequency of micturition: Secondary | ICD-10-CM | POA: Diagnosis not present

## 2013-02-14 DIAGNOSIS — C61 Malignant neoplasm of prostate: Secondary | ICD-10-CM | POA: Diagnosis not present

## 2013-02-14 DIAGNOSIS — R351 Nocturia: Secondary | ICD-10-CM | POA: Diagnosis not present

## 2013-02-14 DIAGNOSIS — Z79899 Other long term (current) drug therapy: Secondary | ICD-10-CM | POA: Insufficient documentation

## 2013-02-14 DIAGNOSIS — I10 Essential (primary) hypertension: Secondary | ICD-10-CM | POA: Insufficient documentation

## 2013-02-14 DIAGNOSIS — R339 Retention of urine, unspecified: Secondary | ICD-10-CM | POA: Insufficient documentation

## 2013-02-14 DIAGNOSIS — Z8639 Personal history of other endocrine, nutritional and metabolic disease: Secondary | ICD-10-CM | POA: Insufficient documentation

## 2013-02-14 DIAGNOSIS — J4489 Other specified chronic obstructive pulmonary disease: Secondary | ICD-10-CM | POA: Insufficient documentation

## 2013-02-14 DIAGNOSIS — J449 Chronic obstructive pulmonary disease, unspecified: Secondary | ICD-10-CM | POA: Diagnosis not present

## 2013-02-14 DIAGNOSIS — E785 Hyperlipidemia, unspecified: Secondary | ICD-10-CM | POA: Insufficient documentation

## 2013-02-14 DIAGNOSIS — Z91013 Allergy to seafood: Secondary | ICD-10-CM | POA: Diagnosis not present

## 2013-02-14 HISTORY — DX: Chronic obstructive pulmonary disease, unspecified: J44.9

## 2013-02-14 HISTORY — DX: Hereditary and idiopathic neuropathy, unspecified: G60.9

## 2013-02-14 HISTORY — DX: Male erectile dysfunction, unspecified: N52.9

## 2013-02-14 HISTORY — DX: Malignant neoplasm of prostate: C61

## 2013-02-14 HISTORY — DX: Testicular hypofunction: E29.1

## 2013-02-14 NOTE — Progress Notes (Signed)
Radiation Oncology         [phone] ________________________________  Initial outpatient Consultation  Name: Jonathan Medina MRN: [id number]  Date: 02/14/2013  DOB: [birth year]  [id number] Norins, MD  Anner Crete, MD   REFERRING PHYSICIAN: Anner Crete, MD  DIAGNOSIS: Stage TIc Gleason's 7  (3+4) low volume adenocarcinoma of the prostate  HISTORY OF PRESENT ILLNESS::Jonathan Medina is a 79 y.o. male who is seen out of the courtesy of Dr. Bjorn Pippin for an opinion concerning radiation therapy as part of management of the patient's adenocarcinoma of the prostate. Patient initially presented 2011 with the elevated PSA. Biopsy at that time showed high-grade prostatic intraepithelial neoplasia. His prostate volume at that time was 57 cc. More recently the patient's PSA had risen to 8.83 with a 3-4 year PSA doubling time. The patient proceeded to undergo repeat biopsy recently. Patient's prostate volume ultrasound was 68 cm. One core from the right mid lateral area showed Gleason 7 (3+4). This involved less than 5% of the tissue sampled. The patient at this time is deciding whether to continue with active surveillance or to consider definitive treatment. He is now seen in radiation oncology for further evaluation and discussion of management options...  PREVIOUS RADIATION THERAPY: No  PAST MEDICAL HISTORY:  has a past medical history of Hyperlipemia; Hypertension; Gout; Pulmonary nodule; Multiple thyroid nodules; History of colonic polyps; Prostate cancer (01/09/13); COPD (chronic obstructive pulmonary disease); Idiopathic peripheral neuropathy; BPH (benign prostatic hypertrophy) with urinary obstruction; Organic impotence; Hypogonadism male; and Lung cancer (2009).    PAST SURGICAL HISTORY: Past Surgical History  Procedure Laterality Date  . Minithoracotomy    . Excision of spermatocele    . Prostate biopsy  12/14/09, 01/09/13  . Forearm surgery      nonunion repair, bilateral    FAMILY  HISTORY: family history includes Bone cancer in his mother; Cancer in his brother and sister; Prostate cancer (age of onset: 53) in his father. prostate cancer in the patient's grandfather presenting at age 56.  SOCIAL HISTORY:  reports that he quit smoking about 14 years ago. He has never used smokeless tobacco. He reports that he does not drink alcohol or use illicit drugs.  ALLERGIES: Iodine; Nisoldipine; Shellfish allergy; and Simvastatin  MEDICATIONS:  Current Outpatient Prescriptions  Medication Sig Dispense Refill  . amLODipine (NORVASC) 10 MG tablet Take 1 tablet (10 mg total) by mouth daily.  90 tablet  3  . calcium citrate-vitamin D (CITRACAL+D) 315-200 MG-UNIT per tablet Take 1 tablet by mouth daily.        . carvedilol (COREG CR) 20 MG 24 hr capsule Take 1 capsule (20 mg total) by mouth daily.  90 capsule  3  . losartan (COZAAR) 100 MG tablet Take 1 tablet (100 mg total) by mouth daily.  90 tablet  3  . Tamsulosin HCl (FLOMAX) 0.4 MG CAPS Take 0.4 mg by mouth daily.        Marland Kitchen aspirin 81 MG tablet Take 81 mg by mouth daily.        . colchicine 0.6 MG tablet Take 1 tablet (0.6 mg total) by mouth 3 (three) times daily as needed (gout).  12 tablet  0  . DULoxetine (CYMBALTA) 60 MG capsule Take 1 capsule (60 mg total) by mouth 3 (three) times daily.  180 capsule  0  . gabapentin (NEURONTIN) 300 MG capsule Take 1 capsule (300 mg total) by mouth 3 (three) times daily.  180 capsule  0  No current facility-administered medications for this encounter.    REVIEW OF SYSTEMS:  A 15 point review of systems is documented in the electronic medical record. This was obtained by the nursing staff. However, I reviewed this with the patient to discuss relevant findings and make appropriate changes.  He denies any new bony pain. He does have a history of gout but has had no attacks since stopping consumption of red meat. The patient completed the international prostate symptom score with total score of  22 representing severe symptomatology. The most significant scores were in incomplete emptying, frequency and intermittency. He also score high with nocturia. Patient admits that his symptoms have been worse of his prostate biopsy.  In Dr. Belva Crome office the patient's score was 12. he denies any hematuria or rectal bleeding.   PHYSICAL EXAM:  weight is 232 lb 12.8 oz (105.597 kg). His oral temperature is 98.4 F (36.9 C). His blood pressure is 144/76 and his pulse is 67. His respiration is 20.   BP 144/76  Pulse 67  Temp(Src) 98.4 F (36.9 C) (Oral)  Resp 20  Wt 232 lb 12.8 oz (105.597 kg)  BMI 35.41 kg/m2  General Appearance:    Alert, cooperative, no distress, appears stated age,  accompanied by his wife and daughter on evaluation today   Head:    Normocephalic, without obvious abnormality, atraumatic  Eyes:    PERRL, conjunctiva/corneas clear, EOM's intact,       Nose:   Nares normal, septum midline, mucosa normal, no drainage    or sinus tenderness  Throat:   Lips, mucosa, and tongue normal; teeth and gums normal  Neck:   Supple, symmetrical, trachea midline, no adenopathy;       thyroid:  No enlargement/tenderness/nodules;     Back:     Symmetric, no curvature, ROM normal, no CVA tenderness  Lungs:     Clear to auscultation bilaterally, respirations unlabored  Chest wall:    No tenderness or deformity  Heart:    Regular rate and rhythm,     Abdomen:     Soft, non-tender, bowel sounds active all four quadrants,    no masses, no organomegaly  Genitalia:  Pt wished to defer   Rectal:  Pt wished to defer  Extremities:   Extremities normal, atraumatic, no cyanosis or edema  Pulses:   2+ and symmetric all extremities  Skin:   Skin color, texture, turgor normal, no rashes or lesions  Lymph nodes:   Cervical, supraclavicular, and axillary nodes normal  Neurologic:   Normal strength         KPS = 100  100 - Normal; no complaints; no evidence of disease. 90   - Able to carry on  normal activity; minor signs or symptoms of disease. 80   - Normal activity with effort; some signs or symptoms of disease. 35   - Cares for self; unable to carry on normal activity or to do active work. 60   - Requires occasional assistance, but is able to care for most of his personal needs. 50   - Requires considerable assistance and frequent medical care. 40   - Disabled; requires special care and assistance. 30   - Severely disabled; hospital admission is indicated although death not imminent. 20   - Very sick; hospital admission necessary; active supportive treatment necessary. 10   - Moribund; fatal processes progressing rapidly. 0     - Dead  Karnofsky DA, Abelmann WH, Craver LS and [facility] (  1948) The use of the nitrogen mustards in the palliative treatment of carcinoma: with particular reference to bronchogenic carcinoma Cancer 1 634-56  LABORATORY DATA:  Lab Results  Component Value Date   WBC 7.0 06/21/2012   HGB 13.9 06/21/2012   HCT 41.6 06/21/2012   MCV 90.2 06/21/2012   PLT 289.0 06/21/2012   Lab Results  Component Value Date   NA 140 01/03/2013   K 4.9 01/03/2013   CL 104 01/03/2013   CO2 28 01/03/2013   Lab Results  Component Value Date   ALT 14 01/03/2013   ALT 14 01/03/2013   AST 18 01/03/2013   AST 18 01/03/2013   ALKPHOS 64 01/03/2013   ALKPHOS 64 01/03/2013   BILITOT 0.7 01/03/2013   BILITOT 0.7 01/03/2013     RADIOGRAPHY: No results found.    IMPRESSION: Stage TIc Gleason's 7 adenocarcinoma prostate. I discussed the management options with the patient his wife and daughter. We discussed continued watchful waiting and his radiation therapy options. He would not be a good candidate for radioactive seed implantation in light of the prostate gland size of 68 cc. The patient would be a candidate for intensity modulated radiation therapy. I discussed this course of treatment side effects and potential long-term toxicities. At this time the patient feels comfortable with  continued close followup however if his PSA rises significantly with his next followup visit,  he will likely proceed with radiation therapy. It is somewhat concerning that his PSA has risen significantly despite being in a hypogonadal state.   PLAN: When necessary followup in radiation oncology. The patient will continue close followup in urology.  I spent 60 minutes minutes face to face with the patient and more than 50% of that time was spent in counseling and/or coordination of care.   ------------------------------------------------  -----------------------------------  Billie Lade, PhD, MD

## 2013-02-14 NOTE — Progress Notes (Signed)
Please see the Nurse Progress Note in the MD Initial Consult Encounter for this patient. 

## 2013-02-15 ENCOUNTER — Encounter: Payer: Self-pay | Admitting: Neurology

## 2013-02-15 ENCOUNTER — Ambulatory Visit (INDEPENDENT_AMBULATORY_CARE_PROVIDER_SITE_OTHER): Payer: Medicare Other | Admitting: Neurology

## 2013-02-15 VITALS — BP 130/70 | HR 68 | Temp 97.8°F | Ht 68.0 in | Wt 233.0 lb

## 2013-02-15 DIAGNOSIS — G609 Hereditary and idiopathic neuropathy, unspecified: Secondary | ICD-10-CM | POA: Diagnosis not present

## 2013-02-15 MED ORDER — GABAPENTIN 300 MG PO CAPS: 300.0000 mg | ORAL_CAPSULE | Freq: Three times a day (TID) | ORAL | Status: DC

## 2013-02-15 MED ORDER — DULOXETINE HCL 60 MG PO CPEP: 60.0000 mg | ORAL_CAPSULE | Freq: Three times a day (TID) | ORAL | Status: DC

## 2013-02-15 NOTE — Progress Notes (Signed)
NEUROLOGY CONSULTATION NOTE  Jonathan Medina MRN: [id number] DOB: [birth year]  Referring provider: Dr. Debby Bud Primary care provider: Dr. Debby Bud  Reason for consult:  Idiopathic peripheral neuropathy.  HISTORY OF PRESENT ILLNESS: Jonathan Medina is an 79 year old male with history of HTN, BPH, and hyperlipidemia, who presents to establish care regarding management of painful idiopathic peripheral neuropathy.  He is accompanied by his wife.  Records and images were personally reviewed where available.    He was previously followed at [facility].  He does not have diabetes and it was speculated that his neuropathy was secondary to extreme cold exposure during his service in the [place] War.  He had been treated symptomatically for the pain.  He had NCS-EMG performed, but results not available at this time.  He has experienced symptoms since his discharge from the service in 1954.  He describes a tingling, burning and sharp pain, as well as a severe aching pain predominantly in the thighs and hips, but also radiating up from the feet.  It wakes him up at night and can occur with all activities like standing and walking, as well as laying down and sitting.  He denies neck or back pain.  He notes weakness in the legs and has dragged his feet for years.  He ambulates with a cane or walker.  He has a couple of falls in the past, the last one a couple of years ago.  He denies numbness, however.  Symptoms seem to be worse during colder months.  He is sensitive to the cold and exhibits symptoms of Raynaud's, with discoloration of fingers in the cold.  For several years, he has been on gabapentin and Cymbalta.  Currently, he takes gabapentin 300mg  TID and Cymbalta 60mg  TID.  Labs: TSH 1.44, SPEP & IFE revealed nonspecific pattern but no M-spike, glucose 99.    He has a history of left 6th nerve palsy, which lasted 3 years and spontaneously resolved.  It came on suddenly.  He reportedly had MRI of  brain, which was unremarkable and etiology was unknown.  PAST MEDICAL HISTORY: Past Medical History  Diagnosis Date  . Hyperlipemia   . Hypertension   . Gout   . Pulmonary nodule   . Multiple thyroid nodules   . History of colonic polyps   . Prostate cancer 01/09/13    gleason 7, vol 68 cc  . COPD (chronic obstructive pulmonary disease)   . Idiopathic peripheral neuropathy   . BPH (benign prostatic hypertrophy) with urinary obstruction   . Organic impotence   . Hypogonadism male   . Lung cancer 2009    surgery    PAST SURGICAL HISTORY: Past Surgical History  Procedure Laterality Date  . Minithoracotomy    . Excision of spermatocele    . Prostate biopsy  12/14/09, 01/09/13  . Forearm surgery      nonunion repair, bilateral    MEDICATIONS: Current Outpatient Prescriptions on File Prior to Visit  Medication Sig Dispense Refill  . amLODipine (NORVASC) 10 MG tablet Take 1 tablet (10 mg total) by mouth daily.  90 tablet  3  . aspirin 81 MG tablet Take 81 mg by mouth daily.        . calcium citrate-vitamin D (CITRACAL+D) 315-200 MG-UNIT per tablet Take 1 tablet by mouth daily.        . carvedilol (COREG CR) 20 MG 24 hr capsule Take 1 capsule (20 mg total) by mouth daily.  90 capsule  3  .  losartan (COZAAR) 100 MG tablet Take 1 tablet (100 mg total) by mouth daily.  90 tablet  3  . Tamsulosin HCl (FLOMAX) 0.4 MG CAPS Take 0.4 mg by mouth daily.        . colchicine 0.6 MG tablet Take 1 tablet (0.6 mg total) by mouth 3 (three) times daily as needed (gout).  12 tablet  0   No current facility-administered medications on file prior to visit.    ALLERGIES: Allergies  Allergen Reactions  . Iodine     REACTION: tongue swelling, sweat  . Nisoldipine     REACTION: Dizzy  . Shellfish Allergy   . Simvastatin     REACTION: diarrhea, headaches    FAMILY HISTORY: Family History  Problem Relation Age of Onset  . Prostate cancer Father 42  . Bone cancer Mother   . Cancer Sister      breast in 1 sister  . Cancer Brother     throat    SOCIAL HISTORY: History   Social History  . Marital Status: Married    Spouse Name: N/A    Number of Children: 4  . Years of Education: 15   Occupational History  . Retired   .     Social History Main Topics  . Smoking status: Former Smoker -- 1.00 packs/day for 55 years    Quit date: 07/04/1998  . Smokeless tobacco: Never Used  . Alcohol Use: No     Comment: none since 2000  . Drug Use: No  . Sexual Activity: Not on file   Other Topics Concern  . Not on file   Social History Narrative   [employer]  all but finished engineering. [employer] - 18 months-[place] veteran. Married '60 2 sons - '61,'61; 2 daughters - '59, '63; 3 grandchildren. 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  Lives with wife. ACP - discussed with patient and provided HCPOA and Living Will (July '14).    REVIEW OF SYSTEMS: Constitutional: No fevers, chills, or sweats, no generalized fatigue, change in appetite Eyes: No visual changes, double vision, eye pain Ear, nose and throat: No hearing loss, ear pain, nasal congestion, sore throat Cardiovascular: No chest pain, palpitations Respiratory:  No shortness of breath at rest or with exertion, wheezes GastrointestinaI: No nausea, vomiting, diarrhea, abdominal pain, fecal incontinence Genitourinary:  No dysuria, urinary retention or frequency Musculoskeletal:  No neck pain, back pain Integumentary: No rash, pruritus, skin lesions Neurological: as above Psychiatric: No depression, insomnia, anxiety Endocrine: No palpitations, fatigue, diaphoresis, mood swings, change in appetite, change in weight, increased thirst Hematologic/Lymphatic:  No anemia, purpura, petechiae. Allergic/Immunologic: no itchy/runny eyes, nasal congestion, recent allergic reactions, rashes  PHYSICAL EXAM: Filed Vitals:   02/15/13 1319  BP: 130/70  Pulse: 68  Temp: 97.8 F (36.6 C)   General: No acute distress Head:   Normocephalic/atraumatic Neck: supple, no paraspinal tenderness, full range of motion Back: No paraspinal tenderness Heart: regular rate and rhythm Lungs: Clear to auscultation bilaterally. Vascular: No carotid bruits. Neurological Exam: Mental status: alert and oriented to person, place, and time, speech fluent and not dysarthric, language intact. Cranial nerves: CN I: not tested CN II: pupils equal, round and reactive to light, visual fields intact, fundi unremarkable. CN III, IV, VI:  full range of motion, no nystagmus, no ptosis CN V: facial sensation intact CN VII: upper and lower face symmetric CN VIII: hearing intact CN IX, X: gag intact, uvula midline CN XI: sternocleidomastoid and trapezius muscles intact CN XII: tongue midline Bulk &  Tone: normal, no fasciculations. Motor: 5/5 throughout Sensation: reduced temperature sensation in the toes (R>L), reduced vibration sensation in toes (L>R) Deep Tendon Reflexes: 2+ and symmetric in UEs, absent in LEs, toes down Finger to nose testing: normal Gait: ambulates with cane, able to pick up feet, no shuffling. Romberg negative.  IMPRESSION & PLAN: AKIA DESROCHES is a 79 y.o. male with history of idiopathic peripheral neuropathy.  Still notes pain but he says symptoms stable and tolerable.  There is room to increase dose of gabapentin, but he wishes to not make any changes at this time. 1.  Continue gabapentin 300mg  TID.  3 month prescription refilled, with 3 refills. 2.  Continue Cymbalta 60mg  TID.  3 month prescription refilled, with 3 refills. 3.  We will also try to obtain notes from [facility], such as EMG results. 4.  Follow up in 6 months or as needed.  Thank you for allowing me to take part in the care of this patient.  Shon Millet, DO  CC:  Rosalyn Gess. Norins, MD

## 2013-02-15 NOTE — Patient Instructions (Addendum)
1.  Continue gabapentin 300mg  three times daily.  We have plenty of room to go up on the dose, so call if you would like to try this. 2.  Continue Cymbalta 60mg  three times daily. 3.  Follow up in 6 months

## 2013-03-06 ENCOUNTER — Ambulatory Visit: Payer: Medicare Other

## 2013-03-06 DIAGNOSIS — Z23 Encounter for immunization: Secondary | ICD-10-CM

## 2013-04-01 ENCOUNTER — Other Ambulatory Visit: Payer: Self-pay | Admitting: Internal Medicine

## 2013-04-30 DIAGNOSIS — N401 Enlarged prostate with lower urinary tract symptoms: Secondary | ICD-10-CM | POA: Diagnosis not present

## 2013-04-30 DIAGNOSIS — R300 Dysuria: Secondary | ICD-10-CM | POA: Diagnosis not present

## 2013-04-30 DIAGNOSIS — N390 Urinary tract infection, site not specified: Secondary | ICD-10-CM | POA: Diagnosis not present

## 2013-05-09 DIAGNOSIS — C61 Malignant neoplasm of prostate: Secondary | ICD-10-CM | POA: Diagnosis not present

## 2013-05-15 DIAGNOSIS — R972 Elevated prostate specific antigen [PSA]: Secondary | ICD-10-CM | POA: Diagnosis not present

## 2013-05-15 DIAGNOSIS — C61 Malignant neoplasm of prostate: Secondary | ICD-10-CM | POA: Diagnosis not present

## 2013-05-15 DIAGNOSIS — R300 Dysuria: Secondary | ICD-10-CM | POA: Diagnosis not present

## 2013-07-08 DIAGNOSIS — C61 Malignant neoplasm of prostate: Secondary | ICD-10-CM | POA: Diagnosis not present

## 2013-07-08 DIAGNOSIS — E291 Testicular hypofunction: Secondary | ICD-10-CM | POA: Diagnosis not present

## 2013-07-09 ENCOUNTER — Ambulatory Visit (INDEPENDENT_AMBULATORY_CARE_PROVIDER_SITE_OTHER): Payer: Medicare Other | Admitting: Internal Medicine

## 2013-07-09 ENCOUNTER — Encounter: Payer: Self-pay | Admitting: Internal Medicine

## 2013-07-09 VITALS — BP 146/70 | Temp 97.1°F | Wt 227.0 lb

## 2013-07-09 DIAGNOSIS — E875 Hyperkalemia: Secondary | ICD-10-CM

## 2013-07-09 DIAGNOSIS — I10 Essential (primary) hypertension: Secondary | ICD-10-CM

## 2013-07-09 DIAGNOSIS — Z23 Encounter for immunization: Secondary | ICD-10-CM

## 2013-07-09 DIAGNOSIS — E785 Hyperlipidemia, unspecified: Secondary | ICD-10-CM | POA: Diagnosis not present

## 2013-07-09 NOTE — Progress Notes (Signed)
Pre visit review using our clinic review tool, if applicable. No additional management support is needed unless otherwise documented below in the visit note. 

## 2013-07-09 NOTE — Progress Notes (Signed)
Subjective:    Patient ID: Jonathan Medina, male    DOB: 02/28/1930, 80 y.o.   MRN: [id number]  HPI Jonathan Medina presents for completion of VA forms for disability in regard to service associated hypertension.  He is due for routine lab follow up.  He did have colonoscopy with Dr. Earlean Shawl in Nov '10 and is scheduling follow up study.   Past Medical History  Diagnosis Date  . Hyperlipemia   . Hypertension   . Gout   . Pulmonary nodule   . Multiple thyroid nodules   . History of colonic polyps   . Prostate cancer 01/09/13    gleason 7, vol 68 cc  . COPD (chronic obstructive pulmonary disease)   . Idiopathic peripheral neuropathy   . BPH (benign prostatic hypertrophy) with urinary obstruction   . Organic impotence   . Hypogonadism male   . Lung cancer 2009    surgery   Past Surgical History  Procedure Laterality Date  . Minithoracotomy    . Excision of spermatocele    . Prostate biopsy  12/14/09, 01/09/13  . Forearm surgery      nonunion repair, bilateral   Family History  Problem Relation Age of Onset  . Prostate cancer Father 44  . Bone cancer Mother   . Cancer Sister     breast in 1 sister  . Cancer Brother     throat   History   Social History  . Marital Status: Married    Spouse Name: N/A    Number of Children: 4  . Years of Education: 15   Occupational History  . Retired   .     Social History Main Topics  . Smoking status: Former Smoker -- 1.00 packs/day for 61 years    Quit date: 07/04/1998  . Smokeless tobacco: Never Used  . Alcohol Use: No     Comment: none since 2000  . Drug Use: No  . Sexual Activity: Not on file   Other Topics Concern  . Not on file   Social History Narrative   [employer]  all but finished engineering. [employer] - [place]. Married '60 2 sons - '61,'61; 2 daughters - '59, '63; 3 grandchildren. 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 ACP - discussed with patient and provided HCPOA and Living Will (July '14).      Current Outpatient Prescriptions on File Prior to Visit  Medication Sig Dispense Refill  . amLODipine (NORVASC) 10 MG tablet TAKE 1 TABLET DAILY  90 tablet  3  . aspirin 81 MG tablet Take 81 mg by mouth daily.        . calcium citrate-vitamin D (CITRACAL+D) 315-200 MG-UNIT per tablet Take 1 tablet by mouth daily.        Marland Kitchen COREG CR 20 MG 24 hr capsule TAKE 1 CAPSULE DAILY  90 capsule  3  . DULoxetine (CYMBALTA) 60 MG capsule Take 1 capsule (60 mg total) by mouth 3 (three) times daily.  270 capsule  3  . gabapentin (NEURONTIN) 300 MG capsule Take 1 capsule (300 mg total) by mouth 3 (three) times daily.  270 capsule  3  . losartan (COZAAR) 100 MG tablet Take 1 tablet (100 mg total) by mouth daily.  90 tablet  3  . Tamsulosin HCl (FLOMAX) 0.4 MG CAPS Take 0.4 mg by mouth daily.        . colchicine 0.6 MG tablet Take 1 tablet (0.6 mg total) by mouth 3 (  three) times daily as needed (gout).  12 tablet  0   No current facility-administered medications on file prior to visit.      Review of Systems System review is negative for any constitutional, cardiac, pulmonary, GI or neuro symptoms or complaints other than as described in the HPI.     Objective:   Physical Exam Filed Vitals:   07/09/13 1442  BP: 146/70  Temp: 97.1 F (36.2 C)   BP Readings from Last 3 Encounters:  07/09/13 146/70  02/15/13 130/70  02/14/13 144/76   Gen'l- WNWD young looking 80 y./o Cor - 2+ radial and RRR PUlm - normal respirations Neuro - A&O x 3, normal get up and go, normal gait.       Assessment & Plan:  [place] visit for completion of VA disability forms

## 2013-07-09 NOTE — Patient Instructions (Signed)
Happy new year.  Complete VA forms - let me know if there is any additional information .  Blood pressure - will check routine lab - you can come in any day.  Immunizations - due for Prevnar today.

## 2013-07-10 ENCOUNTER — Telehealth: Payer: Self-pay | Admitting: Internal Medicine

## 2013-07-10 NOTE — Assessment & Plan Note (Signed)
BP Readings from Last 3 Encounters:  07/09/13 146/70  02/15/13 130/70  02/14/13 144/76   Stable and well controlled.

## 2013-07-10 NOTE — Telephone Encounter (Signed)
Relevant patient education assigned to patient using Emmi. ° °

## 2013-07-11 ENCOUNTER — Other Ambulatory Visit (INDEPENDENT_AMBULATORY_CARE_PROVIDER_SITE_OTHER): Payer: Medicare Other

## 2013-07-11 DIAGNOSIS — E785 Hyperlipidemia, unspecified: Secondary | ICD-10-CM

## 2013-07-11 DIAGNOSIS — E875 Hyperkalemia: Secondary | ICD-10-CM

## 2013-07-11 DIAGNOSIS — I10 Essential (primary) hypertension: Secondary | ICD-10-CM

## 2013-07-11 LAB — BASIC METABOLIC PANEL
BUN: 17 mg/dL (ref 6–23)
CALCIUM: 9.2 mg/dL (ref 8.4–10.5)
CO2: 29 mEq/L (ref 19–32)
Chloride: 105 mEq/L (ref 96–112)
Creatinine, Ser: 1.1 mg/dL (ref 0.4–1.5)
GFR: 79.17 mL/min (ref >60.00)
Glucose, Bld: 94 mg/dL (ref 70–99)
Potassium: 4.5 mEq/L (ref 3.5–5.1)
Sodium: 141 mEq/L (ref 135–145)

## 2013-07-11 LAB — LIPID PANEL
Cholesterol: 224 mg/dL — ABNORMAL HIGH (ref 0–200)
HDL: 43.2 mg/dL (ref >39.00)
Total CHOL/HDL Ratio: 5
Triglycerides: 142 mg/dL (ref 0.0–149.0)
VLDL: 28.4 mg/dL (ref 0.0–40.0)

## 2013-07-11 LAB — LDL CHOLESTEROL, DIRECT: LDL DIRECT: 149.2 mg/dL

## 2013-07-15 DIAGNOSIS — N419 Inflammatory disease of prostate, unspecified: Secondary | ICD-10-CM | POA: Diagnosis not present

## 2013-07-15 DIAGNOSIS — R972 Elevated prostate specific antigen [PSA]: Secondary | ICD-10-CM | POA: Diagnosis not present

## 2013-08-07 ENCOUNTER — Encounter: Payer: Self-pay | Admitting: *Deleted

## 2013-08-07 ENCOUNTER — Encounter: Payer: Self-pay | Admitting: Pulmonary Disease

## 2013-08-07 ENCOUNTER — Ambulatory Visit (INDEPENDENT_AMBULATORY_CARE_PROVIDER_SITE_OTHER): Payer: Medicare Other | Admitting: Pulmonary Disease

## 2013-08-07 VITALS — BP 122/74 | HR 67 | Temp 98.0°F | Wt 229.0 lb

## 2013-08-07 DIAGNOSIS — G4730 Sleep apnea, unspecified: Secondary | ICD-10-CM

## 2013-08-07 NOTE — Patient Instructions (Signed)
VA questionairre filled out We will look at download & give you feedback

## 2013-08-07 NOTE — Progress Notes (Signed)
   Subjective:    Patient ID: Jonathan Medina, male    DOB: 10/08/1929, 80 y.o.   MRN: [id number]  HPI  83/M, [place] veteran, ex smoker for FU of obstructive sleep apnea & chronic insomnia/ shift work disorder  He has PTSD & takes cymbalta daytime for many years. he also has long standing insomnia of sleep initiation & maintenance, since his youth. 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  Nocturnal PSg in dec'11 showed poor sleep efficiency with TST of 197 mins, AHI 12/h , desatn < 88% x 26 mins & lowest d esaturation to 76%. BMI was 35  Download 2/8-09/07/10 >> 5 h compliance, 10 cm OK  New face mask helped,  Download 3/1-3/30/12 >> 4.5 h, no events  Download 9/13 on 10 cm shows good usage avg 5h, few residual events AHI 5/h, some leak    08/07/2013  80m FU   Chief Complaint  Patient presents with  . Follow-up    Pt reports he wears his CPAP everynight x up to 6 hrs a night. He isn;t really feeling rested during the day, waking up at least 3-4 times a night.    Pt states he wears his CPAP everynight 5-6 hrs a night. Pt gets his mask changed often so no problems/ no problems with machine (only when there is bad weather).  Wifereports - nightmares have stopped, no snoring.  Feels rested, no leak, pressure ok.  Changed to medium pillows  Takes cymbalta & neurontin at night   He is applying for disability with the VA on the basis of a connection between PTSD & OSA & wants me to fill out paperwork   Review of Systems neg for any significant sore throat, dysphagia, itching, sneezing, nasal congestion or excess/ purulent secretions, fever, chills, sweats, unintended wt loss, pleuritic or exertional cp, hempoptysis, orthopnea pnd or change in chronic leg swelling. Also denies presyncope, palpitations, heartburn, abdominal pain, nausea, vomiting, diarrhea or change in bowel or urinary habits, dysuria,hematuria, rash,  arthralgias, visual complaints, headache, numbness weakness or ataxia.     Objective:   Physical Exam  Gen. Pleasant, obese, in no distress ENT - no lesions, no post nasal drip Neck: No JVD, no thyromegaly, no carotid bruits Lungs: no use of accessory muscles, no dullness to percussion, decreased without rales or rhonchi  Cardiovascular: Rhythm regular, heart sounds  normal, no murmurs or gallops, no peripheral edema Musculoskeletal: No deformities, no cyanosis or clubbing , no tremors        Assessment & Plan:

## 2013-08-07 NOTE — Assessment & Plan Note (Signed)
VA disability questionairre filled out from [employer] standpoint We will look at download & give you feedback  Weight loss encouraged, compliance with goal of at least 4-6 hrs every night is the expectation. Advised against medications with sedative side effects Cautioned against driving when sleepy - understanding that sleepiness will vary on a day to day basis

## 2013-08-12 DIAGNOSIS — C61 Malignant neoplasm of prostate: Secondary | ICD-10-CM | POA: Diagnosis not present

## 2013-08-12 NOTE — Telephone Encounter (Signed)
error 

## 2013-08-19 ENCOUNTER — Ambulatory Visit (INDEPENDENT_AMBULATORY_CARE_PROVIDER_SITE_OTHER): Payer: Medicare Other | Admitting: Neurology

## 2013-08-19 ENCOUNTER — Encounter: Payer: Self-pay | Admitting: Neurology

## 2013-08-19 VITALS — BP 130/70 | HR 68 | Temp 97.0°F | Resp 16 | Ht 68.0 in | Wt 233.1 lb

## 2013-08-19 DIAGNOSIS — G609 Hereditary and idiopathic neuropathy, unspecified: Secondary | ICD-10-CM | POA: Diagnosis not present

## 2013-08-19 MED ORDER — DULOXETINE HCL 60 MG PO CPEP
60.0000 mg | ORAL_CAPSULE | Freq: Three times a day (TID) | ORAL | Status: DC
Start: 2013-08-19 — End: 2014-03-14

## 2013-08-19 MED ORDER — GABAPENTIN 300 MG PO CAPS
300.0000 mg | ORAL_CAPSULE | Freq: Three times a day (TID) | ORAL | Status: DC
Start: 2013-08-19 — End: 2014-03-14

## 2013-08-19 NOTE — Progress Notes (Signed)
NEUROLOGY FOLLOW UP OFFICE NOTE  Jonathan Medina [id number]  HISTORY OF PRESENT ILLNESS: Jonathan Medina is an 80 year old male with history of HTN, BPH, and hyperlipidemia, who follows up for painful idiopathic peripheral neuropathy.  He is accompanied by his wife.  Records and images were personally reviewed where available.    UPDATE: Stable.  Doing well with gabapentin and Cymbalta.  Water therapy is particularly helpful in controlling the pain and allowing him to function.  HISTORY: He was previously followed at [facility].  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  He had been treated symptomatically for the pain.   He has experienced symptoms since his discharge from the service in 1954.  He describes a tingling, burning and sharp pain, as well as a severe aching pain predominantly in the thighs and hips, but also radiating up from the feet.  It wakes him up at night and can occur with all activities like standing and walking, as well as laying down and sitting.  He denies neck or back pain.  He notes weakness in the legs and has dragged his feet for years.  He ambulates with a cane or walker.  He has a couple of falls in the past, the last one a couple of years ago.  He denies numbness, however.  Symptoms seem to be worse during colder months.  He is sensitive to the cold and exhibits symptoms of Raynaud's, with discoloration of fingers in the cold.  For several years, he has been on gabapentin and Cymbalta.  Currently, he takes gabapentin 300mg  TID and Cymbalta 60mg  TID.  Labs: TSH 1.44, SPEP & IFE revealed nonspecific pattern but no M-spike, glucose 99.  B12 has been normal in the past.  He has a history of left 6th nerve palsy, which lasted 3 years and spontaneously resolved.  It came on suddenly.  He reportedly had MRI of brain, which was unremarkable and etiology was  unknown.  PAST MEDICAL HISTORY: Past Medical History  Diagnosis Date  . Hyperlipemia   . Hypertension   . Gout   . Pulmonary nodule   . Multiple thyroid nodules   . History of colonic polyps   . Prostate cancer 01/09/13    gleason 7, vol 68 cc  . COPD (chronic obstructive pulmonary disease)   . Idiopathic peripheral neuropathy   . BPH (benign prostatic hypertrophy) with urinary obstruction   . Organic impotence   . Hypogonadism male   . Lung cancer 2009    surgery    MEDICATIONS: Current Outpatient Prescriptions on File Prior to Visit  Medication Sig Dispense Refill  . amLODipine (NORVASC) 10 MG tablet TAKE 1 TABLET DAILY  90 tablet  3  . aspirin 81 MG tablet Take 81 mg by mouth daily.        . calcium citrate-vitamin D (CITRACAL+D) 315-200 MG-UNIT per tablet Take 1 tablet by mouth daily.        Marland Kitchen COREG CR 20 MG 24 hr capsule TAKE 1 CAPSULE DAILY  90 capsule  3  . losartan (COZAAR) 100 MG tablet Take 1 tablet (100 mg total) by mouth daily.  90 tablet  3  . Tamsulosin HCl (FLOMAX) 0.4 MG CAPS Take 0.4 mg by mouth daily.        . colchicine 0.6 MG tablet Take 1 tablet (0.6 mg total) by mouth 3 (three) times  daily as needed (gout).  12 tablet  0   No current facility-administered medications on file prior to visit.    ALLERGIES: Allergies  Allergen Reactions  . Iodine     REACTION: tongue swelling, sweat  . Nisoldipine     REACTION: Dizzy  . Shellfish Allergy   . Simvastatin     REACTION: diarrhea, headaches    FAMILY HISTORY: Family History  Problem Relation Age of Onset  . Prostate cancer Father 65  . Bone cancer Mother   . Cancer Sister     breast in 1 sister  . Cancer Brother     throat    SOCIAL HISTORY: History   Social History  . Marital Status: Married    Spouse Name: N/A    Number of Children: 4  . Years of Education: 15   Occupational History  . Retired   .     Social History Main Topics  . Smoking status: Former Smoker -- 1.00 packs/day  for 48 years    Quit date: 07/04/1998  . Smokeless tobacco: Never Used  . Alcohol Use: No     Comment: none since 2000  . Drug Use: No  . Sexual Activity: Not on file   Other Topics Concern  . Not on file   Social History Narrative   [employer]  all but finished engineering. [employer] - [place]. Married '60 2 sons - '61,'61; 2 daughters - '59, '63; 3 grandchildren. 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 ACP - discussed with patient and provided HCPOA and Living Will (July '14).    REVIEW OF SYSTEMS: Constitutional: No fevers, chills, or sweats, no generalized fatigue, change in appetite Eyes: No visual changes, double vision, eye pain Ear, nose and throat: No hearing loss, ear pain, nasal congestion, sore throat Cardiovascular: No chest pain, palpitations Respiratory:  No shortness of breath at rest or with exertion, wheezes GastrointestinaI: No nausea, vomiting, diarrhea, abdominal pain, fecal incontinence Genitourinary:  No dysuria, urinary retention or frequency Musculoskeletal:  No neck pain, back pain Integumentary: No rash, pruritus, skin lesions Neurological: as above Psychiatric: No depression, insomnia, anxiety Endocrine: No palpitations, fatigue, diaphoresis, mood swings, change in appetite, change in weight, increased thirst Hematologic/Lymphatic:  No anemia, purpura, petechiae. Allergic/Immunologic: no itchy/runny eyes, nasal congestion, recent allergic reactions, rashes  PHYSICAL EXAM: Filed Vitals:   08/19/13 1203  BP: 130/70  Pulse: 68  Temp: 97 F (36.1 C)  Resp: 16   General: No acute distress Head:  Normocephalic/atraumatic Neck: supple, no paraspinal tenderness, full range of motion Heart:  Regular rate and rhythm Lungs:  Clear to auscultation bilaterally Back: No paraspinal tenderness Neurological Exam: alert and oriented to person, place, and time. Speech fluent and not dysarthric, language intact.  CN II-XII intact. Fundoscopic  exam unremarkable, no papilledema.  Bulk and tone normal, muscle strength 5/5 throughout.  Sensation to light touch, temperature and vibration intact.  Deep tendon reflexes areflexive throughout, toes downgoing.  Finger to nose testing intact.  Ambulates with cane.  No shuffling.  IMPRESSION: Idiopathic peripheral neuropathy, stable  PLAN: 1.  Refilled gabapentin 300mg  TID and Cymbalta 60mg  TID for 90 day supply with 3 refills. 2.  Follow up in 6 months.  Metta Clines, DO  CC:  Adella Hare, MD

## 2013-08-19 NOTE — Patient Instructions (Signed)
Refilled gabapentin and Cymbalta, 90 day supply with 3 refills.  Follow up in 6 months.

## 2013-08-21 ENCOUNTER — Telehealth: Payer: Self-pay

## 2013-08-21 NOTE — Telephone Encounter (Signed)
Patient requested his labs be faxed to Dr Delorise Shiner at the [facility] [phone]. This was done.

## 2013-08-26 ENCOUNTER — Encounter: Payer: Self-pay | Admitting: Neurology

## 2013-08-26 ENCOUNTER — Ambulatory Visit (INDEPENDENT_AMBULATORY_CARE_PROVIDER_SITE_OTHER): Payer: Medicare Other | Admitting: Neurology

## 2013-08-26 VITALS — BP 126/74 | HR 80 | Temp 98.3°F | Resp 16 | Ht 68.0 in | Wt 228.5 lb

## 2013-08-26 DIAGNOSIS — G609 Hereditary and idiopathic neuropathy, unspecified: Secondary | ICD-10-CM | POA: Diagnosis not present

## 2013-08-27 ENCOUNTER — Encounter: Payer: Self-pay | Admitting: Neurology

## 2013-08-27 NOTE — Progress Notes (Signed)
NEUROLOGY FOLLOW UP OFFICE NOTE  Jonathan Medina [id number]  HISTORY OF PRESENT ILLNESS: Jonathan Medina is an 80 year old male with history of HTN, BPH, and hyperlipidemia, who follows up regarding filling out [facility] forms pertaining to his painful idiopathic peripheral neuropathy. He is accompanied by his wife. Records and images were personally reviewed where available.   UPDATE:  Presents to give me forms to fill out pertaining to his neuropathy.  HISTORY:  He was previously followed at [facility]. 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 He had been treated symptomatically for the pain.  He has experienced symptoms since his discharge from the service in 1954. He describes a tingling, burning and sharp pain, as well as a severe aching pain predominantly in the thighs and hips, but also radiating up from the feet. It wakes him up at night and can occur with all activities like standing and walking, as well as laying down and sitting. He denies neck or back pain. He notes weakness in the legs and has dragged his feet for years. He ambulates with a cane or walker. He has a couple of falls in the past, the last one a couple of years ago. He denies numbness, however. Symptoms seem to be worse during colder months. He is sensitive to the cold and exhibits symptoms of Raynaud's, with discoloration of fingers in the cold.  For several years, he has been on gabapentin and Cymbalta. Currently, he takes gabapentin 300mg  TID and Cymbalta 60mg  TID.  Labs: TSH 1.44, SPEP & IFE revealed nonspecific pattern but no M-spike.  He was evaluated for MGUS.  His glucose 99. B12 has been normal in the past.  He has a history of left 6th nerve palsy, which lasted 3 years and spontaneously resolved. It came on suddenly. He reportedly had MRI of brain, which was unremarkable and etiology was unknown.   PAST  MEDICAL HISTORY: Past Medical History  Diagnosis Date  . Hyperlipemia   . Hypertension   . Gout   . Pulmonary nodule   . Multiple thyroid nodules   . History of colonic polyps   . Prostate cancer 01/09/13    gleason 7, vol 68 cc  . COPD (chronic obstructive pulmonary disease)   . Idiopathic peripheral neuropathy   . BPH (benign prostatic hypertrophy) with urinary obstruction   . Organic impotence   . Hypogonadism male   . Lung cancer 2009    surgery    MEDICATIONS: Current Outpatient Prescriptions on File Prior to Visit  Medication Sig Dispense Refill  . amLODipine (NORVASC) 10 MG tablet TAKE 1 TABLET DAILY  90 tablet  3  . aspirin 81 MG tablet Take 81 mg by mouth daily.        . calcium citrate-vitamin D (CITRACAL+D) 315-200 MG-UNIT per tablet Take 1 tablet by mouth daily.        Marland Kitchen COREG CR 20 MG 24 hr capsule TAKE 1 CAPSULE DAILY  90 capsule  3  . DULoxetine (CYMBALTA) 60 MG capsule Take 1 capsule (60 mg total) by mouth 3 (three) times daily.  270 capsule  3  . gabapentin (NEURONTIN) 300 MG capsule Take 1 capsule (300 mg total) by mouth 3 (three) times daily.  270 capsule  3  . losartan (COZAAR) 100 MG tablet Take 1 tablet (100 mg total) by mouth daily.  90 tablet  3  .  Tamsulosin HCl (FLOMAX) 0.4 MG CAPS Take 0.4 mg by mouth daily.        . colchicine 0.6 MG tablet Take 1 tablet (0.6 mg total) by mouth 3 (three) times daily as needed (gout).  12 tablet  0   No current facility-administered medications on file prior to visit.    ALLERGIES: Allergies  Allergen Reactions  . Iodine     REACTION: tongue swelling, sweat  . Nisoldipine     REACTION: Dizzy  . Shellfish Allergy   . Simvastatin     REACTION: diarrhea, headaches    FAMILY HISTORY: Family History  Problem Relation Age of Onset  . Prostate cancer Father 37  . Bone cancer Mother   . Cancer Sister     breast in 1 sister  . Cancer Brother     throat    SOCIAL HISTORY: History   Social History  .  Marital Status: Married    Spouse Name: N/A    Number of Children: 4  . Years of Education: 15   Occupational History  . Retired   .     Social History Main Topics  . Smoking status: Former Smoker -- 1.00 packs/day for 51 years    Quit date: 07/04/1998  . Smokeless tobacco: Never Used  . Alcohol Use: No     Comment: none since 2000  . Drug Use: No  . Sexual Activity: Not on file   Other Topics Concern  . Not on file   Social History Narrative   [employer]  all but finished engineering. [employer] - [place]. Married '60 2 sons - '61,'61; 2 daughters - '59, '63; 3 grandchildren. 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 ACP - discussed with patient and provided HCPOA and Living Will (July '14).    REVIEW OF SYSTEMS: Constitutional: No fevers, chills, or sweats, no generalized fatigue, change in appetite Eyes: No visual changes, double vision, eye pain Ear, nose and throat: No hearing loss, ear pain, nasal congestion, sore throat Cardiovascular: No chest pain, palpitations Respiratory:  No shortness of breath at rest or with exertion, wheezes GastrointestinaI: No nausea, vomiting, diarrhea, abdominal pain, fecal incontinence Genitourinary:  No dysuria, urinary retention or frequency Musculoskeletal:  No neck pain, back pain Integumentary: No rash, pruritus, skin lesions Neurological: as above Psychiatric: No depression, insomnia, anxiety Endocrine: No palpitations, fatigue, diaphoresis, mood swings, change in appetite, change in weight, increased thirst Hematologic/Lymphatic:  No anemia, purpura, petechiae. Allergic/Immunologic: no itchy/runny eyes, nasal congestion, recent allergic reactions, rashes  PHYSICAL EXAM: Filed Vitals:   08/26/13 1442  BP: 126/74  Pulse: 80  Temp: 98.3 F (36.8 C)  Resp: 16   General: No acute distress Head:  Normocephalic/atraumatic   IMPRESSION: Painful idiopathic peripheral neuropathy.  Very possible that it could  have been attributed to extreme cold conditions and frost bite while serving in the [place] War.  PLAN: Forms filled out and reviewed with patient.  30 minutes spent with patient, 100% spent filling out forms and reviewing with the patient.  Metta Clines, DO

## 2013-10-03 DIAGNOSIS — H1045 Other chronic allergic conjunctivitis: Secondary | ICD-10-CM | POA: Diagnosis not present

## 2013-10-03 DIAGNOSIS — Z91013 Allergy to seafood: Secondary | ICD-10-CM | POA: Diagnosis not present

## 2013-10-03 DIAGNOSIS — J309 Allergic rhinitis, unspecified: Secondary | ICD-10-CM | POA: Diagnosis not present

## 2013-10-30 DIAGNOSIS — L820 Inflamed seborrheic keratosis: Secondary | ICD-10-CM | POA: Diagnosis not present

## 2013-10-30 DIAGNOSIS — L919 Hypertrophic disorder of the skin, unspecified: Secondary | ICD-10-CM | POA: Diagnosis not present

## 2013-10-30 DIAGNOSIS — L909 Atrophic disorder of skin, unspecified: Secondary | ICD-10-CM | POA: Diagnosis not present

## 2013-11-21 DIAGNOSIS — H2510 Age-related nuclear cataract, unspecified eye: Secondary | ICD-10-CM | POA: Diagnosis not present

## 2013-11-21 DIAGNOSIS — H02839 Dermatochalasis of unspecified eye, unspecified eyelid: Secondary | ICD-10-CM | POA: Diagnosis not present

## 2013-11-28 DIAGNOSIS — M25819 Other specified joint disorders, unspecified shoulder: Secondary | ICD-10-CM | POA: Diagnosis not present

## 2013-11-28 DIAGNOSIS — C61 Malignant neoplasm of prostate: Secondary | ICD-10-CM | POA: Diagnosis not present

## 2013-12-02 DIAGNOSIS — N419 Inflammatory disease of prostate, unspecified: Secondary | ICD-10-CM | POA: Diagnosis not present

## 2013-12-02 DIAGNOSIS — C61 Malignant neoplasm of prostate: Secondary | ICD-10-CM | POA: Diagnosis not present

## 2013-12-13 DIAGNOSIS — R82998 Other abnormal findings in urine: Secondary | ICD-10-CM | POA: Diagnosis not present

## 2013-12-13 DIAGNOSIS — R300 Dysuria: Secondary | ICD-10-CM | POA: Diagnosis not present

## 2013-12-23 ENCOUNTER — Other Ambulatory Visit: Payer: Self-pay | Admitting: Orthopedic Surgery

## 2013-12-23 DIAGNOSIS — M25511 Pain in right shoulder: Secondary | ICD-10-CM

## 2013-12-23 DIAGNOSIS — Z139 Encounter for screening, unspecified: Secondary | ICD-10-CM

## 2013-12-23 DIAGNOSIS — M25819 Other specified joint disorders, unspecified shoulder: Secondary | ICD-10-CM | POA: Diagnosis not present

## 2013-12-25 DIAGNOSIS — C61 Malignant neoplasm of prostate: Secondary | ICD-10-CM | POA: Diagnosis not present

## 2013-12-31 ENCOUNTER — Other Ambulatory Visit: Payer: Self-pay | Admitting: Orthopedic Surgery

## 2013-12-31 ENCOUNTER — Ambulatory Visit
Admission: RE | Admit: 2013-12-31 | Discharge: 2013-12-31 | Disposition: A | Discharge status: Home / Self Care | Payer: Medicare Other | Source: Ambulatory Visit | Attending: Orthopedic Surgery | Admitting: Orthopedic Surgery

## 2013-12-31 ENCOUNTER — Ambulatory Visit: Payer: Medicare Other | Admitting: Internal Medicine

## 2013-12-31 DIAGNOSIS — S41009A Unspecified open wound of unspecified shoulder, initial encounter: Secondary | ICD-10-CM | POA: Diagnosis not present

## 2013-12-31 DIAGNOSIS — Z139 Encounter for screening, unspecified: Secondary | ICD-10-CM

## 2013-12-31 DIAGNOSIS — M25511 Pain in right shoulder: Secondary | ICD-10-CM

## 2014-01-01 DIAGNOSIS — R82998 Other abnormal findings in urine: Secondary | ICD-10-CM | POA: Diagnosis not present

## 2014-01-01 DIAGNOSIS — C61 Malignant neoplasm of prostate: Secondary | ICD-10-CM | POA: Diagnosis not present

## 2014-01-01 DIAGNOSIS — R972 Elevated prostate specific antigen [PSA]: Secondary | ICD-10-CM | POA: Diagnosis not present

## 2014-01-06 ENCOUNTER — Ambulatory Visit (INDEPENDENT_AMBULATORY_CARE_PROVIDER_SITE_OTHER): Payer: Medicare Other | Admitting: Internal Medicine

## 2014-01-06 ENCOUNTER — Encounter: Payer: Self-pay | Admitting: Internal Medicine

## 2014-01-06 VITALS — BP 138/84 | HR 75 | Temp 98.6°F | Resp 16 | Ht 68.0 in | Wt 233.0 lb

## 2014-01-06 DIAGNOSIS — M545 Low back pain, unspecified: Secondary | ICD-10-CM

## 2014-01-06 DIAGNOSIS — C61 Malignant neoplasm of prostate: Secondary | ICD-10-CM

## 2014-01-06 DIAGNOSIS — R040 Epistaxis: Secondary | ICD-10-CM | POA: Diagnosis not present

## 2014-01-06 DIAGNOSIS — I10 Essential (primary) hypertension: Secondary | ICD-10-CM

## 2014-01-06 DIAGNOSIS — Z860100 Personal history of colon polyps, unspecified: Secondary | ICD-10-CM

## 2014-01-06 DIAGNOSIS — E785 Hyperlipidemia, unspecified: Secondary | ICD-10-CM

## 2014-01-06 DIAGNOSIS — M109 Gout, unspecified: Secondary | ICD-10-CM

## 2014-01-06 NOTE — Progress Notes (Signed)
Subjective:    Patient ID: Jonathan Medina, male    DOB: [birth year], 81 y.o.   MRN: [id number]  Hypertension This is a chronic problem. The current episode started more than 1 year ago. The problem has been gradually improving since onset. The problem is controlled. Pertinent negatives include no anxiety, blurred vision, chest pain, headaches, malaise/fatigue, neck pain, orthopnea, palpitations, peripheral edema, PND, shortness of breath or sweats. Agents associated with hypertension include NSAIDs. Past treatments include beta blockers, calcium channel blockers and angiotensin blockers. The current treatment provides moderate improvement. Compliance problems include diet and exercise.       Review of Systems  Constitutional: Negative.  Negative for fever, chills, malaise/fatigue, diaphoresis, appetite change and fatigue.  HENT: Positive for nosebleeds (left side). Negative for postnasal drip, rhinorrhea, sinus pressure, sore throat and voice change.   Eyes: Negative.  Negative for blurred vision.  Respiratory: Negative.  Negative for apnea, cough, choking, chest tightness, shortness of breath, wheezing and stridor.   Cardiovascular: Negative.  Negative for chest pain, palpitations, orthopnea, leg swelling and PND.  Gastrointestinal: Negative.  Negative for nausea, vomiting, abdominal pain, diarrhea, constipation and blood in stool.  Endocrine: Negative.   Genitourinary: Negative.   Musculoskeletal: Positive for back pain. Negative for arthralgias, gait problem, joint swelling, myalgias, neck pain and neck stiffness.  Skin: Negative.  Negative for rash.  Allergic/Immunologic: Negative.   Neurological: Negative.  Negative for dizziness, tremors, syncope, facial asymmetry, weakness, light-headedness, numbness and headaches.  Hematological: Negative.  Negative for adenopathy.  Psychiatric/Behavioral: Negative.        Objective:   Physical Exam  Vitals reviewed. Constitutional: He is  oriented to person, place, and time. He appears well-developed and well-nourished. No distress.  HENT:  Head: Normocephalic and atraumatic.  Nose: No mucosal edema, rhinorrhea, nose lacerations, sinus tenderness, nasal deformity, septal deviation or nasal septal hematoma. Epistaxis (there is a small bloody scab in the left nares but no active bleeding) is observed.  No foreign bodies. Right sinus exhibits no maxillary sinus tenderness and no frontal sinus tenderness. Left sinus exhibits no maxillary sinus tenderness and no frontal sinus tenderness.  Mouth/Throat: Oropharynx is clear and moist. No oropharyngeal exudate.  Eyes: Conjunctivae are normal. Right eye exhibits no discharge. Left eye exhibits no discharge. No scleral icterus.  Neck: Normal range of motion. Neck supple. No JVD present. No tracheal deviation present. No thyromegaly present.  Cardiovascular: Normal rate, regular rhythm, normal heart sounds and intact distal pulses.  Exam reveals no gallop and no friction rub.   No murmur heard. Pulmonary/Chest: Effort normal and breath sounds normal. No stridor. No respiratory distress. He has no wheezes. He has no rales. He exhibits no tenderness.  Abdominal: Soft. Bowel sounds are normal. He exhibits no distension and no mass. There is no tenderness. There is no rebound and no guarding.  Musculoskeletal: Normal range of motion. He exhibits no edema and no tenderness.  Lymphadenopathy:    He has no cervical adenopathy.  Neurological: He is oriented to person, place, and time.  Skin: Skin is warm and dry. No rash noted. He is not diaphoretic. No erythema. No pallor.     Lab Results  Component Value Date   WBC 7.0 06/21/2012   HGB 13.9 06/21/2012   HCT 41.6 06/21/2012   PLT 289.0 06/21/2012   GLUCOSE 94 07/11/2013   CHOL 224* 07/11/2013   TRIG 142.0 07/11/2013   HDL 43.20 07/11/2013   LDLDIRECT 149.2 07/11/2013   LDLCALC 119* 05/17/2010  ALT 14 01/03/2013   ALT 14 01/03/2013   AST 18 01/03/2013    AST 18 01/03/2013   NA 141 07/11/2013   K 4.5 07/11/2013   CL 105 07/11/2013   CREATININE 1.1 07/11/2013   BUN 17 07/11/2013   CO2 29 07/11/2013   TSH 1.44 06/21/2012   PSA 3.16 06/24/2008   INR 0.9 08/07/2007   HGBA1C 6.2 10/09/2008       Assessment & Plan:

## 2014-01-06 NOTE — Progress Notes (Signed)
Pre visit review using our clinic review tool, if applicable. No additional management support is needed unless otherwise documented below in the visit note. 

## 2014-01-06 NOTE — Patient Instructions (Signed)
Hypertension Hypertension, commonly called high blood pressure, is when the force of blood pumping through your arteries is too strong. Your arteries are the blood vessels that carry blood from your heart throughout your body. A blood pressure reading consists of a higher number over a lower number, such as 110/72. The higher number (systolic) is the pressure inside your arteries when your heart pumps. The lower number (diastolic) is the pressure inside your arteries when your heart relaxes. Ideally you want your blood pressure below 120/80. Hypertension forces your heart to work harder to pump blood. Your arteries may become narrow or stiff. Having hypertension puts you at risk for heart disease, stroke, and other problems.  RISK FACTORS Some risk factors for high blood pressure are controllable. Others are not.  Risk factors you cannot control include:   Race. You may be at higher risk if you are African American.  Age. Risk increases with age.  Gender. Men are at higher risk than women before age 45 years. After age 65, women are at higher risk than men. Risk factors you can control include:  Not getting enough exercise or physical activity.  Being overweight.  Getting too much fat, sugar, calories, or salt in your diet.  Drinking too much alcohol. SIGNS AND SYMPTOMS Hypertension does not usually cause signs or symptoms. Extremely high blood pressure (hypertensive crisis) may cause headache, anxiety, shortness of breath, and nosebleed. DIAGNOSIS  To check if you have hypertension, your health care provider will measure your blood pressure while you are seated, with your arm held at the level of your heart. It should be measured at least twice using the same arm. Certain conditions can cause a difference in blood pressure between your right and left arms. A blood pressure reading that is higher than normal on one occasion does not mean that you need treatment. If one blood pressure reading  is high, ask your health care provider about having it checked again. TREATMENT  Treating high blood pressure includes making lifestyle changes and possibly taking medication. Living a healthy lifestyle can help lower high blood pressure. You may need to change some of your habits. Lifestyle changes may include:  Following the DASH diet. This diet is high in fruits, vegetables, and whole grains. It is low in salt, red meat, and added sugars.  Getting at least 2 1/2 hours of brisk physical activity every week.  Losing weight if necessary.  Not smoking.  Limiting alcoholic beverages.  Learning ways to reduce stress. If lifestyle changes are not enough to get your blood pressure under control, your health care provider may prescribe medicine. You may need to take more than one. Work closely with your health care provider to understand the risks and benefits. HOME CARE INSTRUCTIONS  Have your blood pressure rechecked as directed by your health care provider.   Only take medicine as directed by your health care provider. Follow the directions carefully. Blood pressure medicines must be taken as prescribed. The medicine does not work as well when you skip doses. Skipping doses also puts you at risk for problems.   Do not smoke.   Monitor your blood pressure at home as directed by your health care provider. SEEK MEDICAL CARE IF:   You think you are having a reaction to medicines taken.  You have recurrent headaches or feel dizzy.  You have swelling in your ankles.  You have trouble with your vision. SEEK IMMEDIATE MEDICAL CARE IF:  You develop a severe headache or   confusion.  You have unusual weakness, numbness, or feel faint.  You have severe chest or abdominal pain.  You vomit repeatedly.  You have trouble breathing. MAKE SURE YOU:   Understand these instructions.  Will watch your condition.  Will get help right away if you are not doing well or get  worse. Document Released: 06/20/2005 Document Revised: 06/25/2013 Document Reviewed: 04/12/2013 ExitCare Patient Information 2015 [employer]. This information is not intended to replace advice given to you by your health care provider. Make sure you discuss any questions you have with your health care provider.  

## 2014-01-07 NOTE — Assessment & Plan Note (Signed)
His BP is well controlled 

## 2014-01-07 NOTE — Assessment & Plan Note (Signed)
ENT referral at his request

## 2014-01-14 DIAGNOSIS — R040 Epistaxis: Secondary | ICD-10-CM | POA: Diagnosis not present

## 2014-01-14 DIAGNOSIS — I10 Essential (primary) hypertension: Secondary | ICD-10-CM | POA: Diagnosis not present

## 2014-01-20 ENCOUNTER — Other Ambulatory Visit: Payer: Self-pay

## 2014-01-20 MED ORDER — LOSARTAN POTASSIUM 100 MG PO TABS: 100.0000 mg | ORAL_TABLET | Freq: Every day | ORAL | Status: DC

## 2014-01-27 ENCOUNTER — Other Ambulatory Visit: Payer: Self-pay | Admitting: *Deleted

## 2014-01-27 MED ORDER — LOSARTAN POTASSIUM 100 MG PO TABS: 100.0000 mg | ORAL_TABLET | Freq: Every day | ORAL | Status: DC

## 2014-01-27 NOTE — Telephone Encounter (Signed)
Left msg on triage needing updated script on his losartan sent to [facility]. Called pt no answer LMOM med sent to [facility]...Jonathan Medina

## 2014-01-28 DIAGNOSIS — R300 Dysuria: Secondary | ICD-10-CM | POA: Diagnosis not present

## 2014-01-28 DIAGNOSIS — C61 Malignant neoplasm of prostate: Secondary | ICD-10-CM | POA: Diagnosis not present

## 2014-01-28 DIAGNOSIS — N419 Inflammatory disease of prostate, unspecified: Secondary | ICD-10-CM | POA: Diagnosis not present

## 2014-02-17 ENCOUNTER — Ambulatory Visit (INDEPENDENT_AMBULATORY_CARE_PROVIDER_SITE_OTHER): Payer: Medicare Other | Admitting: Neurology

## 2014-02-17 ENCOUNTER — Encounter: Payer: Self-pay | Admitting: Neurology

## 2014-02-17 ENCOUNTER — Telehealth: Payer: Self-pay | Admitting: Internal Medicine

## 2014-02-17 VITALS — BP 110/64 | HR 80 | Resp 25 | Ht 68.0 in | Wt 235.0 lb

## 2014-02-17 DIAGNOSIS — G609 Hereditary and idiopathic neuropathy, unspecified: Secondary | ICD-10-CM

## 2014-02-17 NOTE — Progress Notes (Signed)
NEUROLOGY FOLLOW UP OFFICE NOTE  Jonathan Medina [id number]  HISTORY OF PRESENT ILLNESS: Jonathan Medina is an 80 year old male with history of HTN, BPH, and hyperlipidemia, who follows up for painful idiopathic peripheral neuropathy.  He is accompanied by his wife.  Records and images were personally reviewed where available.    UPDATE: Currently taking gabapentin 300mg  three times daily and Cymbalta 60mg  three times daily (mostly for depression).  Since last visit, he was diagnosed with prostate cancer.  Further testing is in progress.  He also injured his right rotator cuff.  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  He had been treated symptomatically for the pain.   He has experienced symptoms since his discharge from the service in 1954.  He describes a tingling, burning and sharp pain, as well as a severe aching pain predominantly in the thighs and hips, but also radiating up from the feet.  It wakes him up at night and can occur with all activities like standing and walking, as well as laying down and sitting.  He denies neck or back pain.  He notes weakness in the legs and has dragged his feet for years.  He ambulates with a cane or walker.  He has a couple of falls in the past, the last one a couple of years ago.  He denies numbness, however.  Symptoms seem to be worse during colder months.  He is sensitive to the cold and exhibits symptoms of Raynaud's, with discoloration of fingers in the cold.  For several years, he has been on gabapentin and Cymbalta.  Currently, he takes gabapentin 300mg  TID and Cymbalta 60mg  TID.  Labs: TSH 1.44, SPEP & IFE revealed nonspecific pattern but no M-spike, glucose 99.  B12 has been normal in the past.  He has a history of left 6th nerve palsy, which lasted 3 years and spontaneously resolved.  It came on suddenly.  He reportedly had MRI of  brain, which was unremarkable and etiology was unknown.  PAST MEDICAL HISTORY: Past Medical History  Diagnosis Date  . Hyperlipemia   . Hypertension   . Gout   . Pulmonary nodule   . Multiple thyroid nodules   . History of colonic polyps   . Prostate cancer 01/09/13    gleason 7, vol 68 cc  . COPD (chronic obstructive pulmonary disease)   . Idiopathic peripheral neuropathy   . BPH (benign prostatic hypertrophy) with urinary obstruction   . Organic impotence   . Hypogonadism male   . Lung cancer 2009    surgery    MEDICATIONS: Current Outpatient Prescriptions on File Prior to Visit  Medication Sig Dispense Refill  . amLODipine (NORVASC) 10 MG tablet TAKE 1 TABLET DAILY  90 tablet  3  . aspirin 81 MG tablet Take 81 mg by mouth daily.        . calcium citrate-vitamin D (CITRACAL+D) 315-200 MG-UNIT per tablet Take 1 tablet by mouth daily.        Marland Kitchen COREG CR 20 MG 24 hr capsule TAKE 1 CAPSULE DAILY  90 capsule  3  . DULoxetine (CYMBALTA) 60 MG capsule Take 1 capsule (60 mg total) by mouth 3 (three) times daily.  270 capsule  3  . gabapentin (NEURONTIN) 300 MG capsule Take 1 capsule (300 mg total) by mouth 3 (three) times daily.  270 capsule  3  .  losartan (COZAAR) 100 MG tablet Take 1 tablet (100 mg total) by mouth daily.  90 tablet  3  . Tamsulosin HCl (FLOMAX) 0.4 MG CAPS Take 0.4 mg by mouth daily.         No current facility-administered medications on file prior to visit.    ALLERGIES: Allergies  Allergen Reactions  . Iodine     REACTION: tongue swelling, sweat  . Nisoldipine     REACTION: Dizzy  . Shellfish Allergy   . Simvastatin     REACTION: diarrhea, headaches    FAMILY HISTORY: Family History  Problem Relation Age of Onset  . Prostate cancer Father 20  . Bone cancer Mother   . Cancer Sister     breast in 1 sister  . Cancer Brother     throat    SOCIAL HISTORY: History   Social History  . Marital Status: Married    Spouse Name: N/A    Number of  Children: 4  . Years of Education: 15   Occupational History  . Retired   .     Social History Main Topics  . Smoking status: Former Smoker -- 1.00 packs/day for 51 years    Quit date: 07/04/1998  . Smokeless tobacco: Never Used  . Alcohol Use: No     Comment: none since 2000  . Drug Use: No  . Sexual Activity: Not Currently   Other Topics Concern  . Not on file   Social History Narrative   [employer]  all but finished engineering. [employer] - [place]. Married '60 2 sons - '61,'61; 2 daughters - '59, '63; 3 grandchildren. 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 ACP - discussed with patient and provided HCPOA and Living Will (July '14).    REVIEW OF SYSTEMS: Constitutional: No fevers, chills, or sweats, no generalized fatigue, change in appetite Eyes: No visual changes, double vision, eye pain Ear, nose and throat: No hearing loss, ear pain, nasal congestion, sore throat Cardiovascular: No chest pain, palpitations Respiratory:  No shortness of breath at rest or with exertion, wheezes GastrointestinaI: No nausea, vomiting, diarrhea, abdominal pain, fecal incontinence Genitourinary:  No dysuria, urinary retention or frequency Musculoskeletal:  No neck pain, back pain Integumentary: No rash, pruritus, skin lesions Neurological: as above Psychiatric: No depression, insomnia, anxiety Endocrine: No palpitations, fatigue, diaphoresis, mood swings, change in appetite, change in weight, increased thirst Hematologic/Lymphatic:  No anemia, purpura, petechiae. Allergic/Immunologic: no itchy/runny eyes, nasal congestion, recent allergic reactions, rashes  PHYSICAL EXAM: Filed Vitals:   02/17/14 1325  BP: 110/64  Pulse: 80  Resp: 25   General: No acute distress Head:  Normocephalic/atraumatic Neck: supple, no paraspinal tenderness, full range of motion Heart:  Regular rate and rhythm Lungs:  Clear to auscultation bilaterally Back: No paraspinal  tenderness Neurological Exam: alert and oriented to person, place, and time. Speech fluent and not dysarthric, language intact. CN II-XII intact. Fundoscopic exam unremarkable, no papilledema. Bulk and tone normal, muscle strength 5/5 throughout. Sensation to light touch, temperature and vibration intact. Deep tendon reflexes areflexive throughout, toes downgoing. Finger to nose testing intact. Ambulates with cane. No shuffling.   IMPRESSION: Painful idiopathic peripheral neuropathy. Possible that it could have been attributed to extreme cold conditions and frost bite while serving in the [place] War.  PLAN: Gabapentin 300mg  three times daily Cymbalta 60mg  three times daily Follow up in one year  Metta Clines, DO  CC:  [name], MD

## 2014-02-17 NOTE — Patient Instructions (Signed)
Continue gabapentin 300mg  three times daily Follow up in one year

## 2014-02-17 NOTE — Telephone Encounter (Signed)
Received records from [facility] [place] 5 pages to Dr.Jones,Thomas

## 2014-02-17 NOTE — Progress Notes (Deleted)
NEUROLOGY FOLLOW UP OFFICE NOTE  Jonathan Medina [id number]  HISTORY OF PRESENT ILLNESS: ***.  Records and images were personally reviewed where available.  ***.  PAST MEDICAL HISTORY: Past Medical History  Diagnosis Date  . Hyperlipemia   . Hypertension   . Gout   . Pulmonary nodule   . Multiple thyroid nodules   . History of colonic polyps   . Prostate cancer 01/09/13    gleason 7, vol 68 cc  . COPD (chronic obstructive pulmonary disease)   . Idiopathic peripheral neuropathy   . BPH (benign prostatic hypertrophy) with urinary obstruction   . Organic impotence   . Hypogonadism male   . Lung cancer 2009    surgery    MEDICATIONS: Current Outpatient Prescriptions on File Prior to Visit  Medication Sig Dispense Refill  . amLODipine (NORVASC) 10 MG tablet TAKE 1 TABLET DAILY  90 tablet  3  . aspirin 81 MG tablet Take 81 mg by mouth daily.        . calcium citrate-vitamin D (CITRACAL+D) 315-200 MG-UNIT per tablet Take 1 tablet by mouth daily.        Marland Kitchen COREG CR 20 MG 24 hr capsule TAKE 1 CAPSULE DAILY  90 capsule  3  . DULoxetine (CYMBALTA) 60 MG capsule Take 1 capsule (60 mg total) by mouth 3 (three) times daily.  270 capsule  3  . gabapentin (NEURONTIN) 300 MG capsule Take 1 capsule (300 mg total) by mouth 3 (three) times daily.  270 capsule  3  . losartan (COZAAR) 100 MG tablet Take 1 tablet (100 mg total) by mouth daily.  90 tablet  3  . Tamsulosin HCl (FLOMAX) 0.4 MG CAPS Take 0.4 mg by mouth daily.         No current facility-administered medications on file prior to visit.    ALLERGIES: Allergies  Allergen Reactions  . Iodine     REACTION: tongue swelling, sweat  . Nisoldipine     REACTION: Dizzy  . Shellfish Allergy   . Simvastatin     REACTION: diarrhea, headaches    FAMILY HISTORY: Family History  Problem Relation Age of Onset  . Prostate cancer Father 64  . Bone cancer Mother   . Cancer Sister     breast in 1 sister  . Cancer Brother     throat     SOCIAL HISTORY: History   Social History  . Marital Status: Married    Spouse Name: N/A    Number of Children: 4  . Years of Education: 15   Occupational History  . Retired   .     Social History Main Topics  . Smoking status: Former Smoker -- 1.00 packs/day for 47 years    Quit date: 07/04/1998  . Smokeless tobacco: Never Used  . Alcohol Use: No     Comment: none since 2000  . Drug Use: No  . Sexual Activity: Not Currently   Other Topics Concern  . Not on file   Social History Narrative   [employer]  all but finished engineering. [employer] - [place]. Married '60 2 sons - '61,'61; 2 daughters - '59, '63; 3 grandchildren. 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 ACP - discussed with patient and provided HCPOA and Living Will (July '14).    REVIEW OF SYSTEMS: Constitutional: No fevers, chills, or sweats, no generalized fatigue, change in appetite Eyes: No visual changes, double vision, eye pain Ear, nose and throat: No hearing  loss, ear pain, nasal congestion, sore throat Cardiovascular: No chest pain, palpitations Respiratory:  No shortness of breath at rest or with exertion, wheezes GastrointestinaI: No nausea, vomiting, diarrhea, abdominal pain, fecal incontinence Genitourinary:  No dysuria, urinary retention or frequency Musculoskeletal:  No neck pain, back pain Integumentary: No rash, pruritus, skin lesions Neurological: as above Psychiatric: No depression, insomnia, anxiety Endocrine: No palpitations, fatigue, diaphoresis, mood swings, change in appetite, change in weight, increased thirst Hematologic/Lymphatic:  No anemia, purpura, petechiae. Allergic/Immunologic: no itchy/runny eyes, nasal congestion, recent allergic reactions, rashes  PHYSICAL EXAM: Filed Vitals:   02/17/14 1325  BP: 110/64  Pulse: 80  Resp: 25   General: No acute distress Head:  Normocephalic/atraumatic Neck: supple, no paraspinal tenderness, full range of  motion Heart:  Regular rate and rhythm Lungs:  Clear to auscultation bilaterally Back: No paraspinal tenderness Neurological Exam: alert and oriented to person, place, and time. Attention span and concentration intact, recent and remote memory intact, fund of knowledge intact.  Speech fluent and not dysarthric, language intact.  CN II-XII intact. Fundoscopic exam unremarkable without vessel changes, exudates, hemorrhages or papilledema.  Bulk and tone normal, muscle strength 5/5 throughout.  Sensation to light touch, temperature and vibration intact.  Deep tendon reflexes 2+ throughout, toes downgoing.  Finger to nose and heel to shin testing intact.  Gait normal, Romberg negative.  IMPRESSION: ***  PLAN: ***  Metta Clines, DO  CC: ***

## 2014-02-25 DIAGNOSIS — C61 Malignant neoplasm of prostate: Secondary | ICD-10-CM | POA: Diagnosis not present

## 2014-02-27 DIAGNOSIS — L821 Other seborrheic keratosis: Secondary | ICD-10-CM | POA: Diagnosis not present

## 2014-02-27 DIAGNOSIS — L819 Disorder of pigmentation, unspecified: Secondary | ICD-10-CM | POA: Diagnosis not present

## 2014-03-03 ENCOUNTER — Encounter: Payer: Self-pay | Admitting: Podiatry

## 2014-03-03 ENCOUNTER — Ambulatory Visit (INDEPENDENT_AMBULATORY_CARE_PROVIDER_SITE_OTHER): Payer: Medicare Other | Admitting: Podiatry

## 2014-03-03 VITALS — BP 148/77 | HR 77 | Resp 18 | Ht 68.0 in | Wt 235.0 lb

## 2014-03-03 DIAGNOSIS — B351 Tinea unguium: Secondary | ICD-10-CM | POA: Diagnosis not present

## 2014-03-03 NOTE — Progress Notes (Signed)
   Subjective:    Patient ID: Jonathan Medina, male    DOB: [birth year], 80 y.o.   MRN: [id number]  HPI Comments: Little discoloration under the big toenails , about 3 months ago it started just would like him to take a look at them      Review of Systems  All other systems reviewed and are negative.      Objective:   Physical Exam        Assessment & Plan:

## 2014-03-04 DIAGNOSIS — B351 Tinea unguium: Secondary | ICD-10-CM

## 2014-03-04 NOTE — Progress Notes (Signed)
Subjective:     Patient ID: Jonathan Medina, male   DOB: September 24, 1929, 80 y.o.   MRN: [id number]  HPI patient presents stating I have some discoloration on my big toenails of both feet and I was concerned that this might be fungus   Review of Systems  All other systems reviewed and are negative.      Objective:   Physical Exam  Nursing note and vitals reviewed. Constitutional: He is oriented to person, place, and time.  Cardiovascular: Intact distal pulses.   Musculoskeletal: Normal range of motion.  Neurological: He is oriented to person, place, and time.  Skin: Skin is warm and dry.   neurovascular status mildly diminished with yellow discoloration on the big toenails of both feet in the lateral corner     Assessment:     Mycotic nail infection of the hallux bilateral    Plan:     Reviewed condition and did initial H&P. I've recommended formulas 3 and dispensed today we'll consider stronger age and if symptoms persist and patient be seen back if any changes occur

## 2014-03-06 ENCOUNTER — Other Ambulatory Visit: Payer: Self-pay | Admitting: Urology

## 2014-03-06 DIAGNOSIS — N329 Bladder disorder, unspecified: Secondary | ICD-10-CM | POA: Diagnosis not present

## 2014-03-06 DIAGNOSIS — C61 Malignant neoplasm of prostate: Secondary | ICD-10-CM | POA: Diagnosis not present

## 2014-03-06 DIAGNOSIS — R3129 Other microscopic hematuria: Secondary | ICD-10-CM | POA: Diagnosis not present

## 2014-03-11 ENCOUNTER — Telehealth: Payer: Self-pay | Admitting: Internal Medicine

## 2014-03-11 NOTE — Telephone Encounter (Signed)
Received 6 pages from [facility], sent to Dr. [name]. 03/11/14/ss.

## 2014-03-14 ENCOUNTER — Encounter (HOSPITAL_BASED_OUTPATIENT_CLINIC_OR_DEPARTMENT_OTHER): Payer: Self-pay | Admitting: *Deleted

## 2014-03-14 NOTE — Progress Notes (Signed)
NPO AFTER MN. ARRIVE AT 0630. NEEDS ISTAT AND EKG.  WILL TAKE NORVASC, COZAAR, AND COREG AM DOS W/ SIPS OF WATER AND DO FLEET ENEMA.

## 2014-03-17 ENCOUNTER — Other Ambulatory Visit: Payer: Self-pay | Admitting: *Deleted

## 2014-03-17 MED ORDER — AMLODIPINE BESYLATE 10 MG PO TABS: ORAL_TABLET | ORAL | Status: DC

## 2014-03-17 MED ORDER — CARVEDILOL PHOSPHATE ER 20 MG PO CP24: ORAL_CAPSULE | ORAL | Status: DC

## 2014-03-19 ENCOUNTER — Encounter (HOSPITAL_BASED_OUTPATIENT_CLINIC_OR_DEPARTMENT_OTHER): Payer: Self-pay | Admitting: Anesthesiology

## 2014-03-19 NOTE — Anesthesia Preprocedure Evaluation (Addendum)
Anesthesia Evaluation  Patient identified by MRN, date of birth, ID band Patient awake    Reviewed: Allergy & Precautions, H&P , NPO status , Patient's Chart, lab work & pertinent test results  Airway Mallampati: II TM Distance: >3 FB Neck ROM: Full    Dental  (+) Partial Upper, Partial Lower   Pulmonary sleep apnea , COPDformer smoker,  Lung cancer  Room air sat 99% breath sounds clear to auscultation  Pulmonary exam normal       Cardiovascular hypertension, Pt. on medications and Pt. on home beta blockers negative cardio ROS  Rhythm:Regular Rate:Normal  ECG: NSR, ? LVH   Neuro/Psych H/O PTSDH/O Bell's Palsy, idiopathic peripheral neuropathy  Neuromuscular disease negative psych ROS   GI/Hepatic negative GI ROS, Neg liver ROS,   Endo/Other  negative endocrine ROS  Renal/GU negative Renal ROS  negative genitourinary   Musculoskeletal  (+) Arthritis -,   Abdominal (+) + obese,   Peds negative pediatric ROS (+)  Hematology negative hematology ROS (+)   Anesthesia Other Findings   Reproductive/Obstetrics negative OB ROS                       Anesthesia Physical Anesthesia Plan  ASA: III  Anesthesia Plan: General   Post-op Pain Management:    Induction: Intravenous  Airway Management Planned: LMA  Additional Equipment:   Intra-op Plan:   Post-operative Plan: Extubation in OR  Informed Consent: I have reviewed the patients History and Physical, chart, labs and discussed the procedure including the risks, benefits and alternatives for the proposed anesthesia with the patient or authorized representative who has indicated his/her understanding and acceptance.   Dental advisory given  Plan Discussed with: CRNA  Anesthesia Plan Comments:         Anesthesia Quick Evaluation

## 2014-03-19 NOTE — H&P (Signed)
Active Problems Problems  1. Benign prostatic hyperplasia with urinary obstruction (600.01,599.69) 2. Bladder disorder (596.9) 3. Dysuria (788.1) 4. Elevated prostate specific antigen (PSA) (790.93) 5. Erectile dysfunction due to arterial insufficiency (607.84) 6. Hypogonadism, testicular (257.2) 7. Microscopic hematuria (599.72) 8. PIN III (prostatic intraepithelial neoplasia III) (233.4) 9. Prostate cancer (185) 10. Prostatitis (601.9) 11. Pyuria (791.9)  History of Present Illness Mr. Zwilling returns today in f/u. He was here to discuss his upcoming prostate biopsy for a rise in his PSA to 19. He has prostate cancer and is on active surveillance. He had a biopsy in 2014 that had 1 core of low volume 7(3+4) disease.  He completed a 1 month course of levaquin at the end of July.  He continues to have burning and his UA today has microhematuria.  He has had increased nocturia for the last couple of weeks and went 8 x last night. He is voiding with intermittency.  He remains on tamsulosin as well.  He has no associated signs or symptoms.   Past Medical History Problems  1. History of Benign Polyps Of The Large Intestine (V12.72) 2. History of Chronic obstructive pulmonary disease (496) 3. History of Gout (274.9) 4. History of hypercholesterolemia (V12.29) 5. History of hypertension (V12.59) 6. History of urinary tract infection (V13.02) 7. History of Idiopathic peripheral neuropathy (356.9) 8. History of Lung Cancer (V10.11)  Surgical History Problems  1. History of Biopsy Of The Prostate Needle 2. History of Complete Colonoscopy 3. History of Forearm Nonunion Repair 4. History of Lung Surgery 5. History of Surgery Epididymis Excision Of Spermatocele 6. History of Tonsillectomy  Current Meds 1. Aspirin 81 MG Oral Tablet;  Therapy: (Recorded:08Oct2009) to Recorded 2. Calcium + D TABS;  Therapy: (Recorded:13Mar2008) to Recorded 3. Claritin TABS;  Therapy: (Recorded:01Jun2015)  to Recorded 4. Coreg CR 20 MG Oral Capsule Extended Release 24 Hour;  Therapy: 12Apr2012 to Recorded 5. Cozaar 100 MG Oral Tablet;  Therapy: (Recorded:06Aug2014) to Recorded 6. Norvasc 10 MG Oral Tablet;  Therapy: 16Aug2011 to Recorded 7. Tamsulosin HCl - 0.4 MG Oral Capsule; TAKE 1 CAPSULE Daily;  Therapy: 08Apr2010 to (Evaluate:16Jul2016)  Requested for: [id number]; Last  Rx:22Jul2015 Ordered 8. Uribel 118 MG Oral Capsule; Take 1 tablet twice a day or three times a day prn dysuria;  Therapy: [phone] to (Last Rx:28Jul2015)  Requested for: 28Jul2015 Ordered  Allergies Medication  1. nisoldipine 2. Statins 3. EPINEPHrine SOLN 4. Iodine SOLN Non-Medication  5. Shellfish  Family History Problems  1. Family history of Death In The Family Father : Father   Prostate cancerAge 81 2. Family history of Death In The Family Mother : Mother   Natural CausesAge 90 3. Family history of Family Health Status Number Of Children   Two sons that are twins and two daughters 32. Family history of Prostate Cancer (Y09.98) : Father  Social History Problems  1. Denied: Alcohol Use 2. Caffeine Use   2 cups max per day 3. Former Smoker   Quit in 2000 after smoking for 55 yrs 4. Marital History - Currently Married 5. Retired From Work  Past and social history reviewed and updated.   Review of Systems Genitourinary, constitutional, skin, eye, otolaryngeal, hematologic/lymphatic, cardiovascular, pulmonary, endocrine, musculoskeletal, gastrointestinal, neurological and psychiatric system(s) were reviewed and pertinent findings if present are noted.  Genitourinary: dysuria, nocturia, weak urinary stream and urinary stream starts and stops.  Gastrointestinal: no flank pain and no abdominal pain.  Constitutional: no recent weight loss.  Cardiovascular: no chest pain.  Respiratory: no shortness of breath.  Musculoskeletal: no bone pain.    Physical Exam Constitutional: Well nourished and  well developed . No acute distress.  Pulmonary: No respiratory distress and normal respiratory rhythm and effort.  Cardiovascular: Heart rate and rhythm are normal . No peripheral edema.    Results/Data Urine [Data Includes: Last 1 Day]   03Sep2015  COLOR GREEN   APPEARANCE CLEAR   SPECIFIC GRAVITY 1.020   pH 6.5   GLUCOSE NEG mg/dL  BILIRUBIN SMALL   KETONE NEG mg/dL  BLOOD LARGE   PROTEIN TRACE mg/dL  UROBILINOGEN 0.2 mg/dL  NITRITE NEG   LEUKOCYTE ESTERASE NEG   SQUAMOUS EPITHELIAL/HPF RARE   WBC 0-2 WBC/hpf  RBC 11-20 RBC/hpf  BACTERIA RARE   CRYSTALS NONE SEEN   CASTS NONE SEEN   Other     The following clinical lab reports were reviewed:  UA and PSA reviewed.  Flow Rate: Voided 215 ml. A peak flow rate of 52ml/s, mean flow rate of 39ml/s and reduced but sustained. flow curve . Selected Results  PSA 25Aug2015 09:53AM Irine Seal  SPECIMEN TYPE: BLOOD   Test Name Result Flag Reference  PSA 19.07 ng/mL H <=4.00  TEST METHODOLOGY: ECLIA PSA (ELECTROCHEMILUMINESCENCE IMMUNOASSAY)   URINE CULTURE [id number] 11:54AM Irine Seal  SOURCE : CLEAN CATCH SPECIMEN TYPE: URINE   Test Name Result Flag Reference  CULTURE, URINE Culture, Urine    ===== COLONY COUNT: =====  NO GROWTH   FINAL REPORT: NO GROWTH   Procedure  Procedure: Cystoscopy   Indication: Hematuria. Lower Urinary Tract Symptoms.  Informed Consent: Risks, benefits, and potential adverse events were discussed and informed consent was obtained from the patient.  Prep: The patient was prepped with betadine.  Antibiotic prophylaxis: Ciprofloxacin.  Procedure Note:  Urethral meatus:. No abnormalities.  Anterior urethra: No abnormalities.  Prostatic urethra: No abnormalities . Estimated length was 3 cm. The lateral prostatic lobes were enlarged. No intravesical median lobe was visualized.  Bladder: Visulization was clear. The ureteral orifices were in the normal anatomic position bilaterally and had clear  efflux of urine. A systematic survey of the bladder demonstrated no bladder tumors or stones. Examination of the bladder demonstrated moderate trabeculation erythematous mucosa (3-4 velvety patches on the posterior wall and dome that are worrisome for CIS. ). The patient tolerated the procedure well.  Complications: None.    Assessment Assessed  1. Microscopic hematuria (599.72) 2. Prostate cancer (185) 3. Bladder disorder (596.9)  He has erythema on the posterior wall that is worrisome for CIS particularly in the face of his negative urine cultures.  His PSA is up as well so he will need a prostate biopsy in addition to cystoscopy with bladder biopsies and bilateral retrogrades.   Plan  Microscopic hematuria  1. Cysto; Status:Hold For - Appointment,Date of Service; Requested JOA:[id number];  Pyuria  2. Follow-up Lab Urine Office  Follow-up  Status: Hold For - Date of Service  Requested for:  03Sep2015  I will repeat a culture today but will set him up for the above procedures. The risks of bleeding, infection, bladder injury and voiding difficult were reviewed along with the risks of thrombotic events and anesthetic complications.      URINE CULTURE; Status:In Progress - Specimen/Data Collected;  Done: [id number]  Perform:[facility]; Due:05Sep2015; Marked Important; Last Updated TD:DUKGURK, Kirtland Bouchard; 03/06/2014 12:34:27 PM;Ordered; Today;   YHC:[id number] hematuria; Ordered VO:HYWVP, Kaelan Amble;   Discussion/Summary CC: Dr. [name].

## 2014-03-20 ENCOUNTER — Encounter (HOSPITAL_BASED_OUTPATIENT_CLINIC_OR_DEPARTMENT_OTHER)
Admission: RE | Disposition: A | Discharge status: Home / Self Care | Payer: Self-pay | Source: Ambulatory Visit | Attending: Urology

## 2014-03-20 ENCOUNTER — Ambulatory Visit (HOSPITAL_BASED_OUTPATIENT_CLINIC_OR_DEPARTMENT_OTHER): Payer: Medicare Other | Admitting: Anesthesiology

## 2014-03-20 ENCOUNTER — Encounter (HOSPITAL_BASED_OUTPATIENT_CLINIC_OR_DEPARTMENT_OTHER): Payer: Self-pay | Admitting: Anesthesiology

## 2014-03-20 ENCOUNTER — Ambulatory Visit (HOSPITAL_BASED_OUTPATIENT_CLINIC_OR_DEPARTMENT_OTHER)
Admission: RE | Admit: 2014-03-20 | Discharge: 2014-03-20 | Disposition: A | Discharge status: Home / Self Care | Payer: Medicare Other | Source: Ambulatory Visit | Attending: Urology | Admitting: Urology

## 2014-03-20 ENCOUNTER — Encounter (HOSPITAL_BASED_OUTPATIENT_CLINIC_OR_DEPARTMENT_OTHER): Payer: Medicare Other | Admitting: Anesthesiology

## 2014-03-20 DIAGNOSIS — J449 Chronic obstructive pulmonary disease, unspecified: Secondary | ICD-10-CM | POA: Insufficient documentation

## 2014-03-20 DIAGNOSIS — M109 Gout, unspecified: Secondary | ICD-10-CM | POA: Insufficient documentation

## 2014-03-20 DIAGNOSIS — Z860100 Personal history of colon polyps, unspecified: Secondary | ICD-10-CM | POA: Insufficient documentation

## 2014-03-20 DIAGNOSIS — I10 Essential (primary) hypertension: Secondary | ICD-10-CM | POA: Diagnosis not present

## 2014-03-20 DIAGNOSIS — R972 Elevated prostate specific antigen [PSA]: Secondary | ICD-10-CM | POA: Diagnosis not present

## 2014-03-20 DIAGNOSIS — E291 Testicular hypofunction: Secondary | ICD-10-CM | POA: Diagnosis not present

## 2014-03-20 DIAGNOSIS — Z85118 Personal history of other malignant neoplasm of bronchus and lung: Secondary | ICD-10-CM | POA: Diagnosis not present

## 2014-03-20 DIAGNOSIS — D400 Neoplasm of uncertain behavior of prostate: Secondary | ICD-10-CM | POA: Diagnosis not present

## 2014-03-20 DIAGNOSIS — N138 Other obstructive and reflux uropathy: Secondary | ICD-10-CM | POA: Insufficient documentation

## 2014-03-20 DIAGNOSIS — E785 Hyperlipidemia, unspecified: Secondary | ICD-10-CM | POA: Insufficient documentation

## 2014-03-20 DIAGNOSIS — N529 Male erectile dysfunction, unspecified: Secondary | ICD-10-CM | POA: Diagnosis not present

## 2014-03-20 DIAGNOSIS — N3090 Cystitis, unspecified without hematuria: Secondary | ICD-10-CM | POA: Diagnosis not present

## 2014-03-20 DIAGNOSIS — N329 Bladder disorder, unspecified: Secondary | ICD-10-CM | POA: Diagnosis not present

## 2014-03-20 DIAGNOSIS — G609 Hereditary and idiopathic neuropathy, unspecified: Secondary | ICD-10-CM | POA: Diagnosis not present

## 2014-03-20 DIAGNOSIS — J4489 Other specified chronic obstructive pulmonary disease: Secondary | ICD-10-CM | POA: Diagnosis not present

## 2014-03-20 DIAGNOSIS — R300 Dysuria: Secondary | ICD-10-CM | POA: Diagnosis not present

## 2014-03-20 DIAGNOSIS — C61 Malignant neoplasm of prostate: Secondary | ICD-10-CM | POA: Diagnosis not present

## 2014-03-20 DIAGNOSIS — N401 Enlarged prostate with lower urinary tract symptoms: Secondary | ICD-10-CM | POA: Insufficient documentation

## 2014-03-20 HISTORY — DX: Personal history of other diseases of the musculoskeletal system and connective tissue: Z87.39

## 2014-03-20 HISTORY — DX: Personal history of colon polyps, unspecified: Z86.0100

## 2014-03-20 HISTORY — DX: Personal history of other (healed) physical injury and trauma: Z87.828

## 2014-03-20 HISTORY — DX: Dependence on other enabling machines and devices: Z99.89

## 2014-03-20 HISTORY — DX: Benign prostatic hyperplasia without lower urinary tract symptoms: N40.0

## 2014-03-20 HISTORY — DX: Presence of dental prosthetic device (complete) (partial): Z97.2

## 2014-03-20 HISTORY — DX: Obstructive sleep apnea (adult) (pediatric): G47.33

## 2014-03-20 HISTORY — PX: CYSTOSCOPY W/ RETROGRADES: SHX1426

## 2014-03-20 HISTORY — PX: PROSTATE BIOPSY: SHX241

## 2014-03-20 HISTORY — DX: Personal history of other mental and behavioral disorders: Z86.59

## 2014-03-20 HISTORY — DX: Dysuria: R30.0

## 2014-03-20 HISTORY — DX: Personal history of colonic polyps: Z86.010

## 2014-03-20 HISTORY — DX: Personal history of other diseases of the nervous system and sense organs: Z86.69

## 2014-03-20 HISTORY — DX: Presence of spectacles and contact lenses: Z97.3

## 2014-03-20 HISTORY — DX: Personal history of other malignant neoplasm of bronchus and lung: Z85.118

## 2014-03-20 LAB — POCT I-STAT 4, (NA,K, GLUC, HGB,HCT)
Glucose, Bld: 99 mg/dL (ref 70–99)
HCT: 40 % (ref 39.0–52.0)
Hemoglobin: 13.6 g/dL (ref 13.0–17.0)
Potassium: 4.2 mEq/L (ref 3.7–5.3)
Sodium: 140 mEq/L (ref 137–147)

## 2014-03-20 SURGERY — BIOPSY, PROSTATE, RECTAL APPROACH, WITH US GUIDANCE
Anesthesia: General | Site: Prostate

## 2014-03-20 MED ORDER — GLYCOPYRROLATE 0.2 MG/ML IJ SOLN
INTRAMUSCULAR | Status: DC | PRN
  Administered 2014-03-20: 0.2 mg via INTRAVENOUS

## 2014-03-20 MED ORDER — DEXTROSE 5 % IV SOLN
2.0000 g | INTRAVENOUS | Status: DC | PRN
  Administered 2014-03-20: 2 g via INTRAVENOUS

## 2014-03-20 MED ORDER — FENTANYL CITRATE 0.05 MG/ML IJ SOLN
INTRAMUSCULAR | Status: DC | PRN
  Administered 2014-03-20 (×2): 50 ug via INTRAVENOUS

## 2014-03-20 MED ORDER — SODIUM CHLORIDE 0.9 % IV SOLN
250.0000 mL | INTRAVENOUS | Status: DC | PRN
  Filled 2014-03-20: qty 250

## 2014-03-20 MED ORDER — CIPROFLOXACIN IN D5W 400 MG/200ML IV SOLN
INTRAVENOUS | Status: AC
  Filled 2014-03-20: qty 200

## 2014-03-20 MED ORDER — CIPROFLOXACIN IN D5W 400 MG/200ML IV SOLN
400.0000 mg | INTRAVENOUS | Status: AC
  Administered 2014-03-20: 400 mg via INTRAVENOUS
  Filled 2014-03-20: qty 200

## 2014-03-20 MED ORDER — IOHEXOL 350 MG/ML SOLN
INTRAVENOUS | Status: DC | PRN
  Administered 2014-03-20: 17 mL via URETHRAL

## 2014-03-20 MED ORDER — MIDAZOLAM HCL 5 MG/5ML IJ SOLN: INTRAMUSCULAR | Status: DC | PRN

## 2014-03-20 MED ORDER — LACTATED RINGERS IV SOLN
INTRAVENOUS | Status: DC | PRN
  Administered 2014-03-20: 08:00:00 via INTRAVENOUS

## 2014-03-20 MED ORDER — CEFTRIAXONE SODIUM 2 G IJ SOLR
INTRAMUSCULAR | Status: AC
  Filled 2014-03-20: qty 2

## 2014-03-20 MED ORDER — DEXTROSE 5 % IV SOLN
2.0000 g | INTRAVENOUS | Status: DC
  Filled 2014-03-20: qty 2

## 2014-03-20 MED ORDER — STERILE WATER FOR IRRIGATION IR SOLN
Status: DC | PRN
  Administered 2014-03-20: 3000 mL

## 2014-03-20 MED ORDER — LIDOCAINE HCL (CARDIAC) 20 MG/ML IV SOLN
INTRAVENOUS | Status: DC | PRN
  Administered 2014-03-20: 50 mg via INTRAVENOUS

## 2014-03-20 MED ORDER — PROPOFOL INFUSION 10 MG/ML OPTIME
INTRAVENOUS | Status: DC | PRN
  Administered 2014-03-20: 50 mL via INTRAVENOUS
  Administered 2014-03-20: 150 mL via INTRAVENOUS

## 2014-03-20 MED ORDER — FENTANYL CITRATE 0.05 MG/ML IJ SOLN
25.0000 ug | INTRAMUSCULAR | Status: DC | PRN
  Filled 2014-03-20: qty 1

## 2014-03-20 MED ORDER — ACETAMINOPHEN 325 MG PO TABS
650.0000 mg | ORAL_TABLET | ORAL | Status: DC | PRN
  Filled 2014-03-20: qty 2

## 2014-03-20 MED ORDER — OXYCODONE HCL 5 MG PO TABS
5.0000 mg | ORAL_TABLET | ORAL | Status: DC | PRN
  Filled 2014-03-20: qty 2

## 2014-03-20 MED ORDER — LACTATED RINGERS IV SOLN
INTRAVENOUS | Status: DC
  Administered 2014-03-20 (×2): via INTRAVENOUS
  Filled 2014-03-20: qty 1000

## 2014-03-20 MED ORDER — DEXAMETHASONE SODIUM PHOSPHATE 10 MG/ML IJ SOLN
INTRAMUSCULAR | Status: DC | PRN
  Administered 2014-03-20: 10 mg via INTRAVENOUS

## 2014-03-20 MED ORDER — EPHEDRINE SULFATE 50 MG/ML IJ SOLN
INTRAMUSCULAR | Status: DC | PRN
  Administered 2014-03-20 (×4): 10 mg via INTRAVENOUS

## 2014-03-20 MED ORDER — FENTANYL CITRATE 0.05 MG/ML IJ SOLN
INTRAMUSCULAR | Status: AC
  Filled 2014-03-20: qty 4

## 2014-03-20 MED ORDER — SODIUM CHLORIDE 0.9 % IJ SOLN
3.0000 mL | Freq: Two times a day (BID) | INTRAMUSCULAR | Status: DC
  Filled 2014-03-20: qty 3

## 2014-03-20 MED ORDER — ACETAMINOPHEN 650 MG RE SUPP
650.0000 mg | RECTAL | Status: DC | PRN
  Filled 2014-03-20: qty 1

## 2014-03-20 MED ORDER — KETOROLAC TROMETHAMINE 30 MG/ML IJ SOLN
INTRAMUSCULAR | Status: DC | PRN
  Administered 2014-03-20: 30 mg via INTRAVENOUS

## 2014-03-20 MED ORDER — SODIUM CHLORIDE 0.9 % IJ SOLN
3.0000 mL | INTRAMUSCULAR | Status: DC | PRN
  Filled 2014-03-20: qty 3

## 2014-03-20 MED ORDER — ONDANSETRON HCL 4 MG/2ML IJ SOLN
INTRAMUSCULAR | Status: DC | PRN
  Administered 2014-03-20: 4 mg via INTRAVENOUS

## 2014-03-20 MED ORDER — LEVOFLOXACIN 500 MG PO TABS: 500.0000 mg | ORAL_TABLET | Freq: Every day | ORAL | Status: DC

## 2014-03-20 MED ORDER — TRAMADOL HCL 50 MG PO TABS: 50.0000 mg | ORAL_TABLET | Freq: Four times a day (QID) | ORAL | Status: DC | PRN

## 2014-03-20 SURGICAL SUPPLY — 44 items
BAG DRAIN URO-CYSTO SKYTR STRL (DRAIN) ×2 IMPLANT
BAG DRN UROCATH (DRAIN)
BASKET LASER NITINOL 1.9FR (BASKET) IMPLANT
BASKET SEGURA 3FR (UROLOGICAL SUPPLIES) IMPLANT
BASKET STNLS GEMINI 4WIRE 3FR (BASKET) IMPLANT
BASKET ZERO TIP NITINOL 2.4FR (BASKET) IMPLANT
BSKT STON RTRVL 120 1.9FR (BASKET)
BSKT STON RTRVL GEM 120X11 3FR (BASKET)
BSKT STON RTRVL ZERO TP 2.4FR (BASKET)
CANISTER SUCT LVC 12 LTR MEDI- (MISCELLANEOUS) ×2 IMPLANT
CATH URET 5FR 28IN CONE TIP (BALLOONS)
CATH URET 5FR 28IN OPEN ENDED (CATHETERS) ×2 IMPLANT
CATH URET 5FR 70CM CONE TIP (BALLOONS) IMPLANT
CLOTH BEACON ORANGE TIMEOUT ST (SAFETY) ×4 IMPLANT
DRAPE CAMERA CLOSED 9X96 (DRAPES) ×4 IMPLANT
DRESSING TELFA 8X3 (GAUZE/BANDAGES/DRESSINGS) ×4 IMPLANT
ELECT REM PT RETURN 9FT ADLT (ELECTROSURGICAL) ×4
ELECTRODE REM PT RTRN 9FT ADLT (ELECTROSURGICAL) IMPLANT
FIBER LASER FLEXIVA 1000 (UROLOGICAL SUPPLIES) IMPLANT
FIBER LASER FLEXIVA 200 (UROLOGICAL SUPPLIES) IMPLANT
FIBER LASER FLEXIVA 365 (UROLOGICAL SUPPLIES) IMPLANT
FIBER LASER FLEXIVA 550 (UROLOGICAL SUPPLIES) IMPLANT
GLOVE BIOGEL PI IND STRL 6.5 (GLOVE) IMPLANT
GLOVE BIOGEL PI INDICATOR 6.5 (GLOVE) ×4
GLOVE SURG SS PI 8.0 STRL IVOR (GLOVE) ×6 IMPLANT
GOWN STRL REIN XL XLG (GOWN DISPOSABLE) ×2 IMPLANT
GOWN STRL REUS W/TWL LRG LVL3 (GOWN DISPOSABLE) ×4 IMPLANT
GOWN XL W/COTTON TOWEL STD (GOWNS) ×2 IMPLANT
GUIDEWIRE 0.038 PTFE COATED (WIRE) IMPLANT
GUIDEWIRE ANG ZIPWIRE 038X150 (WIRE) IMPLANT
GUIDEWIRE STR DUAL SENSOR (WIRE) ×2 IMPLANT
KIT BALLIN UROMAX 15FX10 (LABEL) IMPLANT
KIT BALLN UROMAX 15FX4 (MISCELLANEOUS) IMPLANT
KIT BALLN UROMAX 26 75X4 (MISCELLANEOUS)
LEGGING LITHOTOMY PAIR STRL (DRAPES) ×2 IMPLANT
PACK CYSTO (CUSTOM PROCEDURE TRAY) ×2 IMPLANT
PACK CYSTOSCOPY (CUSTOM PROCEDURE TRAY) ×2 IMPLANT
SET HIGH PRES BAL DIL (LABEL)
SHEATH URET ACCESS 12FR/35CM (UROLOGICAL SUPPLIES) IMPLANT
SHEATH URET ACCESS 12FR/55CM (UROLOGICAL SUPPLIES) IMPLANT
SURGILUBE 2OZ TUBE FLIPTOP (MISCELLANEOUS) ×2 IMPLANT
TOWEL OR 17X24 6PK STRL BLUE (TOWEL DISPOSABLE) ×2 IMPLANT
UNDERPAD 30X30 INCONTINENT (UNDERPADS AND DIAPERS) ×2 IMPLANT
WATER STERILE IRR 3000ML UROMA (IV SOLUTION) ×2 IMPLANT

## 2014-03-20 NOTE — Anesthesia Postprocedure Evaluation (Signed)
  Anesthesia Post-op Note  Patient: Jonathan Medina  Procedure(s) Performed: Procedure(s) (LRB): BIOPSY TRANSRECTAL ULTRASONIC PROSTATE (TUBP)/ (N/A) CYSTO WITH BILATERAL RETROGRADE BLADDER BIOPSY/FULGURATION (Bilateral)  Patient Location: PACU  Anesthesia Type: General  Level of Consciousness: awake and alert   Airway and Oxygen Therapy: Patient Spontanous Breathing  Post-op Pain: mild  Post-op Assessment: Post-op Vital signs reviewed, Patient's Cardiovascular Status Stable, Respiratory Function Stable, Patent Airway and No signs of Nausea or vomiting  Last Vitals:  Filed Vitals:   03/20/14 1208  BP: 130/66  Pulse: 59  Temp: 36.4 C  Resp: 16    Post-op Vital Signs: stable   Complications: No apparent anesthesia complications

## 2014-03-20 NOTE — Op Note (Deleted)
Jonathan Medina, ROBIDEAU NO.:  [id number]  MEDICAL RECORD NO.:  [id number]  LOCATION:                               FACILITY:  [facility]  PHYSICIAN:  Marshall Cork. Jeffie Pollock, M.D.    DATE OF BIRTH:  [birth year]  DATE OF PROCEDURE:  03/20/2014 DATE OF DISCHARGE:  03/20/2014                              OPERATIVE REPORT   PROCEDURE: 1. Transrectal ultrasound with ultrasound-guided prostate biopsy. 2. Cystoscopy with bladder biopsy and fulguration. 3. Cystoscopy with bilateral retrograde pyelograms and interpretation.  PREOPERATIVE DIAGNOSIS:  History of Gleason 7 prostate cancer with rising PSA of 19 and cystoscopic evidence of bladder lesions suggestive of carcinoma in situ.  POSTOPERATIVE DIAGNOSIS:  History of Gleason 7 prostate cancer with rising PSA of 19 and cystoscopic evidence of bladder lesions suggestive of carcinoma in situ.  ANESTHESIA:  General.  ANTIBIOTICS:  Cipro and Rocephin.  SPECIMEN:  Twelve core prostate biopsy, and bladder wall biopsies from the right wall, posterior wall and dome.  SURGEON:  Marshall Cork. Jeffie Pollock, MD  ANESTHESIA:  General.  BLOOD LOSS:  Minimal.  COMPLICATIONS:  None.  INDICATIONS:  Mr. Wolke is an 80 year old white male with a history of low volume Gleason 7 prostate cancer who had elected surveillance.  His PSA has been rising and is now at 19, and it was felt that a repeat biopsy was indicated.  He was also having urinary symptoms and cystoscopy was performed which demonstrated velvety patches on the posterior wall of the bladder that are concerning for carcinoma in situ. It was felt that prostate ultrasound and biopsy was indicated along with cystoscopy, bilateral retrograde pyelography, and bladder biopsy and fulguration.  FINDINGS OF PROCEDURE:  He was given Cipro and Rocephin in operating room.  A general anesthetic was induced.  He was placed in the lithotomy position and fitted with PAS hose.  An initial time-out was  performed.  The 10 megahertz transrectal ultrasound probe was assembled and inserted and scanning was performed.  This actually demonstrated a normal- appearing prostate without hypoechoic lesions or significant calcifications.  The seminal vesicles were unremarkable.  The prostate had a volume of 58.5 mL with a length of 5.03 cm, a height of 3.67 cm and a width of 6.04 cm.  After the initial diagnostic scan was performed, a 12 core pattern biopsy was obtained in a standard distribution and each specimen was collected in a separate bottle to be sent to Pathology.  Once the transrectal biopsy had been completed, the patient was prepped with Hibiclens and draped in the usual sterile fashion.  Cystoscopy was performed using the 22-French scope and 12 and 70 degree lenses.  Examination revealed a normal urethra, the external sphincter was intact, the prostatic urethra was approximately 4 cm in length with bilobar hyperplasia and a small middle lobe with some obstruction. Examination of the bladder revealed the ureteral orifices in a normal anatomic position.  The bladder wall had mild trabeculation but on the right lateral wall, posterior wall, and dome there were velvety areas measuring 2-3 cm each that were worrisome for carcinoma in situ versus chronic inflammation.  Once the initial cystoscopy was performed, cup biopsies were obtained  from the right lateral wall, posterior wall, and dome, and then the areas of abnormal mucosa were generously fulgurated, although they could not be completely eliminated due to the extent.  At this point, once hemostasis was achieved, bilateral retrograde pyelography was performed using a 5-French open-end catheter and Omnipaque, a total of 17 mL was used.  The retrograde pyelography demonstrated a normal right internal collecting system and ureter without filling defects or hydronephrosis. There were some mild J-hooking of the distal ureter.  The  left internal collecting system had no filling defects.  The ureter was unremarkable apart from some mild J-hooking as on the right.  At this point, the final inspection revealed good hemostasis.  The bladder was drained and the cystoscope was removed.  The patient was then taken down from the lithotomy position.  His anesthetic was reversed.  He was moved to recovery room in stable condition.  There were no complications.     Marshall Cork. Jeffie Pollock, M.D.     JJW/MEDQ  D:  03/20/2014  T:  03/20/2014  Job:  [id number]

## 2014-03-20 NOTE — Interval H&P Note (Signed)
History and Physical Interval Note:  03/20/2014 7:39 AM  Jonathan Medina  has presented today for surgery, with the diagnosis of BLADDER LESION/PROSTATE CANCER  The various methods of treatment have been discussed with the patient and family. After consideration of risks, benefits and other options for treatment, the patient has consented to  Procedure(s): BIOPSY TRANSRECTAL ULTRASONIC PROSTATE (TUBP)/ (N/A) CYSTO WITH BILATERAL RETROGRADE BLADDER BIOPSY/FULGURATION (Bilateral) as a surgical intervention .  The patient's history has been reviewed, patient examined, no change in status, stable for surgery.  I have reviewed the patient's chart and labs.  Questions were answered to the patient's satisfaction.     [name]

## 2014-03-20 NOTE — Brief Op Note (Signed)
03/20/2014  8:39 AM  PATIENT:  Jonathan Medina  80 y.o. male  PRE-OPERATIVE DIAGNOSIS:  BLADDER LESION/PROSTATE CANCER  POST-OPERATIVE DIAGNOSIS:  BLADDER LESION/PROSTATE CANCER  PROCEDURE:  Procedure(s): BIOPSY TRANSRECTAL ULTRASONIC PROSTATE (TUBP)/ (N/A) CYSTO WITH BILATERAL RETROGRADE BLADDER BIOPSY/FULGURATION (Bilateral)  SURGEON:  Surgeon(s) and Role:    * Malka So, MD - Primary  PHYSICIAN ASSISTANT:   ASSISTANTS: none   ANESTHESIA:   general  EBL:     BLOOD ADMINISTERED:none  DRAINS: none   LOCAL MEDICATIONS USED:  NONE  SPECIMEN:  Source of Specimen:  12 core prostate biopsy.  cup biopsies from right bladder wall, posterior wall and dome.   DISPOSITION OF SPECIMEN:  PATHOLOGY  COUNTS:  YES  TOURNIQUET:  * No tourniquets in log *  DICTATION: .Other Dictation: Dictation Number [id number]  PLAN OF CARE: Discharge to home after PACU  PATIENT DISPOSITION:  PACU - hemodynamically stable.   Delay start of Pharmacological VTE agent (>24hrs) due to surgical blood loss or risk of bleeding: not applicable

## 2014-03-20 NOTE — Progress Notes (Signed)
#  18 foley catheter inserted with no problems, pink urine noted immediately with x 1 small blood clot. Tolerated well with immediate relief.

## 2014-03-20 NOTE — Progress Notes (Signed)
Unable to void after several attempts, bladder scan 351. Dr. Jeffie Pollock at bedside to report. See orders.

## 2014-03-20 NOTE — Transfer of Care (Signed)
Immediate Anesthesia Transfer of Care Note  Patient: Jonathan Medina  Procedure(s) Performed: Procedure(s): BIOPSY TRANSRECTAL ULTRASONIC PROSTATE (TUBP)/ (N/A) CYSTO WITH BILATERAL RETROGRADE BLADDER BIOPSY/FULGURATION (Bilateral)  Patient Location: PACU  Anesthesia Type:General  Level of Consciousness: awake, alert , oriented and patient cooperative  Airway & Oxygen Therapy: Patient Spontanous Breathing and Patient connected to nasal cannula oxygen  Post-op Assessment: Report given to PACU RN and Post -op Vital signs reviewed and stable  Post vital signs: Reviewed and stable  Complications: No apparent anesthesia complications

## 2014-03-20 NOTE — Discharge Instructions (Addendum)
Transrectal Ultrasound-Guided Biopsy A transrectal ultrasound-guided biopsy is a procedure to remove samples of tissue from your prostate using ultrasound images to guide the procedure. The procedure is usually done to evaluate the prostate gland of men who have an elevated prostate-specific antigen (PSA). PSA is a blood test to screen for prostate cancer. The biopsy samples are taken to check for prostate cancer.  LET [facility] CARE PROVIDER KNOW ABOUT:  Any allergies you have.  All medicines you are taking, including vitamins, herbs, eye drops, creams, and over-the-counter medicines.  Previous problems you or members of your family have had with the use of anesthetics.  Any blood disorders you have.  Previous surgeries you have had.  Medical conditions you have. RISKS AND COMPLICATIONS Generally, this is a safe procedure. However, as with any procedure, problems can occur. Possible problems include:  Infection of your prostate.  Bleeding from your rectum or blood in your urine.  Difficulty urinating.  Nerve damage (this is usually temporary).  Damage to surrounding structures such as blood vessels, organs, and muscles, which would require other procedures. BEFORE THE PROCEDURE  Do not eat or drink anything after midnight on the night before the procedure or as directed by your health care provider.  Take medicines only as directed by your health care provider.  Your health care provider may have you stop taking certain medicines 5-7 days before the procedure.  You will be given an enema before the procedure. During an enema, a liquid is injected into your rectum to clear out waste.  You may have lab tests the day of your procedure.   Plan to have someone take you home after the procedure. PROCEDURE   You will be given medicine to help you relax (sedative) before the procedure. An IV tube will be inserted into one of your veins and used to give fluids and  medicine.  You will be given antibiotic medicine to reduce the risk of an infection.  You will be placed on your side for the procedure.  A probe with lubricated gel will be placed into your rectum, and images will be taken of your prostate and surrounding structures.  Numbing medicine will be injected into the prostate before the biopsy samples are taken.  A biopsy needle will then be inserted and guided to your prostate with the use of the ultrasound images.  Samples of prostate tissue will be taken, and the needle will then be removed.  The biopsy samples will be sent to a lab to be analyzed. Results are usually back in 2-3 days. AFTER THE PROCEDURE  You will be taken to a recovery area where you will be monitored.  You may have some discomfort in the rectal area. You will be given pain medicines to control this.  You may be allowed to go home the same day, or you may need to stay in the hospital overnight. Document Released: 11/04/2013 Document Reviewed: 02/06/2013 [facility] Patient Information 2015 [place]. This information is not intended to replace advice given to you by your health care provider. Make sure you discuss any questions you have with your health care provider. Cystoscopy, Care After Refer to this sheet in the next few weeks. These instructions provide you with information on caring for yourself after your procedure. Your caregiver may also give you more specific instructions. Your treatment has been planned according to current medical practices, but problems sometimes occur. Call your caregiver if you have any problems or questions after your procedure.  HOME CARE INSTRUCTIONS  Things you can do to ease any discomfort after your procedure include:  Drinking enough water and fluids to keep your urine clear or pale yellow.  Taking a warm bath to relieve any burning feelings. SEEK IMMEDIATE MEDICAL CARE IF:   You have an increase in blood in your  urine.  You notice blood clots in your urine.  You have difficulty passing urine.  You have the chills.  You have abdominal pain.  You have a fever or persistent symptoms for more than 2-3 days.  You have a fever and your symptoms suddenly get worse. MAKE SURE YOU:   Understand these instructions.  Will watch your condition.  Will get help right away if you are not doing well or get worse. Document Released: 01/07/2005 Document Revised: 02/20/2013 Document Reviewed: 12/12/2011 [facility] Patient Information 2015 [place], [place]. This information is not intended to replace advice given to you by your health care provider. Make sure you discuss any questions you have with your health care provider.   Post Anesthesia Home Care Instructions  Activity: Get plenty of rest for the remainder of the day. A responsible adult should stay with you for 24 hours following the procedure.  For the next 24 hours, DO NOT: -Drive a car -Paediatric nurse -Drink alcoholic beverages -Take any medication unless instructed by your physician -Make any legal decisions or sign important papers.  Meals: Start with liquid foods such as gelatin or soup. Progress to regular foods as tolerated. Avoid greasy, spicy, heavy foods. If nausea and/or vomiting occur, drink only clear liquids until the nausea and/or vomiting subsides. Call your physician if vomiting continues.  Special Instructions/Symptoms: Your throat may feel dry or sore from the anesthesia or the breathing tube placed in your throat during surgery. If this causes discomfort, gargle with warm salt water. The discomfort should disappear within 24 hours.

## 2014-03-20 NOTE — Anesthesia Procedure Notes (Signed)
Procedure Name: LMA Insertion Date/Time: 03/20/2014 7:59 AM Performed by: Wanita Chamberlain Pre-anesthesia Checklist: Patient identified, Timeout performed, Emergency Drugs available, Suction available and Patient being monitored Patient Re-evaluated:Patient Re-evaluated prior to inductionOxygen Delivery Method: Circle system utilized Intubation Type: IV induction Ventilation: Mask ventilation without difficulty LMA: LMA inserted LMA Size: 4.0 Number of attempts: 1 Airway Equipment and Method: Bite block Placement Confirmation: positive ETCO2 Tube secured with: Tape Dental Injury: Teeth and Oropharynx as per pre-operative assessment

## 2014-03-20 NOTE — Op Note (Signed)
NAMEORLO, BRICKLE NO.:  [id number]  MEDICAL RECORD NO.:  [id number]  LOCATION:                               FACILITY:  [facility]  PHYSICIAN:  Marshall Cork. Jeffie Pollock, M.D.    DATE OF BIRTH:  03-May-1930  DATE OF PROCEDURE:  03/20/2014 DATE OF DISCHARGE:  03/20/2014                              OPERATIVE REPORT   PROCEDURE: 1. Transrectal ultrasound with ultrasound-guided prostate biopsy. 2. Cystoscopy with bladder biopsy and fulguration. 3. Cystoscopy with bilateral retrograde pyelograms and interpretation.  PREOPERATIVE DIAGNOSIS:  History of Gleason 7 prostate cancer with rising PSA of 19 and cystoscopic evidence of bladder lesions suggestive of carcinoma in situ.  POSTOPERATIVE DIAGNOSIS:  History of Gleason 7 prostate cancer with rising PSA of 19 and cystoscopic evidence of bladder lesions suggestive of carcinoma in situ.  ANESTHESIA:  General.  ANTIBIOTICS:  Cipro and Rocephin.  SPECIMEN:  Twelve core prostate biopsy, and bladder wall biopsies from the right wall, posterior wall and dome.  SURGEON:  Marshall Cork. Jeffie Pollock, MD  ANESTHESIA:  General.  BLOOD LOSS:  Minimal.  COMPLICATIONS:  None.  INDICATIONS:  Mr. Jonathan Medina is an 80 year old white male with a history of low volume Gleason 7 prostate cancer who had elected surveillance.  His PSA has been rising and is now at 19, and it was felt that a repeat biopsy was indicated.  He was also having urinary symptoms and cystoscopy was performed which demonstrated velvety patches on the posterior wall of the bladder that are concerning for carcinoma in situ. It was felt that prostate ultrasound and biopsy was indicated along with cystoscopy, bilateral retrograde pyelography, and bladder biopsy and fulguration.  FINDINGS OF PROCEDURE:  He was given Cipro and Rocephin in operating room.  A general anesthetic was induced.  He was placed in the lithotomy position and fitted with PAS hose.  An initial time-out was  performed.  The 10 megahertz transrectal ultrasound probe was assembled and inserted and scanning was performed.  This actually demonstrated a normal- appearing prostate without hypoechoic lesions or significant calcifications.  The seminal vesicles were unremarkable.  The prostate had a volume of 58.5 mL with a length of 5.03 cm, a height of 3.67 cm and a width of 6.04 cm.  After the initial diagnostic scan was performed, a 12 core pattern biopsy was obtained in a standard distribution and each specimen was collected in a separate bottle to be sent to Pathology.  Once the transrectal biopsy had been completed, the patient was prepped with Hibiclens and draped in the usual sterile fashion.  Cystoscopy was performed using the 22-French scope and 12 and 70 degree lenses.  Examination revealed a normal urethra, the external sphincter was intact, the prostatic urethra was approximately 4 cm in length with bilobar hyperplasia and a small middle lobe with some obstruction. Examination of the bladder revealed the ureteral orifices in a normal anatomic position.  The bladder wall had mild trabeculation but on the right lateral wall, posterior wall, and dome there were velvety areas measuring 2-3 cm each that were worrisome for carcinoma in situ versus chronic inflammation.  Once the initial cystoscopy was performed, cup biopsies were obtained  from the right lateral wall, posterior wall, and dome, and then the areas of abnormal mucosa were generously fulgurated, although they could not be completely eliminated due to the extent.  At this point, once hemostasis was achieved, bilateral retrograde pyelography was performed using a 5-French open-end catheter and Omnipaque, a total of 17 mL was used.  The retrograde pyelography demonstrated a normal right internal collecting system and ureter without filling defects or hydronephrosis. There were some mild J-hooking of the distal ureter.  The  left internal collecting system had no filling defects.  The ureter was unremarkable apart from some mild J-hooking as on the right.  At this point, the final inspection revealed good hemostasis.  The bladder was drained and the cystoscope was removed.  The patient was then taken down from the lithotomy position.  His anesthetic was reversed.  He was moved to recovery room in stable condition.  There were no complications.     Marshall Cork. Jeffie Pollock, M.D.     JJW/MEDQ  D:  03/20/2014  T:  03/20/2014  Job:  [id number]

## 2014-03-21 ENCOUNTER — Encounter (HOSPITAL_BASED_OUTPATIENT_CLINIC_OR_DEPARTMENT_OTHER): Payer: Self-pay | Admitting: Urology

## 2014-04-03 DIAGNOSIS — R300 Dysuria: Secondary | ICD-10-CM | POA: Diagnosis not present

## 2014-04-03 DIAGNOSIS — C61 Malignant neoplasm of prostate: Secondary | ICD-10-CM | POA: Diagnosis not present

## 2014-04-03 DIAGNOSIS — R312 Other microscopic hematuria: Secondary | ICD-10-CM | POA: Diagnosis not present

## 2014-04-16 ENCOUNTER — Ambulatory Visit (INDEPENDENT_AMBULATORY_CARE_PROVIDER_SITE_OTHER): Payer: Medicare Other | Admitting: Internal Medicine

## 2014-04-16 ENCOUNTER — Other Ambulatory Visit (INDEPENDENT_AMBULATORY_CARE_PROVIDER_SITE_OTHER): Payer: Medicare Other

## 2014-04-16 ENCOUNTER — Encounter: Payer: Self-pay | Admitting: Internal Medicine

## 2014-04-16 VITALS — BP 120/78 | HR 70 | Temp 98.1°F | Resp 16 | Ht 68.0 in | Wt 236.0 lb

## 2014-04-16 DIAGNOSIS — R739 Hyperglycemia, unspecified: Secondary | ICD-10-CM | POA: Diagnosis not present

## 2014-04-16 DIAGNOSIS — Z23 Encounter for immunization: Secondary | ICD-10-CM | POA: Diagnosis not present

## 2014-04-16 DIAGNOSIS — E785 Hyperlipidemia, unspecified: Secondary | ICD-10-CM | POA: Diagnosis not present

## 2014-04-16 DIAGNOSIS — Z0000 Encounter for general adult medical examination without abnormal findings: Secondary | ICD-10-CM

## 2014-04-16 DIAGNOSIS — I10 Essential (primary) hypertension: Secondary | ICD-10-CM | POA: Diagnosis not present

## 2014-04-16 DIAGNOSIS — R7303 Prediabetes: Secondary | ICD-10-CM | POA: Insufficient documentation

## 2014-04-16 LAB — CBC WITH DIFFERENTIAL/PLATELET
BASOS ABS: 0 10*3/uL (ref 0.0–0.1)
Basophils Relative: 0.5 % (ref 0.0–3.0)
EOS ABS: 0.2 10*3/uL (ref 0.0–0.7)
Eosinophils Relative: 3.3 % (ref 0.0–5.0)
HCT: 37.5 % — ABNORMAL LOW (ref 39.0–52.0)
Hemoglobin: 12.3 g/dL — ABNORMAL LOW (ref 13.0–17.0)
LYMPHS PCT: 35.5 % (ref 12.0–46.0)
Lymphs Abs: 2.3 10*3/uL (ref 0.7–4.0)
MCHC: 32.8 g/dL (ref 30.0–36.0)
MCV: 89.3 fl (ref 78.0–100.0)
MONO ABS: 0.5 10*3/uL (ref 0.1–1.0)
Monocytes Relative: 8.2 % (ref 3.0–12.0)
NEUTROS PCT: 52.5 % (ref 43.0–77.0)
Neutro Abs: 3.4 10*3/uL (ref 1.4–7.7)
PLATELETS: 313 10*3/uL (ref 150.0–400.0)
RBC: 4.2 Mil/uL — ABNORMAL LOW (ref 4.22–5.81)
RDW: 13.8 % (ref 11.5–15.5)
WBC: 6.6 10*3/uL (ref 4.0–10.5)

## 2014-04-16 LAB — COMPREHENSIVE METABOLIC PANEL
ALT: 12 U/L (ref 0–53)
AST: 15 U/L (ref 0–37)
Albumin: 3.5 g/dL (ref 3.5–5.2)
Alkaline Phosphatase: 77 U/L (ref 39–117)
BILIRUBIN TOTAL: 0.6 mg/dL (ref 0.2–1.2)
BUN: 20 mg/dL (ref 6–23)
CALCIUM: 9.6 mg/dL (ref 8.4–10.5)
CO2: 27 meq/L (ref 19–32)
CREATININE: 1.1 mg/dL (ref 0.4–1.5)
Chloride: 105 mEq/L (ref 96–112)
GFR: 83.22 mL/min (ref >60.00)
GLUCOSE: 91 mg/dL (ref 70–99)
Potassium: 4.3 mEq/L (ref 3.5–5.1)
Sodium: 137 mEq/L (ref 135–145)
Total Protein: 7.3 g/dL (ref 6.0–8.3)

## 2014-04-16 LAB — LIPID PANEL
CHOLESTEROL: 220 mg/dL — AB (ref 0–200)
HDL: 31.2 mg/dL — AB (ref >39.00)
NonHDL: 188.8
TRIGLYCERIDES: 279 mg/dL — AB (ref 0.0–149.0)
Total CHOL/HDL Ratio: 7
VLDL: 55.8 mg/dL — ABNORMAL HIGH (ref 0.0–40.0)

## 2014-04-16 LAB — HEMOGLOBIN A1C: HEMOGLOBIN A1C: 5.8 % (ref 4.6–6.5)

## 2014-04-16 LAB — LDL CHOLESTEROL, DIRECT: Direct LDL: 136 mg/dL

## 2014-04-16 LAB — TSH: TSH: 1.8 u[IU]/mL (ref 0.35–4.50)

## 2014-04-16 NOTE — Assessment & Plan Note (Signed)
He will not take a statin

## 2014-04-16 NOTE — Assessment & Plan Note (Signed)
He has pre-diabetes and will work on his lifestyle modifications to lower his blood sugars

## 2014-04-16 NOTE — Patient Instructions (Signed)
Health Maintenance A healthy lifestyle and preventative care can promote health and wellness.  Maintain regular health, dental, and eye exams.  Eat a healthy diet. Foods like vegetables, fruits, whole grains, low-fat dairy products, and lean protein foods contain the nutrients you need and are low in calories. Decrease your intake of foods high in solid fats, added sugars, and salt. Get information about a proper diet from your health care provider, if necessary.  Regular physical exercise is one of the most important things you can do for your health. Most adults should get at least 150 minutes of moderate-intensity exercise (any activity that increases your heart rate and causes you to sweat) each week. In addition, most adults need muscle-strengthening exercises on 2 or more days a week.   Maintain a healthy weight. The body mass index (BMI) is a screening tool to identify possible weight problems. It provides an estimate of body fat based on height and weight. Your health care provider can find your BMI and can help you achieve or maintain a healthy weight. For males 20 years and older:  A BMI below 18.5 is considered underweight.  A BMI of 18.5 to 24.9 is normal.  A BMI of 25 to 29.9 is considered overweight.  A BMI of 30 and above is considered obese.  Maintain normal blood lipids and cholesterol by exercising and minimizing your intake of saturated fat. Eat a balanced diet with plenty of fruits and vegetables. Blood tests for lipids and cholesterol should begin at age 20 and be repeated every 5 years. If your lipid or cholesterol levels are high, you are over age 50, or you are at high risk for heart disease, you may need your cholesterol levels checked more frequently.Ongoing high lipid and cholesterol levels should be treated with medicines if diet and exercise are not working.  If you smoke, find out from your health care provider how to quit. If you do not use tobacco, do not  start.  Lung cancer screening is recommended for adults aged 55-80 years who are at high risk for developing lung cancer because of a history of smoking. A yearly low-dose CT scan of the lungs is recommended for people who have at least a 30-pack-year history of smoking and are current smokers or have quit within the past 15 years. A pack year of smoking is smoking an average of 1 pack of cigarettes a day for 1 year (for example, a 30-pack-year history of smoking could mean smoking 1 pack a day for 30 years or 2 packs a day for 15 years). Yearly screening should continue until the smoker has stopped smoking for at least 15 years. Yearly screening should be stopped for people who develop a health problem that would prevent them from having lung cancer treatment.  If you choose to drink alcohol, do not have more than 2 drinks per day. One drink is considered to be 12 oz (360 mL) of beer, 5 oz (150 mL) of wine, or 1.5 oz (45 mL) of liquor.  Avoid the use of street drugs. Do not share needles with anyone. Ask for help if you need support or instructions about stopping the use of drugs.  High blood pressure causes heart disease and increases the risk of stroke. Blood pressure should be checked at least every 1-2 years. Ongoing high blood pressure should be treated with medicines if weight loss and exercise are not effective.  If you are 45-79 years old, ask your health care provider if   you should take aspirin to prevent heart disease.  Diabetes screening involves taking a blood sample to check your fasting blood sugar level. This should be done once every 3 years after age 45 if you are at a normal weight and without risk factors for diabetes. Testing should be considered at a younger age or be carried out more frequently if you are overweight and have at least 1 risk factor for diabetes.  Colorectal cancer can be detected and often prevented. Most routine colorectal cancer screening begins at the age of 50  and continues through age 75. However, your health care provider may recommend screening at an earlier age if you have risk factors for colon cancer. On a yearly basis, your health care provider may provide home test kits to check for hidden blood in the stool. A small camera at the end of a tube may be used to directly examine the colon (sigmoidoscopy or colonoscopy) to detect the earliest forms of colorectal cancer. Talk to your health care provider about this at age 50 when routine screening begins. A direct exam of the colon should be repeated every 5-10 years through age 75, unless early forms of precancerous polyps or small growths are found.  People who are at an increased risk for hepatitis B should be screened for this virus. You are considered at high risk for hepatitis B if:  You were born in a country where hepatitis B occurs often. Talk with your health care provider about which countries are considered high risk.  Your parents were born in a high-risk country and you have not received a shot to protect against hepatitis B (hepatitis B vaccine).  You have HIV or AIDS.  You use needles to inject street drugs.  You live with, or have sex with, someone who has hepatitis B.  You are a man who has sex with other men (MSM).  You get hemodialysis treatment.  You take certain medicines for conditions like cancer, organ transplantation, and autoimmune conditions.  Hepatitis C blood testing is recommended for all people born from 1945 through 1965 and any individual with known risk factors for hepatitis C.  Healthy men should no longer receive prostate-specific antigen (PSA) blood tests as part of routine cancer screening. Talk to your health care provider about prostate cancer screening.  Testicular cancer screening is not recommended for adolescents or adult males who have no symptoms. Screening includes self-exam, a health care provider exam, and other screening tests. Consult with your  health care provider about any symptoms you have or any concerns you have about testicular cancer.  Practice safe sex. Use condoms and avoid high-risk sexual practices to reduce the spread of sexually transmitted infections (STIs).  You should be screened for STIs, including gonorrhea and chlamydia if:  You are sexually active and are younger than 24 years.  You are older than 24 years, and your health care provider tells you that you are at risk for this type of infection.  Your sexual activity has changed since you were last screened, and you are at an increased risk for chlamydia or gonorrhea. Ask your health care provider if you are at risk.  If you are at risk of being infected with HIV, it is recommended that you take a prescription medicine daily to prevent HIV infection. This is called pre-exposure prophylaxis (PrEP). You are considered at risk if:  You are a man who has sex with other men (MSM).  You are a heterosexual man who   is sexually active with multiple partners.  You take drugs by injection.  You are sexually active with a partner who has HIV.  Talk with your health care provider about whether you are at high risk of being infected with HIV. If you choose to begin PrEP, you should first be tested for HIV. You should then be tested every 3 months for as long as you are taking PrEP.  Use sunscreen. Apply sunscreen liberally and repeatedly throughout the day. You should seek shade when your shadow is shorter than you. Protect yourself by wearing long sleeves, pants, a wide-brimmed hat, and sunglasses year round whenever you are outdoors.  Tell your health care provider of new moles or changes in moles, especially if there is a change in shape or color. Also, tell your health care provider if a mole is larger than the size of a pencil eraser.  A one-time screening for abdominal aortic aneurysm (AAA) and surgical repair of large AAAs by ultrasound is recommended for men aged  65-75 years who are current or former smokers.  Stay current with your vaccines (immunizations). Document Released: 12/17/2007 Document Revised: 06/25/2013 Document Reviewed: 11/15/2010 [employer] Patient Information 2015 [employer]. This information is not intended to replace advice given to you by your health care provider. Make sure you discuss any questions you have with your health care provider.  Hypertension Hypertension, commonly called high blood pressure, is when the force of blood pumping through your arteries is too strong. Your arteries are the blood vessels that carry blood from your heart throughout your body. A blood pressure reading consists of a higher number over a lower number, such as 110/72. The higher number (systolic) is the pressure inside your arteries when your heart pumps. The lower number (diastolic) is the pressure inside your arteries when your heart relaxes. Ideally you want your blood pressure below 120/80. Hypertension forces your heart to work harder to pump blood. Your arteries may become narrow or stiff. Having hypertension puts you at risk for heart disease, stroke, and other problems.  RISK FACTORS Some risk factors for high blood pressure are controllable. Others are not.  Risk factors you cannot control include:   Race. You may be at higher risk if you are African American.  Age. Risk increases with age.  Gender. Men are at higher risk than women before age 45 years. After age 65, women are at higher risk than men. Risk factors you can control include:  Not getting enough exercise or physical activity.  Being overweight.  Getting too much fat, sugar, calories, or salt in your diet.  Drinking too much alcohol. SIGNS AND SYMPTOMS Hypertension does not usually cause signs or symptoms. Extremely high blood pressure (hypertensive crisis) may cause headache, anxiety, shortness of breath, and nosebleed. DIAGNOSIS  To check if you have hypertension,  your health care provider will measure your blood pressure while you are seated, with your arm held at the level of your heart. It should be measured at least twice using the same arm. Certain conditions can cause a difference in blood pressure between your right and left arms. A blood pressure reading that is higher than normal on one occasion does not mean that you need treatment. If one blood pressure reading is high, ask your health care provider about having it checked again. TREATMENT  Treating high blood pressure includes making lifestyle changes and possibly taking medicine. Living a healthy lifestyle can help lower high blood pressure. You may need to change   some of your habits. Lifestyle changes may include:  Following the DASH diet. This diet is high in fruits, vegetables, and whole grains. It is low in salt, red meat, and added sugars.  Getting at least 2 hours of brisk physical activity every week.  Losing weight if necessary.  Not smoking.  Limiting alcoholic beverages.  Learning ways to reduce stress. If lifestyle changes are not enough to get your blood pressure under control, your health care provider may prescribe medicine. You may need to take more than one. Work closely with your health care provider to understand the risks and benefits. HOME CARE INSTRUCTIONS  Have your blood pressure rechecked as directed by your health care provider.   Take medicines only as directed by your health care provider. Follow the directions carefully. Blood pressure medicines must be taken as prescribed. The medicine does not work as well when you skip doses. Skipping doses also puts you at risk for problems.   Do not smoke.   Monitor your blood pressure at home as directed by your health care provider. SEEK MEDICAL CARE IF:   You think you are having a reaction to medicines taken.  You have recurrent headaches or feel dizzy.  You have swelling in your ankles.  You have  trouble with your vision. SEEK IMMEDIATE MEDICAL CARE IF:  You develop a severe headache or confusion.  You have unusual weakness, numbness, or feel faint.  You have severe chest or abdominal pain.  You vomit repeatedly.  You have trouble breathing. MAKE SURE YOU:   Understand these instructions.  Will watch your condition.  Will get help right away if you are not doing well or get worse. Document Released: 06/20/2005 Document Revised: 11/04/2013 Document Reviewed: 04/12/2013 [employer] Patient Information 2015 [employer]. This information is not intended to replace advice given to you by your health care provider. Make sure you discuss any questions you have with your health care provider.  

## 2014-04-16 NOTE — Assessment & Plan Note (Signed)
His BP is well controlled Lytes and renal function are stable 

## 2014-04-16 NOTE — Assessment & Plan Note (Signed)

## 2014-04-16 NOTE — Progress Notes (Signed)
Subjective:    Patient ID: Jonathan Medina, male    DOB: [birth year], 80 y.o.   MRN: [id number]  Hypertension This is a chronic problem. The current episode started more than 1 year ago. The problem is unchanged. The problem is controlled. Pertinent negatives include no anxiety, blurred vision, chest pain, headaches, malaise/fatigue, neck pain, orthopnea, palpitations, peripheral edema, PND, shortness of breath or sweats. There are no associated agents to hypertension. Risk factors for coronary artery disease include obesity. Past treatments include beta blockers, calcium channel blockers and angiotensin blockers. The current treatment provides significant improvement. Compliance problems include diet and exercise.  Identifiable causes of hypertension include sleep apnea.      Review of Systems  Constitutional: Negative.  Negative for fever, chills, malaise/fatigue, diaphoresis, appetite change and fatigue.  HENT: Negative.   Eyes: Negative.  Negative for blurred vision.  Respiratory: Positive for apnea. Negative for choking, chest tightness, shortness of breath, wheezing and stridor.   Cardiovascular: Negative.  Negative for chest pain, palpitations, orthopnea, leg swelling and PND.  Gastrointestinal: Negative.  Negative for nausea, vomiting, abdominal pain, diarrhea, constipation and blood in stool.  Endocrine: Negative.   Genitourinary: Negative.  Negative for dysuria, urgency, frequency, hematuria, decreased urine volume, enuresis and difficulty urinating.  Musculoskeletal: Positive for arthralgias. Negative for back pain, gait problem, joint swelling, myalgias and neck pain.  Skin: Negative.  Negative for rash.  Allergic/Immunologic: Negative.   Neurological: Negative.  Negative for dizziness, tremors, syncope, light-headedness and headaches.  Hematological: Negative.  Negative for adenopathy. Does not bruise/bleed easily.  Psychiatric/Behavioral: Negative.        Objective:   Physical Exam  Vitals reviewed. Constitutional: He is oriented to person, place, and time. He appears well-developed and well-nourished. No distress.  HENT:  Head: Normocephalic and atraumatic.  Mouth/Throat: Oropharynx is clear and moist. No oropharyngeal exudate.  Eyes: Conjunctivae are normal. Right eye exhibits no discharge. Left eye exhibits no discharge. No scleral icterus.  Neck: Normal range of motion. Neck supple. No JVD present. No tracheal deviation present. No thyromegaly present.  Cardiovascular: Normal rate, regular rhythm, normal heart sounds and intact distal pulses.  Exam reveals no gallop and no friction rub.   No murmur heard. Pulmonary/Chest: Effort normal and breath sounds normal. No stridor. No respiratory distress. He has no wheezes. He has no rales. He exhibits no tenderness.  Abdominal: Soft. Bowel sounds are normal. He exhibits no distension and no mass. There is no tenderness. There is no rebound and no guarding.  Genitourinary:  Exam deferred at his request as he just had prostate surgery  Musculoskeletal: Normal range of motion. He exhibits no edema and no tenderness.  Lymphadenopathy:    He has no cervical adenopathy.  Neurological: He is oriented to person, place, and time.  Skin: Skin is warm and dry. No rash noted. He is not diaphoretic. No erythema. No pallor.  Psychiatric: He has a normal mood and affect. His behavior is normal. Judgment and thought content normal.     Lab Results  Component Value Date   WBC 7.0 06/21/2012   HGB 13.6 03/20/2014   HCT 40.0 03/20/2014   PLT 289.0 06/21/2012   GLUCOSE 99 03/20/2014   CHOL 224* 07/11/2013   TRIG 142.0 07/11/2013   HDL 43.20 07/11/2013   LDLDIRECT 149.2 07/11/2013   LDLCALC 119* 05/17/2010   ALT 14 01/03/2013   ALT 14 01/03/2013   AST 18 01/03/2013   AST 18 01/03/2013   NA 140 03/20/2014  K 4.2 03/20/2014   CL 105 07/11/2013   CREATININE 1.1 07/11/2013   BUN 17 07/11/2013   CO2 29 07/11/2013   TSH 1.44 06/21/2012   PSA  3.16 06/24/2008   INR 0.9 08/07/2007   HGBA1C 6.2 10/09/2008       Assessment & Plan:

## 2014-04-18 ENCOUNTER — Telehealth: Payer: Self-pay | Admitting: Internal Medicine

## 2014-04-18 NOTE — Telephone Encounter (Signed)
Pt having a hard time pulling up mychart on his computer. He is requesting a copy of his most recent labs.

## 2014-04-18 NOTE — Telephone Encounter (Signed)
Called pt back inform him will mail out to his address,,,/lmb

## 2014-07-10 DIAGNOSIS — C61 Malignant neoplasm of prostate: Secondary | ICD-10-CM | POA: Diagnosis not present
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
## 2014-07-16 DIAGNOSIS — R312 Other microscopic hematuria: Secondary | ICD-10-CM | POA: Diagnosis not present

## 2014-07-16 DIAGNOSIS — N401 Enlarged prostate with lower urinary tract symptoms: Secondary | ICD-10-CM | POA: Diagnosis not present

## 2014-07-16 DIAGNOSIS — C61 Malignant neoplasm of prostate: Secondary | ICD-10-CM | POA: Diagnosis not present

## 2014-07-16 DIAGNOSIS — R351 Nocturia: Secondary | ICD-10-CM | POA: Diagnosis not present

## 2014-07-16 DIAGNOSIS — N329 Bladder disorder, unspecified: Secondary | ICD-10-CM | POA: Diagnosis not present

## 2014-07-17 ENCOUNTER — Other Ambulatory Visit: Payer: Self-pay | Admitting: Urology

## 2014-07-17 DIAGNOSIS — C61 Malignant neoplasm of prostate: Secondary | ICD-10-CM

## 2014-07-31 ENCOUNTER — Encounter (HOSPITAL_COMMUNITY)
Admission: RE | Admit: 2014-07-31 | Discharge: 2014-07-31 | Disposition: A | Discharge status: Home / Self Care | Payer: Medicare Other | Source: Ambulatory Visit | Attending: Urology | Admitting: Urology

## 2014-07-31 ENCOUNTER — Encounter (HOSPITAL_COMMUNITY): Admission: RE | Admit: 2014-07-31 | Payer: Medicare Other | Source: Ambulatory Visit

## 2014-07-31 DIAGNOSIS — C61 Malignant neoplasm of prostate: Secondary | ICD-10-CM | POA: Insufficient documentation

## 2014-07-31 MED ORDER — TECHNETIUM TC 99M MEDRONATE IV KIT
27.1000 | PACK | Freq: Once | INTRAVENOUS | Status: AC | PRN
  Administered 2014-07-31: 27.1 via INTRAVENOUS

## 2014-08-12 DIAGNOSIS — C61 Malignant neoplasm of prostate: Secondary | ICD-10-CM | POA: Diagnosis not present

## 2014-08-14 ENCOUNTER — Ambulatory Visit (INDEPENDENT_AMBULATORY_CARE_PROVIDER_SITE_OTHER)
Admission: RE | Admit: 2014-08-14 | Discharge: 2014-08-14 | Disposition: A | Discharge status: Home / Self Care | Payer: Medicare Other | Source: Ambulatory Visit | Attending: Pulmonary Disease | Admitting: Pulmonary Disease

## 2014-08-14 ENCOUNTER — Ambulatory Visit (INDEPENDENT_AMBULATORY_CARE_PROVIDER_SITE_OTHER): Payer: Medicare Other | Admitting: Pulmonary Disease

## 2014-08-14 ENCOUNTER — Encounter: Payer: Self-pay | Admitting: Pulmonary Disease

## 2014-08-14 VITALS — BP 102/64 | HR 54 | Ht 68.0 in | Wt 235.2 lb

## 2014-08-14 DIAGNOSIS — G4733 Obstructive sleep apnea (adult) (pediatric): Secondary | ICD-10-CM | POA: Diagnosis not present

## 2014-08-14 DIAGNOSIS — C3490 Malignant neoplasm of unspecified part of unspecified bronchus or lung: Secondary | ICD-10-CM | POA: Diagnosis not present

## 2014-08-14 DIAGNOSIS — Z9989 Dependence on other enabling machines and devices: Principal | ICD-10-CM

## 2014-08-14 DIAGNOSIS — C3412 Malignant neoplasm of upper lobe, left bronchus or lung: Secondary | ICD-10-CM

## 2014-08-14 NOTE — Patient Instructions (Signed)
CXR today We discussed cpap supplies Your machine is set at 10 cm

## 2014-08-14 NOTE — Assessment & Plan Note (Signed)
CXR today.  

## 2014-08-14 NOTE — Progress Notes (Signed)
   Subjective:    Patient ID: Jonathan Medina, male    DOB: [birth year], 81 y.o.   MRN: [id number]  HPI  84/M, [place] veteran, ex smoker for FU of obstructive sleep apnea & chronic insomnia/ shift work disorder h/o VATS LUlobectomy 08/2007 Arlyce Dice) for lung cancer  He has PTSD & takes cymbalta daytime for many years. he also has long standing insomnia of sleep initiation & maintenance, since his youth. 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  PSG in dec'11 showed poor sleep efficiency with TST of 197 mins, AHI 12/h , desatn < 88% x 26 mins & lowest desaturation to 76%. BMI was 35  Download 2/8-09/07/10 >> 5 h compliance, 10 cm OK  New face mask helped,  Download 3/1-3/30/12 >> 4.5 h, no events  Download 9/13 on 10 cm shows good usage avg 5h, few residual events AHI 5/h, some leak   08/14/2014  Chief Complaint  Patient presents with  . Follow-up    Pt wears CPAP nightly x avg 5.5 hrs. Denies any problems with mask or pressure setting.    Pt states he wears his CPAP everynight 5-6 hrs a night. Pt gets his mask changed often so no problems/ no problems with machine (only when there is bad weather).  Wifereports - nightmares have stopped, no snoring.  Feels rested, no leak, pressure ok.  Changed to medium pillows  Takes cymbalta & neurontin at night   CXR - no new infx         Review of Systems  neg for any significant sore throat, dysphagia, itching, sneezing, nasal congestion or excess/ purulent secretions, fever, chills, sweats, unintended wt loss, pleuritic or exertional cp, hempoptysis, orthopnea pnd or change in chronic leg swelling. Also denies presyncope, palpitations, heartburn, abdominal pain, nausea, vomiting, diarrhea or change in bowel or urinary habits, dysuria,hematuria, rash, arthralgias, visual complaints, headache, numbness weakness or ataxia.     Objective:   Physical Exam   Gen.  Pleasant, obese, in no distress ENT - no lesions, no post nasal drip Neck: No JVD, no thyromegaly, no carotid bruits Lungs: no use of accessory muscles, no dullness to percussion, decreased without rales or rhonchi  Cardiovascular: Rhythm regular, heart sounds  normal, no murmurs or gallops, no peripheral edema Musculoskeletal: No deformities, no cyanosis or clubbing , no tremors        Assessment & Plan:

## 2014-08-14 NOTE — Assessment & Plan Note (Signed)
We discussed cpap supplies Your machine is set at 10 cm  Weight loss encouraged, compliance with goal of at least 4-6 hrs every night is the expectation. Advised against medications with sedative side effects Cautioned against driving when sleepy - understanding that sleepiness will vary on a day to day basis

## 2014-09-14 ENCOUNTER — Other Ambulatory Visit: Payer: Self-pay | Admitting: Internal Medicine

## 2014-09-22 ENCOUNTER — Encounter: Payer: Self-pay | Admitting: Internal Medicine

## 2014-09-22 ENCOUNTER — Ambulatory Visit (INDEPENDENT_AMBULATORY_CARE_PROVIDER_SITE_OTHER): Payer: Medicare Other | Admitting: Internal Medicine

## 2014-09-22 ENCOUNTER — Other Ambulatory Visit (INDEPENDENT_AMBULATORY_CARE_PROVIDER_SITE_OTHER): Payer: Medicare Other

## 2014-09-22 VITALS — BP 120/70 | HR 60 | Temp 97.8°F | Resp 14 | Ht 68.0 in | Wt 236.0 lb

## 2014-09-22 DIAGNOSIS — R739 Hyperglycemia, unspecified: Secondary | ICD-10-CM

## 2014-09-22 DIAGNOSIS — E785 Hyperlipidemia, unspecified: Secondary | ICD-10-CM | POA: Diagnosis not present

## 2014-09-22 DIAGNOSIS — I10 Essential (primary) hypertension: Secondary | ICD-10-CM

## 2014-09-22 DIAGNOSIS — Z23 Encounter for immunization: Secondary | ICD-10-CM | POA: Diagnosis not present

## 2014-09-22 LAB — BASIC METABOLIC PANEL
BUN: 16 mg/dL (ref 6–23)
CALCIUM: 9.4 mg/dL (ref 8.4–10.5)
CO2: 29 mEq/L (ref 19–32)
CREATININE: 1.12 mg/dL (ref 0.40–1.50)
Chloride: 103 mEq/L (ref 96–112)
GFR: 80.56 mL/min (ref >60.00)
Glucose, Bld: 93 mg/dL (ref 70–99)
POTASSIUM: 4.4 meq/L (ref 3.5–5.1)
SODIUM: 138 meq/L (ref 135–145)

## 2014-09-22 LAB — LIPID PANEL
CHOLESTEROL: 220 mg/dL — AB (ref 0–200)
HDL: 43.6 mg/dL (ref >39.00)
LDL CALC: 144 mg/dL — AB (ref 0–99)
NONHDL: 176.4
Total CHOL/HDL Ratio: 5
Triglycerides: 163 mg/dL — ABNORMAL HIGH (ref 0.0–149.0)
VLDL: 32.6 mg/dL (ref 0.0–40.0)

## 2014-09-22 LAB — HEMOGLOBIN A1C: Hgb A1c MFr Bld: 6.3 % (ref 4.6–6.5)

## 2014-09-22 NOTE — Progress Notes (Signed)
Pre visit review using our clinic review tool, if applicable. No additional management support is needed unless otherwise documented below in the visit note. 

## 2014-09-22 NOTE — Progress Notes (Signed)
   Subjective:    Patient ID: Jonathan Medina, male    DOB: 1929-11-06, 81 y.o.   MRN: [id number]  Hypertension This is a chronic problem. The current episode started more than 1 year ago. The problem is unchanged. The problem is controlled. Pertinent negatives include no anxiety, blurred vision, chest pain, headaches, malaise/fatigue, neck pain, orthopnea, palpitations, peripheral edema, PND, shortness of breath or sweats. There are no associated agents to hypertension. Past treatments include alpha 1 blockers, angiotensin blockers, calcium channel blockers and beta blockers. The current treatment provides moderate improvement. Compliance problems include diet and exercise.  Identifiable causes of hypertension include sleep apnea.      Review of Systems  Constitutional: Negative.  Negative for fever, chills, malaise/fatigue, diaphoresis, appetite change and fatigue.  HENT: Negative.   Eyes: Negative.  Negative for blurred vision.  Respiratory: Positive for apnea. Negative for cough, choking, shortness of breath and stridor.   Cardiovascular: Negative.  Negative for chest pain, palpitations, orthopnea, leg swelling and PND.  Gastrointestinal: Negative.  Negative for nausea, vomiting, abdominal pain, diarrhea, constipation and blood in stool.  Endocrine: Negative.   Genitourinary: Negative.   Musculoskeletal: Negative.  Negative for myalgias, back pain, joint swelling, arthralgias and neck pain.  Skin: Negative.  Negative for rash.  Allergic/Immunologic: Negative.   Neurological: Negative.  Negative for dizziness and headaches.  Hematological: Negative.  Negative for adenopathy. Does not bruise/bleed easily.  Psychiatric/Behavioral: Negative.        Objective:   Physical Exam  Constitutional: He is oriented to person, place, and time. He appears well-developed and well-nourished. No distress.  HENT:  Head: Normocephalic and atraumatic.  Mouth/Throat: Oropharynx is clear and moist. No  oropharyngeal exudate.  Eyes: Conjunctivae are normal. Right eye exhibits no discharge. Left eye exhibits no discharge. No scleral icterus.  Neck: Normal range of motion. Neck supple. No JVD present. No tracheal deviation present. No thyromegaly present.  Cardiovascular: Normal rate, regular rhythm, normal heart sounds and intact distal pulses.  Exam reveals no gallop and no friction rub.   No murmur heard. Pulmonary/Chest: Effort normal and breath sounds normal. No stridor. No respiratory distress. He has no wheezes. He has no rales. He exhibits no tenderness.  Abdominal: Soft. Bowel sounds are normal. He exhibits no distension and no mass. There is no tenderness. There is no rebound and no guarding.  Musculoskeletal: Normal range of motion. He exhibits no edema or tenderness.  Lymphadenopathy:    He has no cervical adenopathy.  Neurological: He is oriented to person, place, and time.  Skin: Skin is warm and dry. No rash noted. He is not diaphoretic. No erythema. No pallor.  Vitals reviewed.     Lab Results  Component Value Date   WBC 6.6 04/16/2014   HGB 12.3* 04/16/2014   HCT 37.5* 04/16/2014   PLT 313.0 04/16/2014   GLUCOSE 91 04/16/2014   CHOL 220* 04/16/2014   TRIG 279.0* 04/16/2014   HDL 31.20* 04/16/2014   LDLDIRECT 136.0 04/16/2014   LDLCALC 119* 05/17/2010   ALT 12 04/16/2014   AST 15 04/16/2014   NA 137 04/16/2014   K 4.3 04/16/2014   CL 105 04/16/2014   CREATININE 1.1 04/16/2014   BUN 20 04/16/2014   CO2 27 04/16/2014   TSH 1.80 04/16/2014   PSA 3.16 06/24/2008   INR 0.9 08/07/2007   HGBA1C 5.8 04/16/2014      Assessment & Plan:

## 2014-09-22 NOTE — Patient Instructions (Signed)

## 2014-09-23 NOTE — Assessment & Plan Note (Signed)
LDL-C is not at goal He refuses to take a medication for this

## 2014-09-23 NOTE — Assessment & Plan Note (Signed)
His BP is well controlled Lytes and renal function are stable 

## 2014-10-15 DIAGNOSIS — C61 Malignant neoplasm of prostate: Secondary | ICD-10-CM | POA: Diagnosis not present

## 2014-10-22 DIAGNOSIS — R972 Elevated prostate specific antigen [PSA]: Secondary | ICD-10-CM | POA: Diagnosis not present

## 2014-10-22 DIAGNOSIS — R300 Dysuria: Secondary | ICD-10-CM | POA: Diagnosis not present

## 2014-10-22 DIAGNOSIS — N419 Inflammatory disease of prostate, unspecified: Secondary | ICD-10-CM | POA: Diagnosis not present

## 2014-10-22 DIAGNOSIS — R312 Other microscopic hematuria: Secondary | ICD-10-CM | POA: Diagnosis not present

## 2014-10-22 DIAGNOSIS — C61 Malignant neoplasm of prostate: Secondary | ICD-10-CM | POA: Diagnosis not present

## 2014-10-22 DIAGNOSIS — R351 Nocturia: Secondary | ICD-10-CM | POA: Diagnosis not present

## 2014-12-02 DIAGNOSIS — E291 Testicular hypofunction: Secondary | ICD-10-CM | POA: Diagnosis not present

## 2014-12-02 DIAGNOSIS — N419 Inflammatory disease of prostate, unspecified: Secondary | ICD-10-CM | POA: Diagnosis not present

## 2014-12-08 DIAGNOSIS — C61 Malignant neoplasm of prostate: Secondary | ICD-10-CM | POA: Diagnosis not present

## 2014-12-08 DIAGNOSIS — R300 Dysuria: Secondary | ICD-10-CM | POA: Diagnosis not present

## 2014-12-08 DIAGNOSIS — E291 Testicular hypofunction: Secondary | ICD-10-CM | POA: Diagnosis not present

## 2014-12-08 DIAGNOSIS — R351 Nocturia: Secondary | ICD-10-CM | POA: Diagnosis not present

## 2014-12-09 DIAGNOSIS — D485 Neoplasm of uncertain behavior of skin: Secondary | ICD-10-CM | POA: Diagnosis not present

## 2014-12-09 DIAGNOSIS — D224 Melanocytic nevi of scalp and neck: Secondary | ICD-10-CM | POA: Diagnosis not present

## 2014-12-09 DIAGNOSIS — D3611 Benign neoplasm of peripheral nerves and autonomic nervous system of face, head, and neck: Secondary | ICD-10-CM | POA: Diagnosis not present

## 2014-12-25 ENCOUNTER — Other Ambulatory Visit: Payer: Self-pay

## 2014-12-25 NOTE — Telephone Encounter (Deleted)
Received refill request from [facility]   request refills for Losartan 100 mg Coreg CR 20 mg .

## 2014-12-29 ENCOUNTER — Other Ambulatory Visit: Payer: Self-pay | Admitting: *Deleted

## 2014-12-29 MED ORDER — CARVEDILOL PHOSPHATE ER 20 MG PO CP24: ORAL_CAPSULE | ORAL | Status: DC

## 2014-12-29 MED ORDER — LOSARTAN POTASSIUM 100 MG PO TABS: 100.0000 mg | ORAL_TABLET | Freq: Every morning | ORAL | Status: DC

## 2014-12-30 ENCOUNTER — Other Ambulatory Visit: Payer: Self-pay | Admitting: Internal Medicine

## 2014-12-30 NOTE — Telephone Encounter (Signed)
ERROR

## 2014-12-30 NOTE — Telephone Encounter (Signed)
Duplicate

## 2015-01-07 DIAGNOSIS — R351 Nocturia: Secondary | ICD-10-CM | POA: Diagnosis not present

## 2015-01-07 DIAGNOSIS — N329 Bladder disorder, unspecified: Secondary | ICD-10-CM | POA: Diagnosis not present

## 2015-01-07 DIAGNOSIS — R300 Dysuria: Secondary | ICD-10-CM | POA: Diagnosis not present

## 2015-02-26 ENCOUNTER — Other Ambulatory Visit: Payer: Self-pay

## 2015-02-26 MED ORDER — LOSARTAN POTASSIUM 100 MG PO TABS: 100.0000 mg | ORAL_TABLET | Freq: Every morning | ORAL | Status: DC

## 2015-02-26 MED ORDER — CARVEDILOL PHOSPHATE ER 20 MG PO CP24: ORAL_CAPSULE | ORAL | Status: DC

## 2015-02-26 NOTE — Addendum Note (Signed)
Addended by: Levonne Lapping on: 02/26/2015 04:36 PM   Modules accepted: Orders

## 2015-03-11 DIAGNOSIS — R351 Nocturia: Secondary | ICD-10-CM | POA: Diagnosis not present

## 2015-03-14 ENCOUNTER — Other Ambulatory Visit: Payer: Self-pay | Admitting: Internal Medicine

## 2015-03-16 DIAGNOSIS — N401 Enlarged prostate with lower urinary tract symptoms: Secondary | ICD-10-CM | POA: Diagnosis not present

## 2015-03-16 DIAGNOSIS — R350 Frequency of micturition: Secondary | ICD-10-CM | POA: Diagnosis not present

## 2015-03-16 DIAGNOSIS — R351 Nocturia: Secondary | ICD-10-CM | POA: Diagnosis not present

## 2015-03-16 DIAGNOSIS — N138 Other obstructive and reflux uropathy: Secondary | ICD-10-CM | POA: Diagnosis not present

## 2015-03-24 ENCOUNTER — Other Ambulatory Visit (INDEPENDENT_AMBULATORY_CARE_PROVIDER_SITE_OTHER): Payer: Medicare Other

## 2015-03-24 ENCOUNTER — Encounter: Payer: Self-pay | Admitting: Internal Medicine

## 2015-03-24 ENCOUNTER — Ambulatory Visit (INDEPENDENT_AMBULATORY_CARE_PROVIDER_SITE_OTHER): Payer: Medicare Other | Admitting: Internal Medicine

## 2015-03-24 VITALS — BP 148/69 | HR 67 | Temp 98.0°F | Resp 16 | Ht 68.0 in | Wt 227.0 lb

## 2015-03-24 DIAGNOSIS — I10 Essential (primary) hypertension: Secondary | ICD-10-CM | POA: Diagnosis not present

## 2015-03-24 DIAGNOSIS — D510 Vitamin B12 deficiency anemia due to intrinsic factor deficiency: Secondary | ICD-10-CM

## 2015-03-24 DIAGNOSIS — D638 Anemia in other chronic diseases classified elsewhere: Secondary | ICD-10-CM | POA: Insufficient documentation

## 2015-03-24 DIAGNOSIS — R739 Hyperglycemia, unspecified: Secondary | ICD-10-CM | POA: Diagnosis not present

## 2015-03-24 DIAGNOSIS — Z23 Encounter for immunization: Secondary | ICD-10-CM

## 2015-03-24 LAB — BASIC METABOLIC PANEL
BUN: 17 mg/dL (ref 6–23)
CALCIUM: 9.8 mg/dL (ref 8.4–10.5)
CO2: 28 mEq/L (ref 19–32)
CREATININE: 1.02 mg/dL (ref 0.40–1.50)
Chloride: 102 mEq/L (ref 96–112)
GFR: 89.64 mL/min (ref >60.00)
GLUCOSE: 86 mg/dL (ref 70–99)
POTASSIUM: 4.6 meq/L (ref 3.5–5.1)
Sodium: 138 mEq/L (ref 135–145)

## 2015-03-24 LAB — CBC WITH DIFFERENTIAL/PLATELET
BASOS PCT: 0.9 % (ref 0.0–3.0)
Basophils Absolute: 0.1 10*3/uL (ref 0.0–0.1)
EOS PCT: 3.5 % (ref 0.0–5.0)
Eosinophils Absolute: 0.2 10*3/uL (ref 0.0–0.7)
HEMATOCRIT: 41.2 % (ref 39.0–52.0)
HEMOGLOBIN: 13.6 g/dL (ref 13.0–17.0)
LYMPHS PCT: 34.8 % (ref 12.0–46.0)
Lymphs Abs: 2.3 10*3/uL (ref 0.7–4.0)
MCHC: 32.9 g/dL (ref 30.0–36.0)
MCV: 89.5 fl (ref 78.0–100.0)
MONOS PCT: 7.4 % (ref 3.0–12.0)
Monocytes Absolute: 0.5 10*3/uL (ref 0.1–1.0)
Neutro Abs: 3.6 10*3/uL (ref 1.4–7.7)
Neutrophils Relative %: 53.4 % (ref 43.0–77.0)
Platelets: 311 10*3/uL (ref 150.0–400.0)
RBC: 4.61 Mil/uL (ref 4.22–5.81)
RDW: 13.6 % (ref 11.5–15.5)
WBC: 6.7 10*3/uL (ref 4.0–10.5)

## 2015-03-24 LAB — VITAMIN B12: Vitamin B-12: 324 pg/mL (ref 211–911)

## 2015-03-24 LAB — IBC PANEL
Iron: 59 ug/dL (ref 42–165)
SATURATION RATIOS: 13.9 % — AB (ref 20.0–50.0)
Transferrin: 303 mg/dL (ref 212.0–360.0)

## 2015-03-24 LAB — TSH: TSH: 1.04 u[IU]/mL (ref 0.35–4.50)

## 2015-03-24 LAB — FOLATE: Folate: 17.3 ng/mL (ref >5.9)

## 2015-03-24 LAB — FERRITIN: FERRITIN: 16.1 ng/mL — AB (ref 22.0–322.0)

## 2015-03-24 LAB — HEMOGLOBIN A1C: Hgb A1c MFr Bld: 6 % (ref 4.6–6.5)

## 2015-03-24 NOTE — Progress Notes (Signed)
Pre visit review using our clinic review tool, if applicable. No additional management support is needed unless otherwise documented below in the visit note. 

## 2015-03-24 NOTE — Patient Instructions (Signed)
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
## 2015-03-25 ENCOUNTER — Encounter: Payer: Self-pay | Admitting: Internal Medicine

## 2015-05-04 DIAGNOSIS — C61 Malignant neoplasm of prostate: Secondary | ICD-10-CM | POA: Diagnosis not present

## 2015-05-04 DIAGNOSIS — R300 Dysuria: Secondary | ICD-10-CM | POA: Diagnosis not present

## 2015-05-04 DIAGNOSIS — R972 Elevated prostate specific antigen [PSA]: Secondary | ICD-10-CM | POA: Diagnosis not present

## 2015-05-04 DIAGNOSIS — R3129 Other microscopic hematuria: Secondary | ICD-10-CM | POA: Diagnosis not present

## 2015-05-05 ENCOUNTER — Other Ambulatory Visit: Payer: Self-pay | Admitting: Urology

## 2015-05-05 ENCOUNTER — Encounter (HOSPITAL_BASED_OUTPATIENT_CLINIC_OR_DEPARTMENT_OTHER): Payer: Self-pay | Admitting: *Deleted

## 2015-05-05 NOTE — Progress Notes (Signed)
NPO AFTER MN.  ARRIVE AT 0930.  NEEDS ISTAT 8 AND EKG.  WILL TAKE AM MEDS W/ SIPS OF WATER AND DO FLEET ENEMA.

## 2015-05-11 NOTE — H&P (Signed)
ctive Problems Problems  1. Benign prostatic hyperplasia with urinary obstruction (N40.1,N13.8) 2. Bladder disorder (N32.9) 3. Dysuria (R30.0) 4. Elevated prostate specific antigen (PSA) (R97.20) 5. Erectile dysfunction due to arterial insufficiency (N52.01) 6. Hypogonadism, testicular (E29.1) 7. Microscopic hematuria (R31.29) 8. Nocturia (R35.1) 9. PIN III (prostatic intraepithelial neoplasia III) (D07.5) 10. Prostate cancer (C61) 11. Prostatitis (N41.9) 12. Pyuria (N39.0) 13. Urinary frequency (R35.0)  History of Present Illness Mr. Bowring returns today in f/u for his history of prostate cancer. His PSA is up to 37.8 from 32 in May and 31 in April and the PSADT is about a year.  He had a negative CT in February 2016. He remains on tamsulosin and is voiding ok but he can have nocturia 2-12x's.  He continues to have burning with voiding. He does worse when he is under stress but is ok if he is working in his yard. He tried uribel for the dysuria but that caused palpitations. He had facial flushing and elevated BP with Myrbetriq.  He has a history of bladder inflammation on bladder biopsy in 9/15. He had a negative AFB culture.  His prostate volume was 50m.  He was found to have a single core with <5% Gleason 7(3+4) in 2014.  His PSA rose but a repeat biopsy in September 2015 was negative. He had a negative CT Pelvis in 2/16 but didn't have a bonescan or MRI because of claustraphobia. He has no associated signs or symptoms.   Past Medical History Problems  1. History of Benign Polyps Of The Large Intestine 2. History of Chronic obstructive pulmonary disease (J44.9) 3. History of Gout (M10.9) 4. History of hypercholesterolemia (Z86.39) 5. History of hypertension (Z86.79) 6. History of urinary tract infection (Z87.440) 7. History of Idiopathic peripheral neuropathy (G60.9) 8. History of Lung Cancer  Surgical History Problems  1. History of Complete Colonoscopy 2. History of Forearm  Nonunion Repair 3. History of Lung Surgery 4. History of Needle Biopsy Of Prostate 5. History of Needle Biopsy Of Prostate 6. History of Surgery Epididymis Excision Of Spermatocele 7. History of Tonsillectomy  Current Meds 1. Aspirin 81 MG TABS;  Therapy: (Recorded:08Oct2009) to Recorded 2. Calcium + D TABS;  Therapy: (Recorded:13Mar2008) to Recorded 3. Coreg CR 20 MG Oral Capsule Extended Release 24 Hour;  Therapy: 12Apr2012 to Recorded 4. Cozaar 100 MG Oral Tablet;  Therapy: (Recorded:06Aug2014) to Recorded 5. Norvasc 10 MG Oral Tablet;  Therapy: 16Aug2011 to Recorded 6. Tamsulosin HCl - 0.4 MG Oral Capsule; TAKE 1 CAPSULE Daily;  Therapy: 08Apr2010 to (Evaluate:17Jun2017)  Requested for: 22Jun2016; Last  Rx:22Jun2016 Ordered  Allergies Medication  1. Myrbetriq TB24 2. nisoldipine 3. Statins 4. EPINEPHrine SOLN 5. Iodine SOLN Non-Medication  6. Shellfish  Family History Problems  1. Family history of Death In The Family Father : Father   Prostate cancerAge [age]. Family history of Death In The Family Mother : Mother   Natural CausesAge 9473. Family history of Family Health Status Number Of Children   Two sons that are twins and two daughters 480 Family history of Prostate Cancer : Father  Social History Problems  1. Denied: Alcohol Use (History) 2. Caffeine Use   2 cups max per day 3. Former Smoker   Quit in 2000 after smoking for 55 yrs 4. Marital History - Currently Married 5. Retired From Work  Past and social history reviewed and updated.   Review of Systems  Genitourinary: urinary frequency, feelings of urinary urgency, dysuria and nocturia, but no hematuria.  Gastrointestinal: no diarrhea and no constipation.  Constitutional: no recent weight loss.  Musculoskeletal: joint pain (knee pain on the left after some recent lifting. ).    Vitals Vital Signs [Data Includes: Last 1 Day]  Recorded: 31Oct2016 03:01PM  Blood Pressure: 127 /  71 Temperature: 97.6 F Heart Rate: 67  Physical Exam Constitutional: Well nourished and well developed . No acute distress.  Pulmonary: No respiratory distress and normal respiratory rhythm and effort.  Cardiovascular: Heart rate and rhythm are normal . No peripheral edema.  Abdomen: The abdomen is obese. The abdomen is soft and nontender.  No inguinal hernia is present on the right.  No inguinal hernia is present on the left.  Rectal: Rectal exam demonstrates normal sphincter tone, no tenderness and no masses. Estimated prostate size is 3+. The prostate has no nodularity and is not tender. The left seminal vesicle is nonpalpable. The right seminal vesicle is nonpalpable. The perineum is normal on inspection.  Genitourinary: Examination of the penis demonstrates no discharge, no masses, no lesions and a normal meatus. The penis is uncircumcised. The scrotum is without lesions. The right epididymis is palpably normal and non-tender. The left epididymis is palpably normal and non-tender. The right testis is non-tender and without masses. The left testis is non-tender and without masses.  Lymphatics: The femoral and inguinal nodes are not enlarged or tender.    Results/Data Urine [Data Includes: Last 1 Day]   31Oct2016  COLOR YELLOW   APPEARANCE CLEAR   SPECIFIC GRAVITY 1.020   pH 6.5   GLUCOSE NEGATIVE   BILIRUBIN NEGATIVE   KETONE NEGATIVE   BLOOD TRACE   PROTEIN TRACE   NITRITE NEGATIVE   LEUKOCYTE ESTERASE NEGATIVE   SQUAMOUS EPITHELIAL/HPF NONE SEEN HPF  WBC 0-5 WBC/HPF  RBC 3-10 RBC/HPF  BACTERIA NONE SEEN HPF  CRYSTALS NONE SEEN HPF  CASTS NONE SEEN LPF  Yeast NONE SEEN HPF   The following clinical lab reports were reviewed:  UA and PSA reviewed. PSA Flowsheet [Data Includes: Last 1 Year]   Goal 07Sep2016 [id number] 13Apr2016 [id number]  Free PSA       % Free PSA       PSA  37.68 ng/mL 32.10 ng/mL 30.99 ng/mL 21.65 ng/mL  Assessment Assessed  1. Prostate cancer (C61) 2.  Elevated prostate specific antigen (PSA) (R97.20) 3. Dysuria (R30.0) 4. Microscopic hematuria (R31.29)  His PSA has continued to rise but his exam remains benign and he has no worrisome symptoms other than initial dysuria that he feels at the bladder neck.  He has minimal hematuria.   He had a negative CT pelvis earlier this year but couldn't have an MRI or bonescan because of claustraphobia.   Plan Health Maintenance  1. UA With REFLEX; [Do Not Release]; Status:Resulted - Requires Verification;   Done:  31Oct2016 02:51PM Prostate cancer  2. Follow-up Schedule Surgery Office  Follow-up  Status: Hold For - Appointment   Requested for: 31Oct2016  With the doubling of the PSA over the past year and the history of prostate cancer he needs a repeat biopsy.  I am going to get him set up for prostate TRUS and biopsy along with cystoscopy and TUR biopsy of the prostate.  I have reviewed the risks in detail.   Discussion/Summary CC: Dr. [name].

## 2015-05-12 ENCOUNTER — Ambulatory Visit (HOSPITAL_BASED_OUTPATIENT_CLINIC_OR_DEPARTMENT_OTHER)
Admission: RE | Admit: 2015-05-12 | Discharge: 2015-05-12 | Disposition: A | Discharge status: Home / Self Care | Payer: Medicare Other | Source: Ambulatory Visit | Attending: Urology | Admitting: Urology

## 2015-05-12 ENCOUNTER — Encounter (HOSPITAL_BASED_OUTPATIENT_CLINIC_OR_DEPARTMENT_OTHER)
Admission: RE | Disposition: A | Discharge status: Home / Self Care | Payer: Self-pay | Source: Ambulatory Visit | Attending: Urology

## 2015-05-12 ENCOUNTER — Ambulatory Visit (HOSPITAL_BASED_OUTPATIENT_CLINIC_OR_DEPARTMENT_OTHER): Payer: Medicare Other | Admitting: Anesthesiology

## 2015-05-12 ENCOUNTER — Encounter (HOSPITAL_BASED_OUTPATIENT_CLINIC_OR_DEPARTMENT_OTHER): Payer: Self-pay | Admitting: Anesthesiology

## 2015-05-12 ENCOUNTER — Other Ambulatory Visit: Payer: Self-pay

## 2015-05-12 DIAGNOSIS — I10 Essential (primary) hypertension: Secondary | ICD-10-CM | POA: Insufficient documentation

## 2015-05-12 DIAGNOSIS — R9721 Rising PSA following treatment for malignant neoplasm of prostate: Secondary | ICD-10-CM | POA: Diagnosis not present

## 2015-05-12 DIAGNOSIS — R972 Elevated prostate specific antigen [PSA]: Secondary | ICD-10-CM | POA: Diagnosis present

## 2015-05-12 DIAGNOSIS — G4730 Sleep apnea, unspecified: Secondary | ICD-10-CM | POA: Insufficient documentation

## 2015-05-12 DIAGNOSIS — G709 Myoneural disorder, unspecified: Secondary | ICD-10-CM | POA: Insufficient documentation

## 2015-05-12 DIAGNOSIS — Z7982 Long term (current) use of aspirin: Secondary | ICD-10-CM | POA: Insufficient documentation

## 2015-05-12 DIAGNOSIS — E669 Obesity, unspecified: Secondary | ICD-10-CM | POA: Diagnosis not present

## 2015-05-12 DIAGNOSIS — M109 Gout, unspecified: Secondary | ICD-10-CM | POA: Insufficient documentation

## 2015-05-12 DIAGNOSIS — Z8042 Family history of malignant neoplasm of prostate: Secondary | ICD-10-CM | POA: Diagnosis not present

## 2015-05-12 DIAGNOSIS — Z85118 Personal history of other malignant neoplasm of bronchus and lung: Secondary | ICD-10-CM | POA: Insufficient documentation

## 2015-05-12 DIAGNOSIS — R300 Dysuria: Secondary | ICD-10-CM | POA: Diagnosis not present

## 2015-05-12 DIAGNOSIS — E7800 Pure hypercholesterolemia, unspecified: Secondary | ICD-10-CM | POA: Insufficient documentation

## 2015-05-12 DIAGNOSIS — C61 Malignant neoplasm of prostate: Secondary | ICD-10-CM | POA: Insufficient documentation

## 2015-05-12 DIAGNOSIS — M1990 Unspecified osteoarthritis, unspecified site: Secondary | ICD-10-CM | POA: Insufficient documentation

## 2015-05-12 DIAGNOSIS — R319 Hematuria, unspecified: Secondary | ICD-10-CM | POA: Diagnosis not present

## 2015-05-12 DIAGNOSIS — Z79899 Other long term (current) drug therapy: Secondary | ICD-10-CM | POA: Insufficient documentation

## 2015-05-12 DIAGNOSIS — N401 Enlarged prostate with lower urinary tract symptoms: Secondary | ICD-10-CM | POA: Insufficient documentation

## 2015-05-12 DIAGNOSIS — R3915 Urgency of urination: Secondary | ICD-10-CM | POA: Diagnosis not present

## 2015-05-12 DIAGNOSIS — N3021 Other chronic cystitis with hematuria: Secondary | ICD-10-CM | POA: Diagnosis not present

## 2015-05-12 DIAGNOSIS — N138 Other obstructive and reflux uropathy: Secondary | ICD-10-CM | POA: Diagnosis not present

## 2015-05-12 DIAGNOSIS — R351 Nocturia: Secondary | ICD-10-CM | POA: Insufficient documentation

## 2015-05-12 DIAGNOSIS — Z6833 Body mass index (BMI) 33.0-33.9, adult: Secondary | ICD-10-CM | POA: Insufficient documentation

## 2015-05-12 DIAGNOSIS — Z87891 Personal history of nicotine dependence: Secondary | ICD-10-CM | POA: Diagnosis not present

## 2015-05-12 DIAGNOSIS — J449 Chronic obstructive pulmonary disease, unspecified: Secondary | ICD-10-CM | POA: Diagnosis not present

## 2015-05-12 DIAGNOSIS — D494 Neoplasm of unspecified behavior of bladder: Secondary | ICD-10-CM | POA: Diagnosis not present

## 2015-05-12 HISTORY — PX: TRANSURETHRAL RESECTION OF PROSTATE: SHX73

## 2015-05-12 HISTORY — DX: Hematuria, unspecified: R31.9

## 2015-05-12 HISTORY — PX: CYSTOSCOPY WITH BIOPSY: SHX5122

## 2015-05-12 HISTORY — DX: Prediabetes: R73.03

## 2015-05-12 HISTORY — DX: Personal history of other specified conditions: Z87.898

## 2015-05-12 HISTORY — DX: Unspecified symptoms and signs involving the genitourinary system: R39.9

## 2015-05-12 HISTORY — DX: Elevated prostate specific antigen (PSA): R97.20

## 2015-05-12 LAB — POCT I-STAT, CHEM 8
BUN: 16 mg/dL (ref 6–20)
CREATININE: 1 mg/dL (ref 0.61–1.24)
Calcium, Ion: 1.27 mmol/L (ref 1.13–1.30)
Chloride: 103 mmol/L (ref 101–111)
Glucose, Bld: 95 mg/dL (ref 65–99)
HEMATOCRIT: 45 % (ref 39.0–52.0)
HEMOGLOBIN: 15.3 g/dL (ref 13.0–17.0)
POTASSIUM: 4.1 mmol/L (ref 3.5–5.1)
Sodium: 141 mmol/L (ref 135–145)
TCO2: 25 mmol/L (ref 0–100)

## 2015-05-12 LAB — GLUCOSE, CAPILLARY: Glucose-Capillary: 91 mg/dL (ref 65–99)

## 2015-05-12 SURGERY — CYSTOSCOPY, WITH BIOPSY
Anesthesia: General

## 2015-05-12 MED ORDER — SODIUM CHLORIDE 0.9 % IV SOLN
250.0000 mL | INTRAVENOUS | Status: DC | PRN
  Filled 2015-05-12: qty 250

## 2015-05-12 MED ORDER — TRAMADOL HCL 50 MG PO TABS: 50.0000 mg | ORAL_TABLET | Freq: Four times a day (QID) | ORAL | Status: DC | PRN

## 2015-05-12 MED ORDER — LIDOCAINE HCL (CARDIAC) 20 MG/ML IV SOLN
INTRAVENOUS | Status: AC
  Filled 2015-05-12: qty 5

## 2015-05-12 MED ORDER — EPHEDRINE SULFATE 50 MG/ML IJ SOLN
INTRAMUSCULAR | Status: AC
  Filled 2015-05-12: qty 1

## 2015-05-12 MED ORDER — LACTATED RINGERS IV SOLN
INTRAVENOUS | Status: DC
  Administered 2015-05-12 (×2): via INTRAVENOUS
  Filled 2015-05-12: qty 1000

## 2015-05-12 MED ORDER — DEXTROSE 5 % IV SOLN
2.0000 g | INTRAVENOUS | Status: AC
  Administered 2015-05-12: 2 g via INTRAVENOUS
  Filled 2015-05-12: qty 2

## 2015-05-12 MED ORDER — GENTAMICIN SULFATE 40 MG/ML IJ SOLN
5.0000 mg/kg | INTRAVENOUS | Status: DC
  Filled 2015-05-12: qty 13

## 2015-05-12 MED ORDER — DEXTROSE 5 % IV SOLN
5.0000 mg/kg | Freq: Once | INTRAVENOUS | Status: AC
  Administered 2015-05-12: 410 mg via INTRAVENOUS
  Filled 2015-05-12 (×2): qty 10.25

## 2015-05-12 MED ORDER — DEXTROSE 5 % IV SOLN
INTRAVENOUS | Status: AC
  Filled 2015-05-12: qty 50

## 2015-05-12 MED ORDER — FENTANYL CITRATE (PF) 100 MCG/2ML IJ SOLN
25.0000 ug | INTRAMUSCULAR | Status: DC | PRN
  Filled 2015-05-12: qty 1

## 2015-05-12 MED ORDER — FENTANYL CITRATE (PF) 100 MCG/2ML IJ SOLN
INTRAMUSCULAR | Status: AC
  Filled 2015-05-12: qty 4

## 2015-05-12 MED ORDER — CEFTRIAXONE SODIUM 2 G IJ SOLR
INTRAMUSCULAR | Status: AC
  Filled 2015-05-12: qty 2

## 2015-05-12 MED ORDER — ACETAMINOPHEN 650 MG RE SUPP
650.0000 mg | RECTAL | Status: DC | PRN
  Filled 2015-05-12: qty 1

## 2015-05-12 MED ORDER — ACETAMINOPHEN 10 MG/ML IV SOLN
INTRAVENOUS | Status: DC | PRN
  Administered 2015-05-12: 1000 mg via INTRAVENOUS

## 2015-05-12 MED ORDER — EPHEDRINE SULFATE 50 MG/ML IJ SOLN
INTRAMUSCULAR | Status: DC | PRN
  Administered 2015-05-12 (×2): 10 mg via INTRAVENOUS

## 2015-05-12 MED ORDER — FENTANYL CITRATE (PF) 100 MCG/2ML IJ SOLN
INTRAMUSCULAR | Status: DC | PRN
  Administered 2015-05-12 (×8): 12.5 ug via INTRAVENOUS

## 2015-05-12 MED ORDER — ONDANSETRON HCL 4 MG/2ML IJ SOLN
INTRAMUSCULAR | Status: AC
  Filled 2015-05-12: qty 2

## 2015-05-12 MED ORDER — TRAMADOL HCL 50 MG PO TABS
ORAL_TABLET | ORAL | Status: AC
  Filled 2015-05-12: qty 1

## 2015-05-12 MED ORDER — TRAMADOL HCL 50 MG PO TABS
50.0000 mg | ORAL_TABLET | Freq: Four times a day (QID) | ORAL | Status: DC | PRN
  Administered 2015-05-12: 50 mg via ORAL
  Filled 2015-05-12: qty 1

## 2015-05-12 MED ORDER — LIDOCAINE HCL (CARDIAC) 20 MG/ML IV SOLN
INTRAVENOUS | Status: DC | PRN
  Administered 2015-05-12: 60 mg via INTRAVENOUS

## 2015-05-12 MED ORDER — OXYCODONE HCL 5 MG PO TABS
5.0000 mg | ORAL_TABLET | ORAL | Status: DC | PRN
  Filled 2015-05-12: qty 2

## 2015-05-12 MED ORDER — ACETAMINOPHEN 325 MG PO TABS
650.0000 mg | ORAL_TABLET | ORAL | Status: DC | PRN
Start: 2015-05-12 — End: 2015-05-12
  Filled 2015-05-12: qty 2

## 2015-05-12 MED ORDER — SODIUM CHLORIDE 0.9 % IJ SOLN
3.0000 mL | INTRAMUSCULAR | Status: DC | PRN
  Filled 2015-05-12: qty 3

## 2015-05-12 MED ORDER — ONDANSETRON HCL 4 MG/2ML IJ SOLN
INTRAMUSCULAR | Status: DC | PRN
  Administered 2015-05-12: 4 mg via INTRAVENOUS

## 2015-05-12 MED ORDER — SODIUM CHLORIDE 0.9 % IJ SOLN
3.0000 mL | Freq: Two times a day (BID) | INTRAMUSCULAR | Status: DC
  Filled 2015-05-12: qty 3

## 2015-05-12 MED ORDER — PROPOFOL 10 MG/ML IV BOLUS
INTRAVENOUS | Status: DC | PRN
  Administered 2015-05-12: 140 mg via INTRAVENOUS

## 2015-05-12 MED ORDER — FLEET ENEMA 7-19 GM/118ML RE ENEM
1.0000 | ENEMA | Freq: Once | RECTAL | Status: DC
  Filled 2015-05-12: qty 1

## 2015-05-12 MED ORDER — PROPOFOL 10 MG/ML IV BOLUS
INTRAVENOUS | Status: AC
  Filled 2015-05-12: qty 20

## 2015-05-12 SURGICAL SUPPLY — 38 items
BAG DRAIN URO-CYSTO SKYTR STRL (DRAIN) ×3 IMPLANT
BAG DRN ANRFLXCHMBR STRAP LEK (BAG)
BAG DRN UROCATH (DRAIN) ×1
BAG URINE DRAINAGE (UROLOGICAL SUPPLIES) IMPLANT
BAG URINE LEG 19OZ MD ST LTX (BAG) IMPLANT
CANISTER SUCT LVC 12 LTR MEDI- (MISCELLANEOUS) IMPLANT
CATH FOLEY 2WAY SLVR  5CC 16FR (CATHETERS)
CATH FOLEY 2WAY SLVR  5CC 20FR (CATHETERS)
CATH FOLEY 2WAY SLVR  5CC 22FR (CATHETERS)
CATH FOLEY 2WAY SLVR 30CC 20FR (CATHETERS) ×3 IMPLANT
CATH FOLEY 2WAY SLVR 5CC 16FR (CATHETERS) IMPLANT
CATH FOLEY 2WAY SLVR 5CC 20FR (CATHETERS) IMPLANT
CATH FOLEY 2WAY SLVR 5CC 22FR (CATHETERS) IMPLANT
CATH HEMA 3WAY 30CC 24FR COUDE (CATHETERS) IMPLANT
CATH HEMA 3WAY 30CC 24FR RND (CATHETERS) IMPLANT
CLOTH BEACON ORANGE TIMEOUT ST (SAFETY) ×3 IMPLANT
ELECT LOOP MED HF 24F 12D (CUTTING LOOP) ×3 IMPLANT
ELECT REM PT RETURN 9FT ADLT (ELECTROSURGICAL) ×3
ELECTRODE REM PT RTRN 9FT ADLT (ELECTROSURGICAL) ×1 IMPLANT
GLOVE SURG SS PI 8.0 STRL IVOR (GLOVE) ×3 IMPLANT
GOWN STRL REUS W/ TWL LRG LVL3 (GOWN DISPOSABLE) ×1 IMPLANT
GOWN STRL REUS W/ TWL XL LVL3 (GOWN DISPOSABLE) ×1 IMPLANT
GOWN STRL REUS W/TWL LRG LVL3 (GOWN DISPOSABLE) ×3
GOWN STRL REUS W/TWL XL LVL3 (GOWN DISPOSABLE) ×3
HOLDER FOLEY CATH W/STRAP (MISCELLANEOUS) IMPLANT
KIT ROOM TURNOVER WOR (KITS) ×3 IMPLANT
MANIFOLD NEPTUNE II (INSTRUMENTS) IMPLANT
NDL SAFETY ECLIPSE 18X1.5 (NEEDLE) IMPLANT
NEEDLE HYPO 18GX1.5 SHARP (NEEDLE)
NEEDLE HYPO 22GX1.5 SAFETY (NEEDLE) IMPLANT
NS IRRIG 500ML POUR BTL (IV SOLUTION) IMPLANT
PACK CYSTO (CUSTOM PROCEDURE TRAY) ×3 IMPLANT
PLUG CATH AND CAP STER (CATHETERS) IMPLANT
SET ASPIRATION TUBING (TUBING) IMPLANT
SYR 20CC LL (SYRINGE) IMPLANT
TUBE CONNECTING 12'X1/4 (SUCTIONS)
TUBE CONNECTING 12X1/4 (SUCTIONS) IMPLANT
WATER STERILE IRR 3000ML UROMA (IV SOLUTION) ×3 IMPLANT

## 2015-05-12 NOTE — Brief Op Note (Signed)
05/12/2015  11:12 AM  PATIENT:  Jonathan Medina  81 y.o. male  PRE-OPERATIVE DIAGNOSIS:  INCREASE PSA PROSTATE CANCER, DYSURIA WITH HEMATURA  POST-OPERATIVE DIAGNOSIS:  INCREASE PSA PROSTATE CANCER, DYSURIA WITH HEMATURIA and BLADDER LESION.  PROCEDURE:  Procedure(s): CYSTOSCOPY WITH ULTRASOUND AND  BIOPSY OF PROSTATE (N/A) TRANSURETHRAL RESECTION OF THE PROSTATE (TURP) BIOPSY (N/A)  BLADDER BIOPSY WITH FULGURATION  SURGEON:  Surgeon(s) and Role:    * Irine Seal, MD - Primary  PHYSICIAN ASSISTANT:   ASSISTANTS: none   ANESTHESIA:   general  EBL:  Total I/O In: 200 [I.V.:200] Out: -   BLOOD ADMINISTERED:none  DRAINS: Urinary Catheter (Foley)   LOCAL MEDICATIONS USED:  NONE  SPECIMEN:  Source of Specimen:  13 prostate cores and TUR prostate biopsies.  Bladder biopsy.  DISPOSITION OF SPECIMEN:  PATHOLOGY  COUNTS:  YES  TOURNIQUET:  * No tourniquets in log *  DICTATION: .Other Dictation: Dictation Number [id number]  PLAN OF CARE: Discharge to home after PACU  PATIENT DISPOSITION:  PACU - hemodynamically stable.   Delay start of Pharmacological VTE agent (>24hrs) due to surgical blood loss or risk of bleeding: yes

## 2015-05-12 NOTE — Discharge Instructions (Addendum)
CYSTOSCOPY HOME CARE INSTRUCTIONS  Activity: Rest for the remainder of the day.  Do not drive or operate equipment today.  You may resume normal activities in one to two days as instructed by your physician.   Meals: Drink plenty of liquids and eat light foods such as gelatin or soup this evening.  You may return to a normal meal plan tomorrow.  Return to Work: You may return to work in one to two days or as instructed by your physician.  Special Instructions / Symptoms: Call your physician if any of these symptoms occur:   -persistent or heavy bleeding  -bleeding which continues after first few urination  -large blood clots that are difficult to pass  -urine stream diminishes or stops completely  -fever equal to or higher than 101 degrees Farenheit.  -cloudy urine with a strong, foul odor  -severe pain  Females should always wipe from front to back after elimination.  You may feel some burning pain when you urinate.  This should disappear with time.  Applying moist heat to the lower abdomen or a hot tub bath may help relieve the pain. \  You may remove the catheter in the morning by cutting the side arm.  The catheter should slide out easily.   You may resume the aspirin in a week.   Patient Signature:  ________________________________________________________  Nurse's Signature:  ________________________________________________________    Post Anesthesia Home Care Instructions  Activity: Get plenty of rest for the remainder of the day. A responsible adult should stay with you for 24 hours following the procedure.  For the next 24 hours, DO NOT: -Drive a car -Paediatric nurse -Drink alcoholic beverages -Take any medication unless instructed by your physician -Make any legal decisions or sign important papers.  Meals: Start with liquid foods such as gelatin or soup. Progress to regular foods as tolerated. Avoid greasy, spicy, heavy foods. If nausea and/or vomiting occur,  drink only clear liquids until the nausea and/or vomiting subsides. Call your physician if vomiting continues.  Special Instructions/Symptoms: Your throat may feel dry or sore from the anesthesia or the breathing tube placed in your throat during surgery. If this causes discomfort, gargle with warm salt water. The discomfort should disappear within 24 hours.  If you had a scopolamine patch placed behind your ear for the management of post- operative nausea and/or vomiting:  1. The medication in the patch is effective for 72 hours, after which it should be removed.  Wrap patch in a tissue and discard in the trash. Wash hands thoroughly with soap and water. 2. You may remove the patch earlier than 72 hours if you experience unpleasant side effects which may include dry mouth, dizziness or visual disturbances. 3. Avoid touching the patch. Wash your hands with soap and water after contact with the patch.

## 2015-05-12 NOTE — Interval H&P Note (Signed)
History and Physical Interval Note:  05/12/2015 9:35 AM  Jonathan Medina  has presented today for surgery, with the diagnosis of INCREASE PSA PROSTATE CANCER, DYSURIA WITH HEMATURA  The various methods of treatment have been discussed with the patient and family. After consideration of risks, benefits and other options for treatment, the patient has consented to  Procedure(s): CYSTOSCOPY WITH ULTRASOUND AND  BIOPSY OF PROSTATE (N/A) TRANSURETHRAL RESECTION OF THE PROSTATE (TURP) BIOPSY (N/A) as a surgical intervention .  The patient's history has been reviewed, patient examined, no change in status, stable for surgery.  I have reviewed the patient's chart and labs.  Questions were answered to the patient's satisfaction.     [name]

## 2015-05-12 NOTE — Anesthesia Procedure Notes (Addendum)
Procedure Name: LMA Insertion Date/Time: 05/12/2015 10:35 AM Performed by: Justice Rocher Pre-anesthesia Checklist: Patient identified, Emergency Drugs available, Suction available and Patient being monitored Patient Re-evaluated:Patient Re-evaluated prior to inductionOxygen Delivery Method: Circle System Utilized Preoxygenation: Pre-oxygenation with 100% oxygen Intubation Type: IV induction Ventilation: Mask ventilation without difficulty LMA: LMA inserted LMA Size: 4.0 Number of attempts: 1 Airway Equipment and Method: Bite block Placement Confirmation: positive ETCO2 Tube secured with: Tape Dental Injury: Teeth and Oropharynx as per pre-operative assessment

## 2015-05-12 NOTE — Anesthesia Preprocedure Evaluation (Addendum)
Anesthesia Evaluation  Patient identified by MRN, date of birth, ID band Patient awake    Reviewed: Allergy & Precautions, NPO status , Patient's Chart, lab work & pertinent test results  Airway Mallampati: II  TM Distance: >3 FB Neck ROM: Full    Dental no notable dental hx.    Pulmonary sleep apnea and Continuous Positive Airway Pressure Ventilation , COPD, former smoker,    Pulmonary exam normal breath sounds clear to auscultation       Cardiovascular Exercise Tolerance: Good hypertension, Pt. on medications Normal cardiovascular exam Rhythm:Regular Rate:Normal     Neuro/Psych  Neuromuscular disease negative psych ROS   GI/Hepatic negative GI ROS, Neg liver ROS,   Endo/Other  negative endocrine ROS  Renal/GU negative Renal ROS  negative genitourinary   Musculoskeletal  (+) Arthritis ,   Abdominal (+) + obese,   Peds negative pediatric ROS (+)  Hematology  (+) anemia ,   Anesthesia Other Findings   Reproductive/Obstetrics negative OB ROS                             Anesthesia Physical Anesthesia Plan  ASA: III  Anesthesia Plan: General   Post-op Pain Management:    Induction: Intravenous  Airway Management Planned: LMA  Additional Equipment:   Intra-op Plan:   Post-operative Plan: Extubation in OR  Informed Consent: I have reviewed the patients History and Physical, chart, labs and discussed the procedure including the risks, benefits and alternatives for the proposed anesthesia with the patient or authorized representative who has indicated his/her understanding and acceptance.   Dental advisory given  Plan Discussed with: CRNA  Anesthesia Plan Comments:         Anesthesia Quick Evaluation

## 2015-05-12 NOTE — Transfer of Care (Signed)
Immediate Anesthesia Transfer of Care Note  Immediate Anesthesia Transfer of Care Note  Patient: Jonathan Medina  Procedure(s) Performed: Procedure(s) (LRB): CYSTOSCOPY WITH ULTRASOUND AND  BIOPSY OF PROSTATE (N/A) TRANSURETHRAL RESECTION OF THE PROSTATE (TURP) BIOPSY (N/A)  Patient Location: PACU  Anesthesia Type: General  Level of Consciousness: awake, sedated, patient cooperative and responds to stimulation  Airway & Oxygen Therapy: Patient Spontanous Breathing and Patient connected to face mask oxygen  Post-op Assessment: Report given to PACU RN, Post -op Vital signs reviewed and stable and Patient moving all extremities  Post vital signs: Reviewed and stable  Complications: No apparent anesthesia complications

## 2015-05-12 NOTE — Anesthesia Postprocedure Evaluation (Signed)
  Anesthesia Post-op Note  Patient: Jonathan Medina  Procedure(s) Performed: Procedure(s) (LRB): CYSTOSCOPY WITH ULTRASOUND AND  BIOPSY OF PROSTATE (N/A) TRANSURETHRAL RESECTION OF THE PROSTATE (TURP) BIOPSY (N/A)  Patient Location: PACU  Anesthesia Type: General  Level of Consciousness: awake and alert   Airway and Oxygen Therapy: Patient Spontanous Breathing  Post-op Pain: mild  Post-op Assessment: Post-op Vital signs reviewed, Patient's Cardiovascular Status Stable, Respiratory Function Stable, Patent Airway and No signs of Nausea or vomiting  Last Vitals:  Filed Vitals:   05/12/15 1145  BP: 107/56  Pulse: 58  Temp:   Resp: 14    Post-op Vital Signs: stable   Complications: No apparent anesthesia complications

## 2015-05-13 ENCOUNTER — Encounter (HOSPITAL_BASED_OUTPATIENT_CLINIC_OR_DEPARTMENT_OTHER): Payer: Self-pay | Admitting: Urology

## 2015-05-13 NOTE — Op Note (Signed)
NAMEJAHI, [name] NO.:  [id number]  MEDICAL RECORD NO.:  [id number]  LOCATION:                               FACILITY:  [facility]  PHYSICIAN:  Marshall Cork. Jeffie Pollock, M.D.    DATE OF BIRTH:  1929-07-20  DATE OF PROCEDURE:  05/12/2015 DATE OF DISCHARGE:  05/12/2015                              OPERATIVE REPORT   PROCEDURE: 1. Transrectal ultrasound of the prostate and ultrasound-guided     prostate biopsy. 2. Cystoscopy with bladder biopsy and fulguration of 2-cm lesion. 3. Transurethral resection and prostate biopsy.  PREOPERATIVE DIAGNOSES:  History of prostate cancer with rising PSA and irritative voiding symptoms.  POSTOPERATIVE DIAGNOSES:  History of prostate cancer with rising PSA and irritative voiding symptoms.  SURGEON:  Marshall Cork. Jeffie Pollock, M.D.  ANESTHESIA:  General.  SPECIMENS:  Thirteen prostate biopsy cores in 12 bottles, 2 TUR prostate chips, and a single biopsy from the right bladder wall.  BLOOD LOSS:  Minimal.  DRAINS:  A 22-French Foley catheter.  COMPLICATIONS:  None.  INDICATIONS:  Jonathan Medina is an 81 year old white male with a history of Gleason 7 prostate cancer on a single biopsy core in 2014.  Jonathan Medina elected surveillance, but his PSA has been rising.  Subsequent biopsy in 2015 was negative, but his PSA had gone up to 38 this year.  CT of the pelvis revealed no worrisome lesions.  Jonathan Medina was unable to have an MRI or a bone scan due to claustrophobia.  Jonathan Medina is to undergo repeat prostate biopsy transrectally and via TUR.  Jonathan Medina also has a history of some microhematuria with irritative voiding symptoms with prior bladder biopsy that showed inflammatory changes.  FINDINGS AND PROCEDURE:  Jonathan Medina was taken to the operating room where general anesthetic was induced.  Jonathan Medina was given 2 g of Rocephin and gentamicin per pharmacy dosing.  Jonathan Medina was placed in lithotomy position and was fitted with PAS hose.  Jonathan Medina then underwent transrectal ultrasound using the 10 MHz  probe. Inspection revealed normal seminal vesicles.  The prostate had a volume of 73.24 mL with a length of 5.8 cm, height of 4.58 cm, and a width of 5.27 cm.  The prostate imaging demonstrated some increased hypoechogenicity of the peripheral zone particularly in the mid and apical prostate with the greatest thickness in the midline of the mid prostate.  There was an enlarged transitional zone with a few scattered calcifications, but no other abnormalities were noted.  There was no evidence of extracapsular extension.  Once the initial scan was performed, the biopsies were obtained in the standard 12-core distribution with the 13th core obtained in the right medial area placed in the same jar.  Once the biopsies were obtained, the ultrasound probe was removed. There was some minor bleeding.  Gauze was placed in the anus to absorb the blood during the next portion of procedure.  Jonathan Medina was then prepped with Hibiclens and draped in usual sterile fashion. A 28-French continuous flow resectoscope sheath was inserted using the visual obturator.  Inspection revealed a normal urethra.  The external sphincter was intact.  The prostatic urethra was approximately 3 cm in length with trilobar hyperplasia and a small  middle lobe.  There was obstruction.  Examination of the bladder revealed mild trabeculation. The ureteral orifices were in the normal anatomic position and there was patchy erythema on the right lateral wall that was most consistent with inflammatory changes.  It bled easily with filling and emptying of the bladder.  While this area had been inflammatory on biopsy before, I elected to take another biopsy, this was done using the bipolar loop.  A single chip was obtained and then the surrounding area was fulgurated. Approximately 2 to 3 cm of the lesion was fulgurated to try to control hemostasis.  At this point, that specimen was retrieved from the bladder and I then took a deep  TUR biopsy from the right and left lateral lobes of the prostate.  Once these biopsies were obtained, hemostasis was achieved, and the biopsies were passed off to be sent in a single bottle.  At this point, final inspection revealed no active bleeding.  There was a TUR defect.  It was actually fairly generous from the 2 cores.  The scope was removed.  Pressure on the bladder produced a good stream.  A 22-French Foley catheter was inserted.  The balloon was filled with 10 mL of sterile fluid.  The catheter drained clear irrigant.  At this point, the gauze was removed from the anus and finger inspection was performed.  No active bleeding was noted.  Jonathan Medina was taken down from lithotomy position.  His anesthetic was reversed.  Jonathan Medina was moved to the recovery room in stable condition.  There were no complications.     Marshall Cork. Jeffie Pollock, M.D.     JJW/MEDQ  D:  05/12/2015  T:  05/12/2015  Job:  [id number]

## 2015-05-19 ENCOUNTER — Other Ambulatory Visit: Payer: Self-pay | Admitting: Urology

## 2015-05-19 DIAGNOSIS — R350 Frequency of micturition: Secondary | ICD-10-CM | POA: Diagnosis not present

## 2015-05-19 DIAGNOSIS — C61 Malignant neoplasm of prostate: Secondary | ICD-10-CM

## 2015-06-01 ENCOUNTER — Encounter: Payer: Self-pay | Admitting: Radiation Oncology

## 2015-06-01 NOTE — Progress Notes (Signed)
GU Location of Tumor / Histology: prostate cancer  If Prostate Cancer, Gleason Score is7(3+4) in 2014 and PSA is (37.8) and prostate volume was 42m.  MStoney Bangpresented with a rising PSA.  Biopsies revealed:   05/12/15 Diagnosis 1. Prostate, needle biopsy(ies), right base lateral - PROSTATIC ADENOCARCINOMA, GLEASON SCORE 4+3=7, INVOLVING 5% OF TISSUE IN 1 OF 1 CORE. 2. Prostate, needle biopsy(ies), right base medial - PROSTATIC ADENOCARCINOMA, GLEASON SCORE 4+3=7, INVOLVING 40% OF TISSUE IN 1 OF 1 CORE. 3. Prostate, needle biopsy(ies), right mid lateral - PROSTATIC ADENOCARCINOMA, GLEASON SCORE 4+3=7, INVOLVING 40% OF TISSUE IN 1 OF 1 CORE. 4. Prostate, needle biopsy(ies), right mid medial - PROSTATIC ADENOCARCINOMA, GLEASON SCORE 4+3=7, INVOLVING <5% OF TISSUE IN 1 OF 2 CORES. 5. Prostate, needle biopsy(ies), right apex lateral - PROSTATIC ADENOCARCINOMA, GLEASON SCORE 4+4=8, INVOLVING <5% OF TISSUE IN 1 OF 1 CORE. 6. Prostate, needle biopsy(ies), right apex medial - PROSTATIC ADENOCARCINOMA, GLEASON SCORE 4+3=7, INVOLVING 50% OF TISSUE IN 1 OF 1 CORE. 7. Prostate, needle biopsy(ies), left base lateral - BENIGN PROSTATIC TISSUE. 8. Prostate, needle biopsy(ies), left base medial - BENIGN PROSTATIC TISSUE. 9. Prostate, needle biopsy(ies), left mid lateral - BENIGN PROSTATIC TISSUE. 10. Prostate, needle biopsy(ies), left mid medial - BENIGN PROSTATIC TISSUE. 11. Prostate, needle biopsy(ies), left apex lateral - PROSTATIC ADENOCARCINOMA, GLEASON SCORE 3+3=6, INVOLVING <5% OF TISSUE IN 1 OF 1 CORE. 12. Prostate, needle biopsy(ies), left apex medial - BENIGN PROSTATIC TISSUE. 13. Bladder, biopsy - LARGELY DENUDED UROTHELIUM WITH CHRONIC INFLAMMATION. - NO MALIGNANCY IDENTIFIED. 14. Prostate, chips - PROSTATIC ADENOCARCINOMA, GLEASON SCORE 3+4=7, INVOLVING <5% OF TISSUE. - PERINEURAL INVASION PRESENT. JULIA  03/20/14 Diagnosis 1. Prostate, needle biopsy(ies), base lateral  right - BENIGN PROSTATIC TISSUE. - THERE IS NO EVIDENCE OF MALIGNANCY. 2. Prostate, needle biopsy(ies), base medial right - SINGLE ATYPICAL GLAND. - SEE COMMENT. 3. Prostate, needle biopsy(ies), mid lateral right - BENIGN PROSTATIC TISSUE. - THERE IS NO EVIDENCE OF MALIGNANCY. 4. Prostate, needle biopsy(ies), mid medial right - BENIGN PROSTATIC TISSUE. - THERE IS NO EVIDENCE OF MALIGNANCY. - SEE COMMENT. 5. Prostate, needle biopsy(ies), apex lateral right - BENIGN PROSTATIC TISSUE. - THERE IS NO EVIDENCE OF MALIGNANCY. 6. Prostate, needle biopsy(ies), apex medial right - BENIGN PROSTATIC TISSUE. - THERE IS NO EVIDENCE OF MALIGNANCY. 7. Prostate, needle biopsy(ies), base lateral left - BENIGN PROSTATIC TISSUE. - THERE IS NO EVIDENCE OF MALIGNANCY. 8. Prostate, needle biopsy(ies), base medial left - BENIGN PROSTATIC TISSUE. - THERE IS NO EVIDENCE OF MALIGNANCY. - SEE COMMENT. 9. Prostate, needle biopsy(ies), mid lateral left - BENIGN PROSTATIC TISSUE. - THERE IS NO EVIDENCE OF MALIGNANCY. 10. Prostate, needle biopsy(ies), mid medial left - BENIGN PROSTATIC TISSUE. - THERE IS NO EVIDENCE OF MALIGNANCY. 11. Prostate, needle biopsy(ies), apex lateral left - BENIGN PROSTATIC TISSUE. - THERE IS NO EVIDENCE OF MALIGNANCY. 12. Prostate, needle biopsy(ies), apex medial left - BENIGN PROSTATIC TISSUE. - THERE IS NO EVIDENCE OF MALIGNANCY. 13. Bladder, biopsy, right lateral wall - LARGELY DENUDED AND INFLAMED UROTHELIUM. - THERE IS NO EVIDENCE OF MALIGNANCY. - SEE COMMENT. 14. Bladder, biopsy, bladder dome - LARGELY DENUDED AND INFLAMED UROTHELIUM. - THERE IS NO EVIDENCE OF MALIGNANCY. 15. Bladder, biopsy, posterior wall - LARGELY DENUDED AND INFLAMED UROTHELIUM. - THERE IS NO EVIDENCE OF MALIGNANCY. - SEE COMMENT.  01/09/13   Past/Anticipated interventions by urology, if any: 05/12/15 - Procedure: CYSTOSCOPY WITH ULTRASOUND AND  BIOPSY OF PROSTATE;  Surgeon: JIrine Seal MD;   [facility]  Service: Urology;  And Procedure: TRANSURETHRAL RESECTION OF THE PROSTATE (TURP) BIOPSY;  Surgeon: Irine Seal, MD;  Location: [facility];  Service: Urology;  Laterality: N/A;  Past/Anticipated interventions by medical oncology, if any: no  Weight changes, if any: reports he has lost about 6 lbs.  Bowel/Bladder complaints, if any: IPSS score of 18 with intermittency, frequency, urgency and nocturia.  He denies having any bowel issues.   Nausea/Vomiting, if any: no  Pain issues, if any:  Reports he has pain in his left knee with walking.   SAFETY ISSUES:  Prior radiation? no  Pacemaker/ICD? no  Possible current pregnancy? no  Is the patient on methotrexate? no  Current Complaints / other details:  Patient is here with his wife.  He has a family history of prostate cancer in his father and grandfather.  BP 141/61 mmHg  Pulse 66  Temp(Src) 97.8 F (36.6 C) (Oral)  Resp 16  Ht '5\' 8"'$  (1.727 m)  Wt 224 lb 4.8 oz (101.742 kg)  BMI 34.11 kg/m2

## 2015-06-03 ENCOUNTER — Encounter (HOSPITAL_COMMUNITY)
Admission: RE | Admit: 2015-06-03 | Discharge: 2015-06-03 | Disposition: A | Discharge status: Home / Self Care | Payer: Medicare Other | Source: Ambulatory Visit | Attending: Urology | Admitting: Urology

## 2015-06-03 ENCOUNTER — Ambulatory Visit (HOSPITAL_COMMUNITY)
Admission: RE | Admit: 2015-06-03 | Discharge: 2015-06-03 | Disposition: A | Discharge status: Home / Self Care | Payer: Medicare Other | Source: Ambulatory Visit | Attending: Urology | Admitting: Urology

## 2015-06-03 DIAGNOSIS — C61 Malignant neoplasm of prostate: Secondary | ICD-10-CM | POA: Diagnosis not present

## 2015-06-03 MED ORDER — TECHNETIUM TC 99M MEDRONATE IV KIT
24.8000 | PACK | Freq: Once | INTRAVENOUS | Status: AC | PRN
  Administered 2015-06-03: 24.8 via INTRAVENOUS

## 2015-06-04 ENCOUNTER — Encounter: Payer: Self-pay | Admitting: Radiation Oncology

## 2015-06-04 ENCOUNTER — Ambulatory Visit
Admission: RE | Admit: 2015-06-04 | Discharge: 2015-06-04 | Disposition: A | Discharge status: Home / Self Care | Payer: Medicare Other | Source: Ambulatory Visit | Attending: Radiation Oncology | Admitting: Radiation Oncology

## 2015-06-04 VITALS — BP 141/61 | HR 66 | Temp 97.8°F | Resp 16 | Ht 68.0 in | Wt 224.3 lb

## 2015-06-04 DIAGNOSIS — C61 Malignant neoplasm of prostate: Secondary | ICD-10-CM | POA: Insufficient documentation

## 2015-06-04 DIAGNOSIS — R9721 Rising PSA following treatment for malignant neoplasm of prostate: Secondary | ICD-10-CM | POA: Diagnosis not present

## 2015-06-04 DIAGNOSIS — Z510 Encounter for antineoplastic radiation therapy: Secondary | ICD-10-CM | POA: Insufficient documentation

## 2015-06-04 NOTE — Progress Notes (Addendum)
Radiation Oncology         (336) [phone] ________________________________  Name: Jonathan Medina MRN: [id number]  Date: 06/04/2015  DOB: [birth year]  Re-Evaluation Visit Note  CC: [name], MD  [name], MD    ICD-9-CM ICD-10-CM   1. Prostate cancer ([place]) [place] referral to Social Work    Diagnosis: Stage T1c Adenocarcinoma of the Prostate, Gleason Score 8 (4+4), PSA (37.8), and prostate volume of 70m  Narrative:  The patient returns today for a re-evaluation. I previously saw the patient on 02/14/2013 who was seen out of the courtesy of Dr. JIrine Sealfor an opinion concerning radiation therapy as part of management of the patient's adenocarcinoma of the prostate. At that time, he elected for close monitoring. He also initially presented in 2011 with an elevated PSA and preferred surveillance then as well. Biopsy at that time showed high-grade prostatic intraepithelial neoplasia. His prostate volume at that time was 57 cc. In 2014, the patient's PSA had risen up to 13.5. The patient proceeded to undergo repeat biopsy in July 2014. Patient's prostate volume ultrasound was 68 cm. One core from the right mid lateral area showed Gleason 7 (3+4). This involved less than 5% of the tissue sampled.  In 2015, the patient's PSA continued to rise to about 20. The patient had a repeat prostate biopsy on 03/20/2014 showing a single atypical gland in the right medial base. By 2016, the patient's PSA had risen up to 37.8. CT of the pelvis in February revealed no worrisome lesions. He did not have a bone scan or MRI then due to claustrophobia. On 05/12/2015, the patient had another prostate biopsy with 7 out of 13 core biopsies positive for malignancy and the prostate, prostate chips was also positive for prostatic adenocarcinoma. Two core biopsies of the right mid medial was extracted with one positive for metastasis. Biopsy from the right lateral apex revealed Gleason score of 8 (4+4) The  prostate volume was 757m The patient had a bone scan on 06/03/15 noting no metastatic disease of the bone. The patient reports an IPSS score of 18 with intermittency, frequency, urgency and nocturia. He denies having any bowel issues. The patient was referred back to me by the courtesy of Dr. WrJeffie Pollockor the consideration of radiation therapy for the management of his disease.  ALLERGIES:  is allergic to epinephrine; betadine; ivp dye; nisoldipine; shellfish allergy; simvastatin; and statins.  Meds: Current Outpatient Prescriptions  Medication Sig Dispense Refill  . amLODipine (NORVASC) 10 MG tablet TAKE 1 TABLET DAILY IN THE MORNING 90 tablet 3  . Calcium Carb-Cholecalciferol (CALCIUM 1000 + D PO) Take 1 tablet by mouth daily.    . carvedilol (COREG CR) 20 MG 24 hr capsule TAKE 1 CAPSULE DAILY---   takes in am 90 capsule 0  . losartan (COZAAR) 100 MG tablet Take 1 tablet (100 mg total) by mouth every morning. 90 tablet 2  . Tamsulosin HCl (FLOMAX) 0.4 MG CAPS Take 0.4 mg by mouth daily after breakfast.     . aspirin EC 81 MG tablet Take 81 mg by mouth daily.    . traMADol (ULTRAM) 50 MG tablet Take 1 tablet (50 mg total) by mouth every 6 (six) hours as needed for moderate pain. (Patient not taking: Reported on 06/04/2015) 12 tablet 0   No current facility-administered medications for this encounter.    Physical Findings: The patient is in no acute distress. Patient is alert and oriented.  height is '5\' 8"'$  (  1.727 m) and weight is 224 lb 4.8 oz (101.742 kg). His oral temperature is 97.8 F (36.6 C). His blood pressure is 141/61 and his pulse is 66. His respiration is 16.  Lungs are clear to auscultation bilaterally. Heart has regular rate and rhythm. No palpable cervical, supraclavicular, or axillary adenopathy. Prostate exam not performed in light of recent surgery  Lab Findings: Lab Results  Component Value Date   WBC 6.7 03/24/2015   HGB 15.3 05/12/2015   HCT 45.0 05/12/2015   MCV 89.5  03/24/2015   PLT 311.0 03/24/2015    Radiographic Findings: Nm Bone Scan Whole Body  06/03/2015  CLINICAL DATA:  81 year old male with prostate cancer. Left knee injury in October. Subsequent encounter. EXAM: NUCLEAR MEDICINE WHOLE BODY BONE SCAN TECHNIQUE: Whole body anterior and posterior images were obtained approximately 3 hours after intravenous injection of radiopharmaceutical. RADIOPHARMACEUTICALS:  24.1 mCi Technetium-39mMDP IV COMPARISON:  Pelvis CT today reported separately. PET-CT 07/31/2007. No prior bone scan. FINDINGS: Expected radiotracer activity in both kidneys and the bladder. Perineum contamination. Homogeneous axial skeleton activity from the skull to the mid lumbar spine. There is symmetric mildly to moderately increased radiotracer activity in both sacral ala and the L5 posterior elements. This in part corresponds to severe lumbosacral facet hypertrophy on today's CT. Pelvis radiotracer activity otherwise within normal limits. Visualized upper extremity activity within normal limits. Lower extremity activity remarkable for moderate to severe increased activity at the medial compartment of the left knee which appears degenerative and/or posttraumatic. IMPRESSION: 1.  No findings specific for metastatic disease to bone. 2. Degenerative and/or posttraumatic activity suspected in the L5 posterior elements, sacral ala, left knee. Electronically Signed   By: HGenevie AnnM.D.   On: 06/03/2015 15:34    Impression:  High risk Stage TIc Gleason's 8 adenocarcinoma of the prostate. I discussed the management options with the patient and his wife. The patient inquired about radioactive seed implantation. Given the gland size as well as his Gleeson score,  the patient would not be a candidate for this therapy. Patient's disease is rapidly progressed within the prostate gland. Recent CT scan of abdomen and pelvis showed no evidence of metastasis and recent bone scan shows no osseous metastasis. I would  agree with Dr. WRalene Muskratrecommendations for active treatment at this time. Patient has had a recent TURP and would not be ready to proceed with radiation therapy. He however would be a candidate to proceed with androgen ablation and then to initiate external beam radiation therapy in approximately 3 months. I discussed the course of treatment side effects and potential toxicities of radiation therapy in this situation with the patient and his wife. He appears to understand and wishes to proceed with planned course of treatment. The patient will be seen again in February and at that time we'll likely ask Dr. WJeffie Pollockto place fiducial markers within the prostate gland for imaging personally purposes for his IMRT.   Plan: Patient will return in late February for further evaluation and then likely referral to Dr. WJeffie Pollockfor gold fiducial marker placement in preparation for external beam radiation therapy along with ongoing androgen ablation  ____________________________________  JBlair Promise PhD, MD  This document serves as a record of services personally performed by JGery Pray MD. It was created on his behalf by JDarcus Austin a trained medical scribe. The creation of this record is based on the scribe's personal observations and the provider's statements to them. This document has been checked and  approved by the attending provider.

## 2015-06-04 NOTE — Progress Notes (Signed)
Please see the Nurse Progress Note in the MD Initial Consult Encounter for this patient. 

## 2015-06-05 ENCOUNTER — Other Ambulatory Visit: Payer: Self-pay | Admitting: Internal Medicine

## 2015-06-09 ENCOUNTER — Telehealth: Payer: Self-pay | Admitting: *Deleted

## 2015-06-09 ENCOUNTER — Other Ambulatory Visit: Payer: Self-pay | Admitting: Internal Medicine

## 2015-06-09 MED ORDER — CARVEDILOL PHOSPHATE ER 20 MG PO CP24: ORAL_CAPSULE | ORAL | Status: DC

## 2015-06-09 NOTE — Telephone Encounter (Signed)
[facility] is advising that they need additional info regarding the script before they can fill. Patient is providing a phone # of [phone]. They gave him not info other than the fact that they need to speak with [place]. At this point, script is not being filled.

## 2015-06-09 NOTE — Telephone Encounter (Signed)
Called [facility] with karen/rep states they did not received script for Coreg CR. Transferred to pharmacist Santiago Glad) gave verbal approval for the coreg...[name]

## 2015-06-09 NOTE — Telephone Encounter (Signed)
Patient thanks you for being so good to him

## 2015-06-09 NOTE — Telephone Encounter (Signed)
Received call pt states he has been trying to get through to the office no one picking up line call keep rolling, but he is needing refill on his coreg sent to [facility]. Inform pt per chart refill was sent back to them on Dec 2nd. He stated [facility] stated they haven't heard from office. Inform pt will resend coreg to mail service...Jonathan Medina

## 2015-06-12 ENCOUNTER — Encounter: Payer: Self-pay | Admitting: *Deleted

## 2015-06-12 NOTE — Progress Notes (Signed)
[place] Psychosocial Distress Screening Clinical Social Work  Clinical Social Work was referred by distress screening protocol.  The patient scored a 8 on the Psychosocial Distress Thermometer which indicates severe distress. Clinical Social Worker reviewed chart and phoned pt to assess for distress and other psychosocial needs. Pt reports to be less anxious, but shared he was a disabled vet with PTSD. He reports he was able to overcame his lung cancer through "mind therapy" and feels this will assist him during his prostate cancer treatment. CSW educated pt about Prostate Cancer Support group and other resources, such as ACS to assist. He was very appreciative of call and agrees to reach out as needed.   ONCBCN DISTRESS SCREENING 06/04/2015  Screening Type Initial Screening  Distress experienced in past week (1-10) 8  Emotional problem type Nervousness/Anxiety;Adjusting to illness  Information Concerns Type Lack of info about treatment;Lack of info about complementary therapy choices  Physical Problem type Pain;Nausea/vomiting;Tingling hands/feet  Physician notified of physical symptoms Yes    Clinical Social Worker follow up needed: No.  If yes, follow up plan:  Loren Racer, [place]  [facility] Phone: [phone] Fax: [phone]

## 2015-06-17 DIAGNOSIS — C61 Malignant neoplasm of prostate: Secondary | ICD-10-CM | POA: Diagnosis not present

## 2015-06-22 DIAGNOSIS — M1712 Unilateral primary osteoarthritis, left knee: Secondary | ICD-10-CM | POA: Diagnosis not present

## 2015-07-01 DIAGNOSIS — C61 Malignant neoplasm of prostate: Secondary | ICD-10-CM | POA: Diagnosis not present

## 2015-07-01 DIAGNOSIS — Z5111 Encounter for antineoplastic chemotherapy: Secondary | ICD-10-CM | POA: Diagnosis not present

## 2015-07-02 ENCOUNTER — Other Ambulatory Visit: Payer: Self-pay | Admitting: Internal Medicine

## 2015-07-23 DIAGNOSIS — C61 Malignant neoplasm of prostate: Secondary | ICD-10-CM | POA: Diagnosis not present

## 2015-07-23 DIAGNOSIS — R350 Frequency of micturition: Secondary | ICD-10-CM | POA: Diagnosis not present

## 2015-07-23 DIAGNOSIS — Z0000 Encounter for general adult medical examination without abnormal findings: Secondary | ICD-10-CM | POA: Diagnosis not present

## 2015-07-23 DIAGNOSIS — R351 Nocturia: Secondary | ICD-10-CM | POA: Diagnosis not present

## 2015-07-31 ENCOUNTER — Telehealth: Payer: Self-pay | Admitting: Internal Medicine

## 2015-07-31 DIAGNOSIS — E785 Hyperlipidemia, unspecified: Secondary | ICD-10-CM

## 2015-07-31 NOTE — Telephone Encounter (Signed)
Please give patient a call back in regards to last Cholesterol reading and would like mailed out as well.

## 2015-08-03 DIAGNOSIS — C61 Malignant neoplasm of prostate: Secondary | ICD-10-CM | POA: Diagnosis not present

## 2015-08-03 NOTE — Telephone Encounter (Signed)
Lipid panel ordered. Pt informed will come to get drawn and await lab results

## 2015-08-04 LAB — PSA: PSA: 11.43

## 2015-08-05 DIAGNOSIS — C61 Malignant neoplasm of prostate: Secondary | ICD-10-CM | POA: Diagnosis not present

## 2015-08-11 ENCOUNTER — Encounter: Payer: Self-pay | Admitting: Pulmonary Disease

## 2015-08-11 ENCOUNTER — Other Ambulatory Visit (INDEPENDENT_AMBULATORY_CARE_PROVIDER_SITE_OTHER): Payer: Medicare Other

## 2015-08-11 ENCOUNTER — Ambulatory Visit (INDEPENDENT_AMBULATORY_CARE_PROVIDER_SITE_OTHER): Payer: Medicare Other | Admitting: Pulmonary Disease

## 2015-08-11 VITALS — BP 140/78 | HR 74 | Ht 68.0 in | Wt 224.8 lb

## 2015-08-11 DIAGNOSIS — I10 Essential (primary) hypertension: Secondary | ICD-10-CM

## 2015-08-11 DIAGNOSIS — E785 Hyperlipidemia, unspecified: Secondary | ICD-10-CM | POA: Diagnosis not present

## 2015-08-11 DIAGNOSIS — G4733 Obstructive sleep apnea (adult) (pediatric): Secondary | ICD-10-CM

## 2015-08-11 DIAGNOSIS — Z9989 Dependence on other enabling machines and devices: Principal | ICD-10-CM

## 2015-08-11 LAB — LIPID PANEL
CHOL/HDL RATIO: 4
Cholesterol: 238 mg/dL — ABNORMAL HIGH (ref 0–200)
HDL: 56.7 mg/dL (ref >39.00)
LDL Cholesterol: 153 mg/dL — ABNORMAL HIGH (ref 0–99)
NONHDL: 180.94
Triglycerides: 139 mg/dL (ref 0.0–149.0)
VLDL: 27.8 mg/dL (ref 0.0–40.0)

## 2015-08-11 NOTE — Addendum Note (Signed)
Addended by: Maryanna Shape A on: 08/11/2015 03:22 PM   Modules accepted: Orders

## 2015-08-11 NOTE — Assessment & Plan Note (Signed)
Your CPAP is set at 10 cm CPAP supplies will be renewed x 1 year  Try to keep good seal on nasal pillows  Weight loss encouraged, compliance with goal of at least 4-6 hrs every night is the expectation. Advised against medications with sedative side effects Cautioned against driving when sleepy - understanding that sleepiness will vary on a day to day basis  Greater than 50% time was spent in counseling and coordination of care with the patient

## 2015-08-11 NOTE — Progress Notes (Signed)
   Subjective:    Patient ID: Jonathan Medina, male    DOB: 12/26/1929, 82 y.o.   MRN: [id number]  HPI  85/M, [place] veteran, ex smoker for FU of obstructive sleep apnea & chronic insomnia/ shift work disorder h/o VATS LUlobectomy 08/2007 Arlyce Dice) for lung cancer  He has PTSD & takes cymbalta daytime for many years. he also has long standing insomnia of sleep initiation & maintenance, since his youth. 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  PSG in dec'11 showed poor sleep efficiency with TST of 197 mins, AHI 12/h , desatn < 88% x 26 mins & lowest desaturation to 76%. BMI was 35   Download 03/2012 on 10 cm shows good usage avg 5h, few residual events AHI 5/h, some leak    08/11/2015   Chief Complaint  Patient presents with  . Follow-up    pt. states he wears cpap 5.5hr. everynight. pressure set @ 10 feels that is good. no supplies needed @ this time. [place]   Diagnosed with prostate CA - RT planned CPAP is a godsend  he wears his CPAP everynight 5-6 hrs a night.  - nightmares have stopped, no snoring.  Feels rested, no leak, pressure ok.  Off  cymbalta & neurontin now  Download report reviewed 08/2015 >> AHI 8 /h on 10 cm, leak + He takes off nasal pillows for freq nocturia   Review of Systems neg for any significant sore throat, dysphagia, itching, sneezing, nasal congestion or excess/ purulent secretions, fever, chills, sweats, unintended wt loss, pleuritic or exertional cp, hempoptysis, orthopnea pnd or change in chronic leg swelling. Also denies presyncope, palpitations, heartburn, abdominal pain, nausea, vomiting, diarrhea or change in bowel or urinary habits, dysuria,hematuria, rash, arthralgias, visual complaints, headache, numbness weakness or ataxia.     Objective:   Physical Exam  Gen. Pleasant, well-nourished, in no distress ENT - no lesions, no post nasal drip Neck: No JVD, no thyromegaly, no carotid  bruits Lungs: no use of accessory muscles, no dullness to percussion, clear without rales or rhonchi  Cardiovascular: Rhythm regular, heart sounds  normal, no murmurs or gallops, no peripheral edema Musculoskeletal: No deformities, no cyanosis or clubbing        Assessment & Plan:

## 2015-08-11 NOTE — Patient Instructions (Signed)
Your CPAP is set at 10 cm CPAP supplies will be renewed x 1 year  Try to keep good seal on nasal pillows

## 2015-08-11 NOTE — Assessment & Plan Note (Signed)
controlled 

## 2015-08-12 ENCOUNTER — Encounter: Payer: Self-pay | Admitting: Internal Medicine

## 2015-08-26 ENCOUNTER — Encounter: Payer: Self-pay | Admitting: Pulmonary Disease

## 2015-08-27 ENCOUNTER — Ambulatory Visit
Admission: RE | Admit: 2015-08-27 | Discharge: 2015-08-27 | Disposition: A | Discharge status: Home / Self Care | Payer: Medicare Other | Source: Ambulatory Visit | Attending: Radiation Oncology | Admitting: Radiation Oncology

## 2015-08-27 ENCOUNTER — Encounter: Payer: Self-pay | Admitting: Radiation Oncology

## 2015-08-27 VITALS — BP 149/72 | HR 52 | Temp 98.3°F | Ht 68.0 in | Wt 226.7 lb

## 2015-08-27 DIAGNOSIS — R9721 Rising PSA following treatment for malignant neoplasm of prostate: Secondary | ICD-10-CM | POA: Diagnosis not present

## 2015-08-27 DIAGNOSIS — C61 Malignant neoplasm of prostate: Secondary | ICD-10-CM

## 2015-08-27 DIAGNOSIS — Z510 Encounter for antineoplastic radiation therapy: Secondary | ICD-10-CM | POA: Diagnosis not present

## 2015-08-27 NOTE — Progress Notes (Signed)
Jonathan Medina is here for follow up new.  He reports having gold seeds placed on 08/05/15.  He reports having a small amount of bleeding 2 weeks after the seed placement.  He stopped taking aspirin and has not had anymore bleeding.  He had a firmagon shot on 06/30/16 and 08/05/15.  His next is due 09/08/15.  He reports getting up every hour during the night.  He is taking flomax in the morning and is wondering if he can take it at bedtime instead.  BP 149/72 mmHg  Pulse 52  Temp(Src) 98.3 F (36.8 C) (Oral)  Ht '5\' 8"'$  (1.727 m)  Wt 226 lb 11.2 oz (102.83 kg)  BMI 34.48 kg/m2

## 2015-08-27 NOTE — Progress Notes (Signed)
Radiation Oncology         (336) [phone] ________________________________  Name: Jonathan Medina MRN: [id number]  Date: 08/27/2015  DOB: [birth year]  Reevaluation Note  CC: [name], MD  Irine Seal, MD    ICD-9-CM ICD-10-CM   1. Prostate cancer ([place]) 185 C61     Diagnosis: Stage T1c Adenocarcinoma of the Prostate, Gleason Score 8 (4+4), PSA (37.8), and prostate volume of 73 mL  Interval Since Last Radiation:  N/A  Narrative:  The patient returns today for further evaluation. He reports having his gold fiducial marker placed on 08/05/15 by Dr. Jeffie Pollock.  He reports having a small amount of bleeding 2 weeks after the marker placement.  He stopped taking aspirin and has not had anymore bleeding.  He had a firmagon shot on 06/30/16 and 08/05/15.  His next is due 09/08/15.  He reports breast swelling, hot flashes, bone aches, and crankiness. He attributes this to the firmagon. He reports nocturia every hour during the night.  He is taking flomax in the morning and is wondering if he can take it at bedtime instead. He presents to me today to discuss external beam radiation therapy for the management of his disease.  ALLERGIES:  is allergic to epinephrine; betadine; ivp dye; nisoldipine; shellfish allergy; simvastatin; and statins.  Meds: Current Outpatient Prescriptions  Medication Sig Dispense Refill  . amLODipine (NORVASC) 10 MG tablet TAKE 1 TABLET DAILY IN THE MORNING 90 tablet 3  . Calcium Carb-Cholecalciferol (CALCIUM 1000 + D PO) Take 1 tablet by mouth daily.    . carvedilol (COREG CR) 20 MG 24 hr capsule TAKE 1 CAPSULE EVERY       MORNING 90 capsule 3  . losartan (COZAAR) 100 MG tablet TAKE 1 TABLET EVERY MORNING 90 tablet 1  . Tamsulosin HCl (FLOMAX) 0.4 MG CAPS Take 0.4 mg by mouth daily after breakfast.     . aspirin EC 81 MG tablet Take 81 mg by mouth daily. Reported on 08/27/2015    . traMADol (ULTRAM) 50 MG tablet Take 1 tablet (50 mg total) by mouth every 6 (six) hours as needed for  moderate pain. (Patient not taking: Reported on 08/27/2015) 12 tablet 0   No current facility-administered medications for this encounter.    Physical Findings: The patient is in no acute distress. Patient is alert and oriented.  height is '5\' 8"'$  (1.727 m) and weight is 226 lb 11.2 oz (102.83 kg). His oral temperature is 98.3 F (36.8 C). His blood pressure is 149/72 and his pulse is 52.   Lungs are clear to auscultation bilaterally. Heart has regular rate and rhythm. No palpable cervical, supraclavicular, or axillary adenopathy.  Lab Findings: Lab Results  Component Value Date   WBC 6.7 03/24/2015   HGB 15.3 05/12/2015   HCT 45.0 05/12/2015   MCV 89.5 03/24/2015   PLT 311.0 03/24/2015    Radiographic Findings: No results found.  Impression: [name] is a 82 y.o. gentleman with Stage T1c adenocarcinoma of the prostate (high risk). He is a good candidate for prostate IMRT. We'll also consider elective coverage of the nodal areas given his considerable risk for nodal involvement. The patient has frequent nocturia and I advised him that he should take his Flomax at bedtime to help with his urinary symptoms.  Plan: Today I reviewed the findings and workup thus far.  We discussed the natural history of prostate cancer.  We reviewed the the implications of T-stage, Gleason's Score, and PSA on decision-making and  outcomes in prostate cancer.  We discussed radiation treatment in the management of prostate cancer with regard to the logistics and delivery of external beam radiation treatment .  The patient expressed interest in external beam radiotherapy.   The patient would like to proceed with prostate IMRT.  I will share my findings with Dr. Jeffie Pollock and move forward with scheduling CT simulation next Tuesday [facility].  He will continue on androgen ablation and likely for 2-3 years given his significant PSA score and Gleason's score of  8.   ____________________________________ -----------------------------------  Blair Promise, PhD, MD  This document serves as a record of services personally performed by Gery Pray, MD. It was created on his behalf by Darcus Austin, a trained medical scribe. The creation of this record is based on the scribe's personal observations and the provider's statements to them. This document has been checked and approved by the attending provider.

## 2015-08-27 NOTE — Progress Notes (Signed)
Please see the Nurse Progress Note in the MD Initial Consult Encounter for this patient. 

## 2015-09-01 ENCOUNTER — Ambulatory Visit
Admission: RE | Admit: 2015-09-01 | Discharge: 2015-09-01 | Disposition: A | Discharge status: Home / Self Care | Payer: Medicare Other | Source: Ambulatory Visit | Attending: Radiation Oncology | Admitting: Radiation Oncology

## 2015-09-01 DIAGNOSIS — R9721 Rising PSA following treatment for malignant neoplasm of prostate: Secondary | ICD-10-CM | POA: Diagnosis not present

## 2015-09-01 DIAGNOSIS — Z510 Encounter for antineoplastic radiation therapy: Secondary | ICD-10-CM | POA: Diagnosis not present

## 2015-09-01 DIAGNOSIS — C61 Malignant neoplasm of prostate: Secondary | ICD-10-CM

## 2015-09-02 DIAGNOSIS — Z510 Encounter for antineoplastic radiation therapy: Secondary | ICD-10-CM | POA: Insufficient documentation

## 2015-09-02 DIAGNOSIS — C61 Malignant neoplasm of prostate: Secondary | ICD-10-CM | POA: Insufficient documentation

## 2015-09-06 NOTE — Progress Notes (Signed)
  Radiation Oncology         [phone] ________________________________  Name: Jonathan Medina MRN: [id number]  Date: 09/01/2015  DOB: [birth year]  SIMULATION AND TREATMENT PLANNING NOTE    ICD-9-CM ICD-10-CM   1. Prostate cancer ([place]) 185 C61     DIAGNOSIS:  Stage T1c Adenocarcinoma of the Prostate, Gleason Score 8 (4+4), PSA (37.8), and prostate volume of 73 mL  NARRATIVE:  The patient was brought to the [facility].  Identity was confirmed.  All relevant records and images related to the planned course of therapy were reviewed.  The patient freely provided informed written consent to proceed with treatment after reviewing the details related to the planned course of therapy. The consent form was witnessed and verified by the simulation staff.  Then, the patient was set-up in a stable reproducible  supine position for radiation therapy.  CT images were obtained.  Surface markings were placed.  The CT images were loaded into the planning software.  Then the target and avoidance structures were contoured.  Treatment planning then occurred.  The radiation prescription was entered and confirmed.  Then, I designed and supervised the construction of a total of 1 medically necessary complex treatment devices.  I have requested : Intensity Modulated Radiotherapy (IMRT) is medically necessary for this case for the following reason:  Rectal sparing and small bowel sparing..  I have ordered:dose calc.  PLAN:  The patient will receive 45 Gy in 25 fractions directed at the prostate,  seminal vesicles and nodal regions of the pelvis. After 45 gray the patient will proceed with a boost to the prostate 30 gray in 15 fractions (2 gray per fraction).  Cumulative dose to the prostate will be 75 gray.  The patient will undergo daily image guidance using cone beam CT scan with fiducial markers placed within the prostate.  ________________________________ -----------------------------------  Blair Promise, PhD, MD

## 2015-09-08 DIAGNOSIS — Z510 Encounter for antineoplastic radiation therapy: Secondary | ICD-10-CM | POA: Diagnosis not present

## 2015-09-08 DIAGNOSIS — C61 Malignant neoplasm of prostate: Secondary | ICD-10-CM | POA: Diagnosis present

## 2015-09-09 ENCOUNTER — Ambulatory Visit: Payer: Medicare Other | Admitting: Pulmonary Disease

## 2015-09-09 DIAGNOSIS — C61 Malignant neoplasm of prostate: Secondary | ICD-10-CM | POA: Diagnosis not present

## 2015-09-10 ENCOUNTER — Ambulatory Visit
Admission: RE | Admit: 2015-09-10 | Discharge: 2015-09-10 | Disposition: A | Discharge status: Home / Self Care | Payer: Medicare Other | Source: Ambulatory Visit | Attending: Radiation Oncology | Admitting: Radiation Oncology

## 2015-09-10 DIAGNOSIS — C61 Malignant neoplasm of prostate: Secondary | ICD-10-CM

## 2015-09-10 DIAGNOSIS — Z510 Encounter for antineoplastic radiation therapy: Secondary | ICD-10-CM | POA: Diagnosis not present

## 2015-09-10 NOTE — Progress Notes (Signed)
  Radiation Oncology         [phone] ________________________________  Name: CRISTINO DEGROFF MRN: [id number]  Date: 09/10/2015  DOB: [birth year]  Simulation Verification Note    ICD-9-CM ICD-10-CM   1. Prostate cancer ([place]) 185 C61     Status: outpatient  NARRATIVE: The patient was brought to the treatment unit and placed in the planned treatment position. The clinical setup was verified. Then port films were obtained and uploaded to the radiation oncology medical record software.  The treatment beams were carefully compared against the planned radiation fields. The position location and shape of the radiation fields was reviewed. They targeted volume of tissue appears to be appropriately covered by the radiation beams. Organs at risk appear to be excluded as planned.  Based on my personal review, I approved the simulation verification. The patient's treatment will proceed as planned.  -----------------------------------  Blair Promise, PhD, MD  This document serves as a record of services personally performed by Gery Pray, MD. It was created on his behalf by Derek Mound, a trained medical scribe. The creation of this record is based on the scribe's personal observations and the provider's statements to them. This document has been checked and approved by the attending provider.

## 2015-09-11 ENCOUNTER — Ambulatory Visit
Admission: RE | Admit: 2015-09-11 | Discharge: 2015-09-11 | Disposition: A | Discharge status: Home / Self Care | Payer: Medicare Other | Source: Ambulatory Visit | Attending: Radiation Oncology | Admitting: Radiation Oncology

## 2015-09-11 DIAGNOSIS — Z510 Encounter for antineoplastic radiation therapy: Secondary | ICD-10-CM | POA: Diagnosis not present

## 2015-09-11 DIAGNOSIS — C61 Malignant neoplasm of prostate: Secondary | ICD-10-CM | POA: Diagnosis not present

## 2015-09-14 ENCOUNTER — Ambulatory Visit
Admission: RE | Admit: 2015-09-14 | Discharge: 2015-09-14 | Disposition: A | Discharge status: Home / Self Care | Payer: Medicare Other | Source: Ambulatory Visit | Attending: Radiation Oncology | Admitting: Radiation Oncology

## 2015-09-14 DIAGNOSIS — Z510 Encounter for antineoplastic radiation therapy: Secondary | ICD-10-CM | POA: Diagnosis not present

## 2015-09-14 DIAGNOSIS — C61 Malignant neoplasm of prostate: Secondary | ICD-10-CM | POA: Diagnosis not present

## 2015-09-14 NOTE — Progress Notes (Signed)
Pt here for patient teaching.  Pt given Radiation and You booklet. Pt reports they have not watched the Radiation Therapy Education video and has been given the link to watch the video at home.  Reviewed areas of pertinence such as diarrhea, fatigue, hair loss, skin changes and urinary and bladder changes . Pt able to give teach back of to pat skin, use unscented/gentle soap, use baby wipes, have Imodium on hand and drink plenty of water,avoid applying anything to skin within 4 hours of treatment. Pt demonstrated understanding and verbalizes understanding of information given and will contact nursing with any questions or concerns.     [email or web address]

## 2015-09-15 ENCOUNTER — Ambulatory Visit
Admission: RE | Admit: 2015-09-15 | Discharge: 2015-09-15 | Disposition: A | Discharge status: Home / Self Care | Payer: Medicare Other | Source: Ambulatory Visit | Attending: Radiation Oncology | Admitting: Radiation Oncology

## 2015-09-15 ENCOUNTER — Encounter: Payer: Self-pay | Admitting: Radiation Oncology

## 2015-09-15 VITALS — BP 131/73 | HR 66 | Temp 98.0°F | Ht 68.0 in | Wt 227.1 lb

## 2015-09-15 DIAGNOSIS — C61 Malignant neoplasm of prostate: Secondary | ICD-10-CM

## 2015-09-15 DIAGNOSIS — Z510 Encounter for antineoplastic radiation therapy: Secondary | ICD-10-CM | POA: Diagnosis not present

## 2015-09-15 NOTE — Progress Notes (Signed)
Jonathan Medina has completed 4 fractions to his pelvis.  He denies having pain.  He reports having nocturia frequently.  He reports having difficulty starting his urinary stream.  He denies having hematuria.  He denies having any bowel issues.    BP 131/73 mmHg  Pulse 66  Temp(Src) 98 F (36.7 C) (Oral)  Ht '5\' 8"'$  (1.727 m)  Wt 227 lb 1.6 oz (103.012 kg)  BMI 34.54 kg/m2

## 2015-09-15 NOTE — Progress Notes (Signed)
  Radiation Oncology         [phone] ________________________________  Name: Jonathan Medina MRN: [id number]  Date: 09/15/2015  DOB: [birth year]  Weekly Radiation Therapy Management    ICD-9-CM ICD-10-CM   1. Prostate cancer ([place]) 185 C61      Current Dose: 9 Gy     Planned Dose:  75 Gy  Narrative . . . . . . . . The patient presents for routine under treatment assessment.                                   The patient is without complaint. Jonathan Medina has completed 4 fractions to his pelvis. He denies having pain. He reports having nocturia frequently. He reports having difficulty starting his urinary stream. He denies having hematuria. He denies having any bowel issues. Urinary symptoms have been chronic and present prior to starting his radiation therapy.                                 Set-up films were reviewed.                                 The chart was checked. Physical Findings. . .  height is '5\' 8"'$  (1.727 m) and weight is 227 lb 1.6 oz (103.012 kg). His oral temperature is 98 F (36.7 C). His blood pressure is 131/73 and his pulse is 66. . Weight essentially stable.  No significant changes. Impression . . . . . . . The patient is tolerating radiation. Plan . . . . . . . . . . . . Continue treatment as planned.  ________________________________   Blair Promise, PhD, MD

## 2015-09-16 ENCOUNTER — Ambulatory Visit
Admission: RE | Admit: 2015-09-16 | Discharge: 2015-09-16 | Disposition: A | Discharge status: Home / Self Care | Payer: Medicare Other | Source: Ambulatory Visit | Attending: Radiation Oncology | Admitting: Radiation Oncology

## 2015-09-16 DIAGNOSIS — Z510 Encounter for antineoplastic radiation therapy: Secondary | ICD-10-CM | POA: Diagnosis not present

## 2015-09-16 DIAGNOSIS — C61 Malignant neoplasm of prostate: Secondary | ICD-10-CM | POA: Diagnosis not present

## 2015-09-17 ENCOUNTER — Ambulatory Visit
Admission: RE | Admit: 2015-09-17 | Discharge: 2015-09-17 | Disposition: A | Discharge status: Home / Self Care | Payer: Medicare Other | Source: Ambulatory Visit | Attending: Radiation Oncology | Admitting: Radiation Oncology

## 2015-09-17 DIAGNOSIS — C61 Malignant neoplasm of prostate: Secondary | ICD-10-CM | POA: Diagnosis not present

## 2015-09-17 DIAGNOSIS — Z510 Encounter for antineoplastic radiation therapy: Secondary | ICD-10-CM | POA: Diagnosis not present

## 2015-09-18 ENCOUNTER — Ambulatory Visit
Admission: RE | Admit: 2015-09-18 | Discharge: 2015-09-18 | Disposition: A | Discharge status: Home / Self Care | Payer: Medicare Other | Source: Ambulatory Visit | Attending: Radiation Oncology | Admitting: Radiation Oncology

## 2015-09-18 DIAGNOSIS — C61 Malignant neoplasm of prostate: Secondary | ICD-10-CM | POA: Diagnosis not present

## 2015-09-18 DIAGNOSIS — Z510 Encounter for antineoplastic radiation therapy: Secondary | ICD-10-CM | POA: Diagnosis not present

## 2015-09-21 ENCOUNTER — Ambulatory Visit
Admission: RE | Admit: 2015-09-21 | Discharge: 2015-09-21 | Disposition: A | Discharge status: Home / Self Care | Payer: Medicare Other | Source: Ambulatory Visit | Attending: Radiation Oncology | Admitting: Radiation Oncology

## 2015-09-21 DIAGNOSIS — Z510 Encounter for antineoplastic radiation therapy: Secondary | ICD-10-CM | POA: Diagnosis not present

## 2015-09-21 DIAGNOSIS — C61 Malignant neoplasm of prostate: Secondary | ICD-10-CM | POA: Diagnosis not present

## 2015-09-22 ENCOUNTER — Ambulatory Visit
Admission: RE | Admit: 2015-09-22 | Discharge: 2015-09-22 | Disposition: A | Discharge status: Home / Self Care | Payer: Medicare Other | Source: Ambulatory Visit | Attending: Radiation Oncology | Admitting: Radiation Oncology

## 2015-09-22 ENCOUNTER — Encounter: Payer: Self-pay | Admitting: Radiation Oncology

## 2015-09-22 VITALS — BP 141/52 | HR 76 | Resp 16 | Wt 227.7 lb

## 2015-09-22 DIAGNOSIS — C61 Malignant neoplasm of prostate: Secondary | ICD-10-CM | POA: Diagnosis not present

## 2015-09-22 DIAGNOSIS — Z510 Encounter for antineoplastic radiation therapy: Secondary | ICD-10-CM | POA: Diagnosis not present

## 2015-09-22 NOTE — Progress Notes (Signed)
  Radiation Oncology         [phone] ________________________________  Name: Jonathan Medina MRN: [id number]  Date: 09/22/2015  DOB: 1929-07-20  Weekly Radiation Therapy Management    ICD-9-CM ICD-10-CM   1. Prostate cancer ([place]) 185 C61      Current Dose: 16.2 Gy     Planned Dose:  75 Gy  Narrative . . . . . . . . The patient presents for routine under treatment assessment.                                   Denies pain. Reports nocturia more than every hour. Reports urinary hesitancy. Denies dysuria or hematuria. Denies incontinence or leakage. Denies diarrhea. Denies fatigue. Reports he has been taking his FLOMAX in the morning. Instructed patient to begin taking flomax at night after dinner/before bed.                                 Set-up films were reviewed.                                 The chart was checked. Physical Findings. . .  weight is 227 lb 11.2 oz (103.284 kg). His blood pressure is 141/52 and his pulse is 76. His respiration is 16 and oxygen saturation is 100%. . Weight essentially stable.  No significant changes.  The lungs are clear. The heart has a regular rhythm and rate. The abdomen is soft and nontender with normal bowel sounds. Impression . . . . . . . The patient is tolerating radiation. Plan . . . . . . . . . . . . Continue treatment as planned.  ________________________________   Blair Promise, PhD, MD

## 2015-09-22 NOTE — Progress Notes (Addendum)
Weight and vitals stable. Denies pain. Reports nocturia more than every hour. Reports urinary hesitancy. Denies dysuria or hematuria. Denies incontinence or leakage. Denies diarrhea. Denies fatigue. Reports he has been taking his FLOMAX in the morning. Instructed patient to begin taking flomax at night after dinner/before bed.  BP 141/52 mmHg  Pulse 76  Resp 16  Wt 227 lb 11.2 oz (103.284 kg)  SpO2 100% Wt Readings from Last 3 Encounters:  09/22/15 227 lb 11.2 oz (103.284 kg)  09/15/15 227 lb 1.6 oz (103.012 kg)  08/27/15 226 lb 11.2 oz (102.83 kg)

## 2015-09-23 ENCOUNTER — Ambulatory Visit
Admission: RE | Admit: 2015-09-23 | Discharge: 2015-09-23 | Disposition: A | Discharge status: Home / Self Care | Payer: Medicare Other | Source: Ambulatory Visit | Attending: Radiation Oncology | Admitting: Radiation Oncology

## 2015-09-23 DIAGNOSIS — C61 Malignant neoplasm of prostate: Secondary | ICD-10-CM | POA: Diagnosis not present

## 2015-09-23 DIAGNOSIS — Z510 Encounter for antineoplastic radiation therapy: Secondary | ICD-10-CM | POA: Diagnosis not present

## 2015-09-24 ENCOUNTER — Ambulatory Visit
Admission: RE | Admit: 2015-09-24 | Discharge: 2015-09-24 | Disposition: A | Discharge status: Home / Self Care | Payer: Medicare Other | Source: Ambulatory Visit | Attending: Radiation Oncology | Admitting: Radiation Oncology

## 2015-09-24 DIAGNOSIS — C61 Malignant neoplasm of prostate: Secondary | ICD-10-CM | POA: Diagnosis not present

## 2015-09-24 DIAGNOSIS — Z510 Encounter for antineoplastic radiation therapy: Secondary | ICD-10-CM | POA: Diagnosis not present

## 2015-09-25 ENCOUNTER — Ambulatory Visit
Admission: RE | Admit: 2015-09-25 | Discharge: 2015-09-25 | Disposition: A | Discharge status: Home / Self Care | Payer: Medicare Other | Source: Ambulatory Visit | Attending: Radiation Oncology | Admitting: Radiation Oncology

## 2015-09-25 DIAGNOSIS — C61 Malignant neoplasm of prostate: Secondary | ICD-10-CM | POA: Diagnosis not present

## 2015-09-25 DIAGNOSIS — Z510 Encounter for antineoplastic radiation therapy: Secondary | ICD-10-CM | POA: Diagnosis not present

## 2015-09-28 ENCOUNTER — Ambulatory Visit: Payer: Medicare Other

## 2015-09-29 ENCOUNTER — Ambulatory Visit: Payer: Medicare Other

## 2015-09-29 ENCOUNTER — Ambulatory Visit: Payer: Medicare Other | Admitting: Radiation Oncology

## 2015-09-29 ENCOUNTER — Ambulatory Visit
Admission: RE | Admit: 2015-09-29 | Discharge: 2015-09-29 | Disposition: A | Discharge status: Home / Self Care | Payer: Medicare Other | Source: Ambulatory Visit | Attending: Radiation Oncology | Admitting: Radiation Oncology

## 2015-09-30 ENCOUNTER — Ambulatory Visit: Payer: Medicare Other | Admitting: Radiation Oncology

## 2015-09-30 ENCOUNTER — Ambulatory Visit
Admission: RE | Admit: 2015-09-30 | Discharge: 2015-09-30 | Disposition: A | Discharge status: Home / Self Care | Payer: Medicare Other | Source: Ambulatory Visit | Attending: Radiation Oncology | Admitting: Radiation Oncology

## 2015-09-30 ENCOUNTER — Encounter: Payer: Self-pay | Admitting: Radiation Oncology

## 2015-09-30 DIAGNOSIS — C61 Malignant neoplasm of prostate: Secondary | ICD-10-CM | POA: Diagnosis not present

## 2015-09-30 DIAGNOSIS — Z510 Encounter for antineoplastic radiation therapy: Secondary | ICD-10-CM | POA: Diagnosis not present

## 2015-09-30 NOTE — Progress Notes (Signed)
  Radiation Oncology         [phone] ________________________________  Name: Jonathan Medina MRN: [id number]  Date: 09/30/2015  DOB: [birth year]  Simulation  Note    ICD-9-CM ICD-10-CM   1. Prostate cancer ([place]) 185 C61     Status: outpatient  NARRATIVE: Value today the patient underwent additional planning for radiation therapy directed prostate. Patient's treatment planning CT scan was reviewed and his boost field directed prostate gland. Patient will be treated with intensity modulated radiation therapy. Plan is for the patient 15 additional treatments at 2 gray per fraction for a boost dose of 30 gray.  Patient will continue with daily image guidance using onboard CT scanner imaging and set up to fiducial markers. -----------------------------------  Blair Promise, PhD, MD

## 2015-10-01 ENCOUNTER — Ambulatory Visit
Admission: RE | Admit: 2015-10-01 | Discharge: 2015-10-01 | Disposition: A | Discharge status: Home / Self Care | Payer: Medicare Other | Source: Ambulatory Visit | Attending: Radiation Oncology | Admitting: Radiation Oncology

## 2015-10-01 ENCOUNTER — Encounter: Payer: Self-pay | Admitting: Radiation Oncology

## 2015-10-01 VITALS — BP 154/86 | HR 75 | Temp 98.0°F | Ht 68.0 in | Wt 226.3 lb

## 2015-10-01 DIAGNOSIS — C61 Malignant neoplasm of prostate: Secondary | ICD-10-CM | POA: Diagnosis not present

## 2015-10-01 DIAGNOSIS — Z510 Encounter for antineoplastic radiation therapy: Secondary | ICD-10-CM | POA: Diagnosis not present

## 2015-10-01 NOTE — Progress Notes (Signed)
  Radiation Oncology         [phone] ________________________________  Name: Jonathan Medina MRN: [id number]  Date: 10/01/2015  DOB: 05/29/1930  Weekly Radiation Therapy Management    ICD-9-CM ICD-10-CM   1. Prostate cancer ([place]) 185 C61      Current Dose: 25.2 Gy     Planned Dose:  75 Gy  Narrative . . . . . . . . The patient presents for routine under treatment assessment.                                   Murray Durrell has completed 14 fractions to his prostate. He denies having any new pain. He does report having fatigue. He also reports having diarrhea last week. He has started following a modified diet and the diarrhea has stopped. He denies having urinary frequency. He reports getting up frequently at night to urinate but says is less since he started taking Flomax at bedtime. He denies hematuria. He reports having a burning feeling before he starts urinating. He says this started before radiation.                                  Set-up films were reviewed.                                 The chart was checked. Physical Findings. . .  height is '5\' 8"'$  (1.727 m) and weight is 226 lb 4.8 oz (102.649 kg). His oral temperature is 98 F (36.7 C). His blood pressure is 154/86 and his pulse is 75. . Weight essentially stable.  No significant changes.  The lungs are clear. The heart has a regular rhythm and rate. The abdomen is soft and nontender with normal bowel sounds.  Impression . . . . . . . The patient is tolerating radiation. Plan . . . . . . . . . . . . Continue treatment as planned.  ________________________________   Blair Promise, PhD, MD

## 2015-10-01 NOTE — Progress Notes (Addendum)
Jonathan Medina has completed 14 fractions to his prostate.  He denies having any new pain.  He does report having fatigue.  He also reports having diarrhea last week.  He has started following a brat diet and the diarrhea has stopped.  He denies having urinary frequency.  He reports getting up frequently at night to urinate but says is less since he started taking Flomax at bedtime.  He denies hematuria.  He reports having a burning feeling before he starts urinating.  He says this started before radiation.  BP 154/86 mmHg  Pulse 75  Temp(Src) 98 F (36.7 C) (Oral)  Ht '5\' 8"'$  (1.727 m)  Wt 226 lb 4.8 oz (102.649 kg)  BMI 34.42 kg/m2   Wt Readings from Last 3 Encounters:  10/01/15 226 lb 4.8 oz (102.649 kg)  09/22/15 227 lb 11.2 oz (103.284 kg)  09/15/15 227 lb 1.6 oz (103.012 kg)

## 2015-10-02 ENCOUNTER — Ambulatory Visit
Admission: RE | Admit: 2015-10-02 | Discharge: 2015-10-02 | Disposition: A | Discharge status: Home / Self Care | Payer: Medicare Other | Source: Ambulatory Visit | Attending: Radiation Oncology | Admitting: Radiation Oncology

## 2015-10-02 DIAGNOSIS — C61 Malignant neoplasm of prostate: Secondary | ICD-10-CM | POA: Diagnosis not present

## 2015-10-02 DIAGNOSIS — Z510 Encounter for antineoplastic radiation therapy: Secondary | ICD-10-CM | POA: Diagnosis not present

## 2015-10-05 ENCOUNTER — Ambulatory Visit
Admission: RE | Admit: 2015-10-05 | Discharge: 2015-10-05 | Disposition: A | Discharge status: Home / Self Care | Payer: Medicare Other | Source: Ambulatory Visit | Attending: Radiation Oncology | Admitting: Radiation Oncology

## 2015-10-05 DIAGNOSIS — Z510 Encounter for antineoplastic radiation therapy: Secondary | ICD-10-CM | POA: Diagnosis not present

## 2015-10-05 DIAGNOSIS — C61 Malignant neoplasm of prostate: Secondary | ICD-10-CM | POA: Diagnosis not present

## 2015-10-06 ENCOUNTER — Ambulatory Visit
Admission: RE | Admit: 2015-10-06 | Discharge: 2015-10-06 | Disposition: A | Discharge status: Home / Self Care | Payer: Medicare Other | Source: Ambulatory Visit | Attending: Radiation Oncology | Admitting: Radiation Oncology

## 2015-10-06 ENCOUNTER — Encounter: Payer: Self-pay | Admitting: Radiation Oncology

## 2015-10-06 VITALS — BP 124/57 | HR 67 | Temp 98.7°F | Ht 68.0 in | Wt 227.4 lb

## 2015-10-06 DIAGNOSIS — Z510 Encounter for antineoplastic radiation therapy: Secondary | ICD-10-CM | POA: Diagnosis not present

## 2015-10-06 DIAGNOSIS — R300 Dysuria: Secondary | ICD-10-CM

## 2015-10-06 DIAGNOSIS — C61 Malignant neoplasm of prostate: Secondary | ICD-10-CM

## 2015-10-06 LAB — URINALYSIS, MICROSCOPIC - CHCC
BILIRUBIN (URINE): NEGATIVE
Glucose: NEGATIVE mg/dL
KETONES: NEGATIVE mg/dL
Leukocyte Esterase: NEGATIVE
Nitrite: NEGATIVE
PH: 6 (ref 4.6–8.0)
SPECIFIC GRAVITY, URINE: 1.015 (ref 1.003–1.035)
UROBILINOGEN UR: 0.2 mg/dL (ref 0.2–1)

## 2015-10-06 NOTE — Progress Notes (Signed)
  Radiation Oncology         [phone] ________________________________  Name: Jonathan Medina MRN: [id number]  Date: 10/06/2015  DOB: 06/01/1930  Weekly Radiation Therapy Management    ICD-9-CM ICD-10-CM   1. Prostate cancer ([place]) 185 C61      Current Dose: 30.6 Gy     Planned Dose:  75 Gy  Narrative . . . . . . . . The patient presents for routine under treatment assessment.                                   Jonathan Medina has completed 17 fractions to his prostate. He denies having any pain today. He reports that his nocturia went down to 3 times a night Saturday and Sunday. He reports he got up 10-15 times last night. He also reports having pain in his bladder on Monday night. He had dark urine and then passed "two tissue like material." He said it was pink and pulp like. He said the pain was relieved after he passed the tissue. He has an appointment for [facility] injection tomorrow. He denies having diarrhea. He does report having fatigue                                 Set-up films were reviewed.                                 The chart was checked. Physical Findings. . .  height is '5\' 8"'$  (1.727 m) and weight is 227 lb 6.4 oz (103.148 kg). His oral temperature is 98.7 F (37.1 C). His blood pressure is 124/57 and his pulse is 67. . The lungs are clear. The heart has a regular rhythm and rate. The abdomen is soft and nontender with normal bowel sounds. Impression . . . . . . . The patient is tolerating radiation. Plan . . . . . . . . . . . . Continue treatment as planned. We'll check a urine specimen in light of issues as above to rule out possible bladder infection.  ________________________________   Blair Promise, PhD, MD

## 2015-10-06 NOTE — Addendum Note (Signed)
Encounter addended by: Jacqulyn Liner, RN on: 10/06/2015  3:26 PM<BR>     Documentation filed: [employer], Orders

## 2015-10-06 NOTE — Progress Notes (Signed)
Jonathan Medina has completed 17 fractions to his prostate.  He denies having any pain today.  He reports that his nocturia went down to 3 times a night Saturday and Sunday.  He reports he got up 10-15 times last night.  He also reports having pain in his bladder on Monday night.  He had dark urine and then passed "two tissue like material."  He said it was pink and pulp like.  He said the pain was relieved after he passed the tissue.  He has an appointment for [facility] injection tomorrow.  He denies having diarrhea.  He does report having fatigue.  BP 124/57 mmHg  Pulse 67  Temp(Src) 98.7 F (37.1 C) (Oral)  Ht '5\' 8"'$  (1.727 m)  Wt 227 lb 6.4 oz (103.148 kg)  BMI 34.58 kg/m2   Wt Readings from Last 3 Encounters:  10/06/15 227 lb 6.4 oz (103.148 kg)  10/01/15 226 lb 4.8 oz (102.649 kg)  09/22/15 227 lb 11.2 oz (103.284 kg)

## 2015-10-07 ENCOUNTER — Ambulatory Visit
Admission: RE | Admit: 2015-10-07 | Discharge: 2015-10-07 | Disposition: A | Discharge status: Home / Self Care | Payer: Medicare Other | Source: Ambulatory Visit | Attending: Radiation Oncology | Admitting: Radiation Oncology

## 2015-10-07 DIAGNOSIS — C61 Malignant neoplasm of prostate: Secondary | ICD-10-CM | POA: Diagnosis not present

## 2015-10-07 DIAGNOSIS — Z510 Encounter for antineoplastic radiation therapy: Secondary | ICD-10-CM | POA: Diagnosis not present

## 2015-10-08 ENCOUNTER — Ambulatory Visit
Admission: RE | Admit: 2015-10-08 | Discharge: 2015-10-08 | Disposition: A | Discharge status: Home / Self Care | Payer: Medicare Other | Source: Ambulatory Visit | Attending: Radiation Oncology | Admitting: Radiation Oncology

## 2015-10-08 DIAGNOSIS — C61 Malignant neoplasm of prostate: Secondary | ICD-10-CM | POA: Diagnosis not present

## 2015-10-08 DIAGNOSIS — Z510 Encounter for antineoplastic radiation therapy: Secondary | ICD-10-CM | POA: Diagnosis not present

## 2015-10-08 LAB — URINE CULTURE

## 2015-10-08 NOTE — Progress Notes (Signed)
Notified patient's wife that his urine culture came back negative for infection.  She verbalized understanding and agreement.

## 2015-10-09 ENCOUNTER — Ambulatory Visit
Admission: RE | Admit: 2015-10-09 | Discharge: 2015-10-09 | Disposition: A | Discharge status: Home / Self Care | Payer: Medicare Other | Source: Ambulatory Visit | Attending: Radiation Oncology | Admitting: Radiation Oncology

## 2015-10-09 DIAGNOSIS — Z510 Encounter for antineoplastic radiation therapy: Secondary | ICD-10-CM | POA: Diagnosis not present

## 2015-10-09 DIAGNOSIS — C61 Malignant neoplasm of prostate: Secondary | ICD-10-CM | POA: Diagnosis not present

## 2015-10-12 ENCOUNTER — Ambulatory Visit
Admission: RE | Admit: 2015-10-12 | Discharge: 2015-10-12 | Disposition: A | Discharge status: Home / Self Care | Payer: Medicare Other | Source: Ambulatory Visit | Attending: Radiation Oncology | Admitting: Radiation Oncology

## 2015-10-12 DIAGNOSIS — Z510 Encounter for antineoplastic radiation therapy: Secondary | ICD-10-CM | POA: Diagnosis not present

## 2015-10-12 DIAGNOSIS — C61 Malignant neoplasm of prostate: Secondary | ICD-10-CM | POA: Diagnosis not present

## 2015-10-13 ENCOUNTER — Encounter: Payer: Self-pay | Admitting: Radiation Oncology

## 2015-10-13 ENCOUNTER — Ambulatory Visit
Admission: RE | Admit: 2015-10-13 | Discharge: 2015-10-13 | Disposition: A | Discharge status: Home / Self Care | Payer: Medicare Other | Source: Ambulatory Visit | Attending: Radiation Oncology | Admitting: Radiation Oncology

## 2015-10-13 VITALS — BP 102/58 | HR 63 | Temp 98.4°F | Ht 68.0 in | Wt 224.9 lb

## 2015-10-13 DIAGNOSIS — C61 Malignant neoplasm of prostate: Secondary | ICD-10-CM | POA: Diagnosis not present

## 2015-10-13 DIAGNOSIS — Z510 Encounter for antineoplastic radiation therapy: Secondary | ICD-10-CM | POA: Diagnosis not present

## 2015-10-13 NOTE — Progress Notes (Signed)
Jonathan Medina has completed 22 fractions to his prostate.  He denies pain.  He continues to have dysuria.  He reports getting up "23 times" last night to urinate.  He reports seeing more blood in his urine and has started taking his aspirin every other day.  He denies having diarrhea and fatigue.  BP 102/58 mmHg  Pulse 63  Temp(Src) 98.4 F (36.9 C) (Oral)  Ht '5\' 8"'$  (1.727 m)  Wt 224 lb 14.4 oz (102.014 kg)  BMI 34.20 kg/m2   Wt Readings from Last 3 Encounters:  10/13/15 224 lb 14.4 oz (102.014 kg)  10/06/15 227 lb 6.4 oz (103.148 kg)  10/01/15 226 lb 4.8 oz (102.649 kg)

## 2015-10-13 NOTE — Progress Notes (Signed)
  Radiation Oncology         [phone] ________________________________  Name: Jonathan Medina MRN: [id number]  Date: 10/13/2015  DOB: 21-Aug-1929  Weekly Radiation Therapy Management    ICD-9-CM ICD-10-CM   1. Prostate cancer ([place]) 185 C61      Current Dose: 39.6 Gy     Planned Dose:  75 Gy  Narrative . . . . . . . . The patient presents for routine under treatment assessment.                                Kyrell Ruacho has completed 22 fractions to his prostate. He denies pain. He continues to have dysuria. He reports getting up "23 times" last night to urinate. He reports seeing more blood in his urine and has started taking his aspirin every other day. He denies having diarrhea and fatigue. Once his urinary frequency slowed down in the evening he gets 5 solid hours of sleep from approximately 4 AM to 9 AM.                                Set-up films were reviewed.                                  The chart was checked. Physical Findings. . .  height is '5\' 8"'$  (1.727 m) and weight is 224 lb 14.4 oz (102.014 kg). His oral temperature is 98.4 F (36.9 C). His blood pressure is 102/58 and his pulse is 63. . The lungs are clear. The heart has a regular rhythm and rate. The abdomen is soft and nontender with normal bowel sounds. Impression . . . . . . . The patient is tolerating radiation. Plan . . . . . . . . . . . . Continue treatment as planned.  ________________________________   Blair Promise, PhD, MD

## 2015-10-14 ENCOUNTER — Ambulatory Visit
Admission: RE | Admit: 2015-10-14 | Discharge: 2015-10-14 | Disposition: A | Discharge status: Home / Self Care | Payer: Medicare Other | Source: Ambulatory Visit | Attending: Radiation Oncology | Admitting: Radiation Oncology

## 2015-10-14 DIAGNOSIS — Z510 Encounter for antineoplastic radiation therapy: Secondary | ICD-10-CM | POA: Diagnosis not present

## 2015-10-14 DIAGNOSIS — C61 Malignant neoplasm of prostate: Secondary | ICD-10-CM | POA: Diagnosis not present

## 2015-10-15 ENCOUNTER — Ambulatory Visit: Payer: Medicare Other

## 2015-10-15 DIAGNOSIS — C61 Malignant neoplasm of prostate: Secondary | ICD-10-CM | POA: Diagnosis not present

## 2015-10-15 DIAGNOSIS — Z510 Encounter for antineoplastic radiation therapy: Secondary | ICD-10-CM | POA: Diagnosis not present

## 2015-10-16 ENCOUNTER — Ambulatory Visit: Payer: Medicare Other

## 2015-10-16 DIAGNOSIS — Z510 Encounter for antineoplastic radiation therapy: Secondary | ICD-10-CM | POA: Diagnosis not present

## 2015-10-16 DIAGNOSIS — C61 Malignant neoplasm of prostate: Secondary | ICD-10-CM | POA: Diagnosis not present

## 2015-10-19 ENCOUNTER — Ambulatory Visit: Payer: Medicare Other

## 2015-10-19 DIAGNOSIS — Z510 Encounter for antineoplastic radiation therapy: Secondary | ICD-10-CM | POA: Diagnosis not present

## 2015-10-19 DIAGNOSIS — C61 Malignant neoplasm of prostate: Secondary | ICD-10-CM | POA: Diagnosis not present

## 2015-10-20 ENCOUNTER — Ambulatory Visit
Admission: RE | Admit: 2015-10-20 | Discharge: 2015-10-20 | Disposition: A | Discharge status: Home / Self Care | Payer: Medicare Other | Source: Ambulatory Visit | Attending: Radiation Oncology | Admitting: Radiation Oncology

## 2015-10-20 ENCOUNTER — Encounter: Payer: Self-pay | Admitting: Radiation Oncology

## 2015-10-20 VITALS — BP 120/63 | HR 63 | Temp 98.0°F | Ht 68.0 in | Wt 223.7 lb

## 2015-10-20 DIAGNOSIS — Z0189 Encounter for other specified special examinations: Secondary | ICD-10-CM | POA: Diagnosis not present

## 2015-10-20 DIAGNOSIS — R309 Painful micturition, unspecified: Secondary | ICD-10-CM | POA: Diagnosis not present

## 2015-10-20 DIAGNOSIS — C61 Malignant neoplasm of prostate: Secondary | ICD-10-CM

## 2015-10-20 DIAGNOSIS — Z510 Encounter for antineoplastic radiation therapy: Secondary | ICD-10-CM | POA: Diagnosis not present

## 2015-10-20 DIAGNOSIS — Z923 Personal history of irradiation: Secondary | ICD-10-CM | POA: Insufficient documentation

## 2015-10-20 LAB — CBC WITH DIFFERENTIAL/PLATELET
BASO%: 0.7 % (ref 0.0–2.0)
Basophils Absolute: 0 10*3/uL (ref 0.0–0.1)
EOS%: 2 % (ref 0.0–7.0)
Eosinophils Absolute: 0.1 10*3/uL (ref 0.0–0.5)
HEMATOCRIT: 35.9 % — AB (ref 38.4–49.9)
HEMOGLOBIN: 12.1 g/dL — AB (ref 13.0–17.1)
LYMPH#: 0.9 10*3/uL (ref 0.9–3.3)
LYMPH%: 19.5 % (ref 14.0–49.0)
MCH: 30.8 pg (ref 27.2–33.4)
MCHC: 33.7 g/dL (ref 32.0–36.0)
MCV: 91.3 fL (ref 79.3–98.0)
MONO#: 0.5 10*3/uL (ref 0.1–0.9)
MONO%: 11.6 % (ref 0.0–14.0)
NEUT%: 66.2 % (ref 39.0–75.0)
NEUTROS ABS: 3 10*3/uL (ref 1.5–6.5)
PLATELETS: 218 10*3/uL (ref 140–400)
RBC: 3.93 10*6/uL — ABNORMAL LOW (ref 4.20–5.82)
RDW: 13.2 % (ref 11.0–14.6)
WBC: 4.5 10*3/uL (ref 4.0–10.3)

## 2015-10-20 LAB — BASIC METABOLIC PANEL
ANION GAP: 8 meq/L (ref 3–11)
BUN: 20.3 mg/dL (ref 7.0–26.0)
CHLORIDE: 105 meq/L (ref 98–109)
CO2: 26 mEq/L (ref 22–29)
Calcium: 9.8 mg/dL (ref 8.4–10.4)
Creatinine: 1.1 mg/dL (ref 0.7–1.3)
EGFR: 74 mL/min/{1.73_m2} — AB (ref >90)
GLUCOSE: 89 mg/dL (ref 70–140)
POTASSIUM: 5 meq/L (ref 3.5–5.1)
Sodium: 139 mEq/L (ref 136–145)

## 2015-10-20 NOTE — Progress Notes (Signed)
Mr. Jonathan Medina is here for his 27th fraction of radiation to his Pelvis and Prostate. He reports burning with urination at times. He reports pain a 10/10 right before he needs to urinate, which completely goes away after urination. He reports passing blood clots and what appears to be tissue when he voids at times. He says the pain is the worse when he does pass a clot or tissue pieces. He reports getting up 25-30 times at night, and does not feel like he empties his bladder completely.  At times when he feels the urge to void, he is unable to void, which increases the frequency in which he is getting up to use the bathroom. He denies any problems with his bowel and has a bowel movement every day. He reports feeling dizzy after his treatment today, but feels better at this time.    BP 120/63 mmHg  Pulse 63  Temp(Src) 98 F (36.7 C)  Ht '5\' 8"'$  (1.727 m)  Wt 223 lb 11.2 oz (101.47 kg)  BMI 34.02 kg/m2   Wt Readings from Last 3 Encounters:  10/20/15 223 lb 11.2 oz (101.47 kg)  10/13/15 224 lb 14.4 oz (102.014 kg)  10/06/15 227 lb 6.4 oz (103.148 kg)

## 2015-10-20 NOTE — Progress Notes (Signed)
  Radiation Oncology         [phone] ________________________________  Name: Jonathan Medina MRN: [id number]  Date: 10/20/2015  DOB: 1930-02-06  Weekly Radiation Therapy Management    ICD-9-CM ICD-10-CM   1. Prostate cancer ([place]) 185 C61 CBC with Differential     Basic metabolic panel     Current Dose: 45 Gy     Planned Dose:  75 Gy  Narrative . . . . . . . . The patient presents for routine under treatment assessment.                      Jonathan Medina is here for his 27th fraction of radiation to his Pelvis and Prostate. He reports burning with urination at times. He reports pain as a 10/10 right before he needs to urinate, which completely goes away after urination. He reports passing blood clots and what appears to be tissue when he voids at times. He says the pain is the worst when he passes a clot or tissue. He reports getting up 25-30 times at night and does not feel like he empties his bladder completely.  At times when he feels the urge to void, he is unable to void, which increases the frequency in which he is getting up to use the bathroom. He denies any problems with his bowel and has a bowel movement every day. He reports feeling dizzy after his treatment today, but feels better at this time.                                 Set-up films were reviewed.                                  The chart was checked. Physical Findings. . .  height is '5\' 8"'$  (1.727 m) and weight is 223 lb 11.2 oz (101.47 kg). His temperature is 98 F (36.7 C). His blood pressure is 120/63 and his pulse is 63. . The lungs are clear. The heart has a regular rhythm and rate. The abdomen is soft and nontender with normal bowel sounds. Impression . . . . . . . The patient is tolerating radiation. Plan . . . . . . . . . . . . Continue treatment as planned. I will have the patient go to the lab to check his CBC and chemistry to determine if the patient could be anemic from his hematuria. He does not wish to have a  break in his radiation treatment for his symptomatology  ________________________________   Blair Promise, PhD, MD  This document serves as a record of services personally performed by Gery Pray, MD. It was created on his behalf by Darcus Austin, a trained medical scribe. The creation of this record is based on the scribe's personal observations and the provider's statements to them. This document has been checked and approved by the attending provider.

## 2015-10-21 ENCOUNTER — Ambulatory Visit
Admission: RE | Admit: 2015-10-21 | Discharge: 2015-10-21 | Disposition: A | Discharge status: Home / Self Care | Payer: Medicare Other | Source: Ambulatory Visit | Attending: Radiation Oncology | Admitting: Radiation Oncology

## 2015-10-21 DIAGNOSIS — Z510 Encounter for antineoplastic radiation therapy: Secondary | ICD-10-CM | POA: Diagnosis not present

## 2015-10-21 DIAGNOSIS — C61 Malignant neoplasm of prostate: Secondary | ICD-10-CM | POA: Diagnosis not present

## 2015-10-22 ENCOUNTER — Ambulatory Visit
Admission: RE | Admit: 2015-10-22 | Discharge: 2015-10-22 | Disposition: A | Discharge status: Home / Self Care | Payer: Medicare Other | Source: Ambulatory Visit | Attending: Radiation Oncology | Admitting: Radiation Oncology

## 2015-10-22 DIAGNOSIS — C61 Malignant neoplasm of prostate: Secondary | ICD-10-CM | POA: Diagnosis not present

## 2015-10-22 DIAGNOSIS — Z510 Encounter for antineoplastic radiation therapy: Secondary | ICD-10-CM | POA: Diagnosis not present

## 2015-10-23 ENCOUNTER — Ambulatory Visit
Admission: RE | Admit: 2015-10-23 | Discharge: 2015-10-23 | Disposition: A | Discharge status: Home / Self Care | Payer: Medicare Other | Source: Ambulatory Visit | Attending: Radiation Oncology | Admitting: Radiation Oncology

## 2015-10-23 DIAGNOSIS — C61 Malignant neoplasm of prostate: Secondary | ICD-10-CM | POA: Diagnosis not present

## 2015-10-23 DIAGNOSIS — Z510 Encounter for antineoplastic radiation therapy: Secondary | ICD-10-CM | POA: Diagnosis not present

## 2015-10-26 ENCOUNTER — Ambulatory Visit
Admission: RE | Admit: 2015-10-26 | Discharge: 2015-10-26 | Disposition: A | Discharge status: Home / Self Care | Payer: Medicare Other | Source: Ambulatory Visit | Attending: Radiation Oncology | Admitting: Radiation Oncology

## 2015-10-26 DIAGNOSIS — C61 Malignant neoplasm of prostate: Secondary | ICD-10-CM | POA: Diagnosis not present

## 2015-10-26 DIAGNOSIS — Z510 Encounter for antineoplastic radiation therapy: Secondary | ICD-10-CM | POA: Diagnosis not present

## 2015-10-27 ENCOUNTER — Ambulatory Visit
Admission: RE | Admit: 2015-10-27 | Discharge: 2015-10-27 | Disposition: A | Discharge status: Home / Self Care | Payer: Medicare Other | Source: Ambulatory Visit | Attending: Radiation Oncology | Admitting: Radiation Oncology

## 2015-10-27 ENCOUNTER — Encounter: Payer: Self-pay | Admitting: Radiation Oncology

## 2015-10-27 VITALS — BP 123/68 | HR 70 | Temp 98.6°F | Ht 68.0 in | Wt 223.0 lb

## 2015-10-27 DIAGNOSIS — C61 Malignant neoplasm of prostate: Secondary | ICD-10-CM | POA: Diagnosis not present

## 2015-10-27 DIAGNOSIS — Z510 Encounter for antineoplastic radiation therapy: Secondary | ICD-10-CM | POA: Diagnosis not present

## 2015-10-27 MED ORDER — TRAMADOL HCL 50 MG PO TABS: 50.0000 mg | ORAL_TABLET | Freq: Four times a day (QID) | ORAL | Status: DC | PRN

## 2015-10-27 MED ORDER — PHENAZOPYRIDINE HCL 100 MG PO TABS
100.0000 mg | ORAL_TABLET | Freq: Three times a day (TID) | ORAL | Status: DC | PRN
Start: 2015-10-27 — End: 2015-12-29

## 2015-10-27 NOTE — Progress Notes (Signed)
  Radiation Oncology         [phone] ________________________________  Name: Jonathan Medina MRN: [id number]  Date: 10/27/2015  DOB: 1930/01/08  Weekly Radiation Therapy Management    ICD-9-CM ICD-10-CM   1. Prostate cancer ([facility]) 185 C61      Current Dose: 59 Gy     Planned Dose:  75 Gy  Narrative . . . . . . . . The patient presents for routine under treatment assessment.                       Jonathan Medina has completed 32 fractions to his prostate. He reports increasing pain in the "mouth" of his bladder" this week. He said the pain is always there (as moderate), but gets worse with urination (as a 10/10). He reports hematuria each time he finishes urinating. He also reports urinary frequency and nocturia multiple times per night. He is wondering if he should see Dr. Jeffie Pollock. He denies have diarrhea. He does report having fatigue. He took a tablet of tramadol earlier and it helped a little.  He is able to empty his bladder.                                Set-up films were reviewed.                                  The chart was checked. Physical Findings. . .  height is '5\' 8"'$  (1.727 m) and weight is 223 lb (101.152 kg). His oral temperature is 98.6 F (37 C). His blood pressure is 123/68 and his pulse is 70.  The lungs are clear. The heart has a regular rhythm and rate. The abdomen is soft and nontender with normal bowel sounds. Impression . . . . . . . The patient is tolerating radiation. Plan . . . . . . . . . . . . Continue treatment as planned. I will prescribe pyridium and refill his tramadol.  ________________________________   Blair Promise, PhD, MD  This document serves as a record of services personally performed by Gery Pray, MD. It was created on his behalf by Darcus Austin, a trained medical scribe. The creation of this record is based on the scribe's personal observations and the provider's statements to them. This document has been checked and approved by the  attending provider.

## 2015-10-27 NOTE — Progress Notes (Addendum)
Jonathan Medina has completed 32 fractions to his prostate.  He reports increasing pain in the "mough of his bladder" this week.  He said the pain is always there but gets worse with urination.  He reports having hematuria each time he finishes urinating.  He also reports urinary frequency and nocturia multiple times per night.  He is wondering if he should see [name].  He denies have diarrhea.  He does report having fatigue.  BP 123/68 mmHg  Pulse 70  Temp(Src) 98.6 F (37 C) (Oral)  Ht '5\' 8"'$  (1.727 m)  Wt 223 lb (101.152 kg)  BMI 33.91 kg/m2   Wt Readings from Last 3 Encounters:  10/27/15 223 lb (101.152 kg)  10/20/15 223 lb 11.2 oz (101.47 kg)  10/13/15 224 lb 14.4 oz (102.014 kg)

## 2015-10-28 ENCOUNTER — Telehealth: Payer: Self-pay | Admitting: Oncology

## 2015-10-28 ENCOUNTER — Ambulatory Visit
Admission: RE | Admit: 2015-10-28 | Discharge: 2015-10-28 | Disposition: A | Discharge status: Home / Self Care | Payer: Medicare Other | Source: Ambulatory Visit | Attending: Radiation Oncology | Admitting: Radiation Oncology

## 2015-10-28 DIAGNOSIS — C61 Malignant neoplasm of prostate: Secondary | ICD-10-CM | POA: Diagnosis not present

## 2015-10-28 DIAGNOSIS — Z510 Encounter for antineoplastic radiation therapy: Secondary | ICD-10-CM | POA: Diagnosis not present

## 2015-10-28 NOTE — Telephone Encounter (Signed)
Dj called and said the prescription for pyridium was not at [facility].  He said it probably went to the mail order service.  Called in prescription [facility] to [facility] on [address] and also called [facility] mail [facility] and notified them that the prescription should go to the retail store.

## 2015-10-29 ENCOUNTER — Ambulatory Visit
Admission: RE | Admit: 2015-10-29 | Discharge: 2015-10-29 | Disposition: A | Discharge status: Home / Self Care | Payer: Medicare Other | Source: Ambulatory Visit | Attending: Radiation Oncology | Admitting: Radiation Oncology

## 2015-10-29 DIAGNOSIS — Z510 Encounter for antineoplastic radiation therapy: Secondary | ICD-10-CM | POA: Diagnosis not present

## 2015-10-29 DIAGNOSIS — C61 Malignant neoplasm of prostate: Secondary | ICD-10-CM | POA: Diagnosis not present

## 2015-10-30 ENCOUNTER — Ambulatory Visit: Payer: Medicare Other

## 2015-10-30 ENCOUNTER — Ambulatory Visit
Admission: RE | Admit: 2015-10-30 | Discharge: 2015-10-30 | Disposition: A | Discharge status: Home / Self Care | Payer: Medicare Other | Source: Ambulatory Visit | Attending: Radiation Oncology | Admitting: Radiation Oncology

## 2015-11-02 ENCOUNTER — Ambulatory Visit
Admission: RE | Admit: 2015-11-02 | Discharge: 2015-11-02 | Disposition: A | Discharge status: Home / Self Care | Payer: Medicare Other | Source: Ambulatory Visit | Attending: Radiation Oncology | Admitting: Radiation Oncology

## 2015-11-02 ENCOUNTER — Telehealth: Payer: Self-pay | Admitting: Oncology

## 2015-11-02 DIAGNOSIS — C61 Malignant neoplasm of prostate: Secondary | ICD-10-CM | POA: Diagnosis not present

## 2015-11-02 DIAGNOSIS — Z510 Encounter for antineoplastic radiation therapy: Secondary | ICD-10-CM | POA: Diagnosis not present

## 2015-11-02 NOTE — Telephone Encounter (Signed)
Jonathan Medina called and said he has stopped taking tramadol due to constipation and also stopped pyridium on Saturday.  He said the pyridium slowed his urinary flow to a "trickle" and also caused him to have severe abdominal pain.  He continues to have pain with urination and also states his urine is pink towards the end.  He said the bleeding has not increased in amount.  He wants to know if it is OK for him to have treatment today.  Advised him that he should have radiation today per Dr. Sondra Come.  Jonathan Medina verbalized agreement and understanding.

## 2015-11-03 ENCOUNTER — Encounter: Payer: Self-pay | Admitting: Radiation Oncology

## 2015-11-03 ENCOUNTER — Ambulatory Visit
Admission: RE | Admit: 2015-11-03 | Discharge: 2015-11-03 | Disposition: A | Discharge status: Home / Self Care | Payer: Medicare Other | Source: Ambulatory Visit | Attending: Radiation Oncology | Admitting: Radiation Oncology

## 2015-11-03 VITALS — BP 130/64 | HR 80 | Temp 98.2°F | Ht 68.0 in | Wt 217.9 lb

## 2015-11-03 DIAGNOSIS — C61 Malignant neoplasm of prostate: Secondary | ICD-10-CM

## 2015-11-03 DIAGNOSIS — Z510 Encounter for antineoplastic radiation therapy: Secondary | ICD-10-CM | POA: Diagnosis not present

## 2015-11-03 NOTE — Progress Notes (Signed)
Jonathan Medina has completed 36 fractions to his prostate.  He reports the pain he is having today has decreased and is at the level of what he had before radiation.  He reports he has had a small amount of hematuria today.  He reports he is still getting up frequently to urinate during the night.  He denies having any bowel issues.  He denies having fatigue.  BP 130/64 mmHg  Pulse 80  Temp(Src) 98.2 F (36.8 C) (Oral)  Ht '5\' 8"'$  (1.727 m)  Wt 217 lb 14.4 oz (98.839 kg)  BMI 33.14 kg/m2  Wt Readings from Last 3 Encounters:  11/03/15 217 lb 14.4 oz (98.839 kg)  10/27/15 223 lb (101.152 kg)  10/20/15 223 lb 11.2 oz (101.47 kg)

## 2015-11-03 NOTE — Progress Notes (Signed)
  Radiation Oncology         [phone] ________________________________  Name: Jonathan Medina MRN: [id number]  Date: 11/03/2015  DOB: 1930-07-02  Weekly Radiation Therapy Management  No diagnosis found.   Current Dose: 67 Gy     Planned Dose:  75 Gy  Narrative . . . . . . . . The patient presents for routine under treatment assessment.                     Jonathan Medina has completed 36 fractions to his prostate. He stopped using the Tramadol since he states it caused constipation. Since the last under-treat, he states he has a lot of urinary bleeding and abdominal pain and stopped taking the Pyridium. He reports the pain he is having today has decreased and is at the level of what he had before radiation. He reports he had a small amount of hematuria today. He reports he is still getting up frequently to urinate during the night. He denies having any bowel issues or fatigue.                                Set-up films were reviewed.                                  The chart was checked. Physical Findings. . .  height is '5\' 8"'$  (1.727 m) and weight is 217 lb 14.4 oz (98.839 kg). His oral temperature is 98.2 F (36.8 C). His blood pressure is 130/64 and his pulse is 80.  The lungs are clear. The heart has a regular rhythm and rate. The abdomen is soft and nontender with normal bowel sounds. Impression . . . . . . . The patient is tolerating radiation. The patient inquired to using his riding lawnmower to cut his grass. I told him that was fine. Plan . . . . . . . . . . . . Continue treatment as planned. ________________________________   Blair Promise, PhD, MD  This document serves as a record of services personally performed by Gery Pray, MD. It was created on his behalf by Darcus Austin, a trained medical scribe. The creation of this record is based on the scribe's personal observations and the provider's statements to them. This document has been checked and approved by the attending  provider.

## 2015-11-04 ENCOUNTER — Ambulatory Visit: Payer: Medicare Other

## 2015-11-04 DIAGNOSIS — C61 Malignant neoplasm of prostate: Secondary | ICD-10-CM | POA: Diagnosis not present

## 2015-11-04 DIAGNOSIS — Z510 Encounter for antineoplastic radiation therapy: Secondary | ICD-10-CM | POA: Diagnosis not present

## 2015-11-05 ENCOUNTER — Ambulatory Visit: Payer: Medicare Other

## 2015-11-05 DIAGNOSIS — Z510 Encounter for antineoplastic radiation therapy: Secondary | ICD-10-CM | POA: Diagnosis not present

## 2015-11-05 DIAGNOSIS — C61 Malignant neoplasm of prostate: Secondary | ICD-10-CM | POA: Diagnosis not present

## 2015-11-06 ENCOUNTER — Ambulatory Visit: Payer: Medicare Other

## 2015-11-06 DIAGNOSIS — C61 Malignant neoplasm of prostate: Secondary | ICD-10-CM | POA: Diagnosis not present

## 2015-11-06 DIAGNOSIS — Z510 Encounter for antineoplastic radiation therapy: Secondary | ICD-10-CM | POA: Diagnosis not present

## 2015-11-09 ENCOUNTER — Ambulatory Visit: Payer: Medicare Other

## 2015-11-09 ENCOUNTER — Encounter: Payer: Self-pay | Admitting: Radiation Oncology

## 2015-11-09 ENCOUNTER — Inpatient Hospital Stay: Admission: RE | Admit: 2015-11-09 | Payer: Self-pay | Source: Ambulatory Visit | Admitting: Radiation Oncology

## 2015-11-09 DIAGNOSIS — C61 Malignant neoplasm of prostate: Secondary | ICD-10-CM | POA: Diagnosis not present

## 2015-11-09 DIAGNOSIS — Z510 Encounter for antineoplastic radiation therapy: Secondary | ICD-10-CM | POA: Diagnosis not present

## 2015-11-11 DIAGNOSIS — C61 Malignant neoplasm of prostate: Secondary | ICD-10-CM | POA: Diagnosis not present

## 2015-11-11 DIAGNOSIS — Z5111 Encounter for antineoplastic chemotherapy: Secondary | ICD-10-CM | POA: Diagnosis not present

## 2015-11-17 DIAGNOSIS — Z0000 Encounter for general adult medical examination without abnormal findings: Secondary | ICD-10-CM | POA: Diagnosis not present

## 2015-11-17 DIAGNOSIS — R3129 Other microscopic hematuria: Secondary | ICD-10-CM | POA: Diagnosis not present

## 2015-11-17 DIAGNOSIS — R310 Gross hematuria: Secondary | ICD-10-CM | POA: Diagnosis not present

## 2015-11-17 DIAGNOSIS — R300 Dysuria: Secondary | ICD-10-CM | POA: Diagnosis not present

## 2015-11-18 NOTE — Progress Notes (Signed)
  Radiation Oncology         [phone] ________________________________  Name: Jonathan Medina MRN: [id number]  Date: 11/09/2015  DOB: 1930-06-13  End of Treatment Note   ICD-9-CM ICD-10-CM    1. Prostate cancer ([place]) 185 C61     DIAGNOSIS: Stage T1c Adenocarcinoma of the Prostate, Gleason Score 8 (4+4), PSA (37.8), and prostate volume of 73 mL     Indication for treatment:  Curative       Radiation treatment dates:   09/10/2015-11/09/2015  Site/dose:    1. The prostate was treated to 30 Gy in 15 fractions at 2 Gy per fraction. (75 Gy cumulative) 2. The pelvis/prostate was treated to 45 Gy in 25 fractions at 1.8 Gy per fraction.  Beams/energy:    1. IMRT/VMAT (deprecated) // 6X 2. IMRT/VMAT (deprecated) // 6X  Narrative: The patient tolerated radiation treatment relatively well.  He experienced hematuria and urinary frequency. He did not experience bowel issues or fatigue.   Plan: The patient has completed radiation treatment. The patient will return to radiation oncology clinic for routine followup in one month. I advised them to call or return sooner if they have any questions or concerns related to their recovery or treatment.  -----------------------------------  Blair Promise, PhD, MD  This document serves as a record of services personally performed by Jonathan Pray, MD. It was created on his behalf by Arlyce Harman, a trained medical scribe. The creation of this record is based on the scribe's personal observations and the provider's statements to them. This document has been checked and approved by the attending provider.

## 2015-11-24 DIAGNOSIS — R300 Dysuria: Secondary | ICD-10-CM | POA: Diagnosis not present

## 2015-11-24 DIAGNOSIS — C61 Malignant neoplasm of prostate: Secondary | ICD-10-CM | POA: Diagnosis not present

## 2015-11-24 DIAGNOSIS — Z0000 Encounter for general adult medical examination without abnormal findings: Secondary | ICD-10-CM | POA: Diagnosis not present

## 2015-11-24 DIAGNOSIS — R310 Gross hematuria: Secondary | ICD-10-CM | POA: Diagnosis not present

## 2015-11-24 DIAGNOSIS — R3989 Other symptoms and signs involving the genitourinary system: Secondary | ICD-10-CM | POA: Diagnosis not present

## 2015-12-15 DIAGNOSIS — Z5111 Encounter for antineoplastic chemotherapy: Secondary | ICD-10-CM | POA: Diagnosis not present

## 2015-12-15 DIAGNOSIS — C61 Malignant neoplasm of prostate: Secondary | ICD-10-CM | POA: Diagnosis not present

## 2015-12-15 LAB — PSA: PSA: 2

## 2015-12-21 ENCOUNTER — Encounter: Payer: Self-pay | Admitting: Oncology

## 2015-12-23 DIAGNOSIS — C61 Malignant neoplasm of prostate: Secondary | ICD-10-CM | POA: Diagnosis not present

## 2015-12-23 DIAGNOSIS — N3021 Other chronic cystitis with hematuria: Secondary | ICD-10-CM | POA: Diagnosis not present

## 2015-12-23 DIAGNOSIS — R3982 Chronic bladder pain: Secondary | ICD-10-CM | POA: Diagnosis not present

## 2015-12-24 ENCOUNTER — Other Ambulatory Visit: Payer: Self-pay | Admitting: Urology

## 2015-12-24 ENCOUNTER — Encounter: Payer: Self-pay | Admitting: Radiation Oncology

## 2015-12-24 ENCOUNTER — Encounter (HOSPITAL_COMMUNITY)
Admission: RE | Admit: 2015-12-24 | Discharge: 2015-12-24 | Disposition: A | Discharge status: Home / Self Care | Payer: Medicare Other | Source: Ambulatory Visit | Attending: Urology | Admitting: Urology

## 2015-12-24 ENCOUNTER — Ambulatory Visit
Admission: RE | Admit: 2015-12-24 | Discharge: 2015-12-24 | Disposition: A | Discharge status: Home / Self Care | Payer: Medicare Other | Source: Ambulatory Visit | Attending: Radiation Oncology | Admitting: Radiation Oncology

## 2015-12-24 ENCOUNTER — Encounter (HOSPITAL_COMMUNITY): Payer: Self-pay

## 2015-12-24 VITALS — BP 138/77 | HR 81 | Temp 98.0°F | Ht 68.0 in | Wt 210.4 lb

## 2015-12-24 DIAGNOSIS — Z79899 Other long term (current) drug therapy: Secondary | ICD-10-CM | POA: Diagnosis not present

## 2015-12-24 DIAGNOSIS — C61 Malignant neoplasm of prostate: Secondary | ICD-10-CM | POA: Diagnosis not present

## 2015-12-24 DIAGNOSIS — Z7982 Long term (current) use of aspirin: Secondary | ICD-10-CM | POA: Insufficient documentation

## 2015-12-24 DIAGNOSIS — Z888 Allergy status to other drugs, medicaments and biological substances status: Secondary | ICD-10-CM | POA: Insufficient documentation

## 2015-12-24 DIAGNOSIS — Z923 Personal history of irradiation: Secondary | ICD-10-CM | POA: Insufficient documentation

## 2015-12-24 DIAGNOSIS — Z91013 Allergy to seafood: Secondary | ICD-10-CM | POA: Insufficient documentation

## 2015-12-24 LAB — CBC
HEMATOCRIT: 32.6 % — AB (ref 39.0–52.0)
HEMOGLOBIN: 11.1 g/dL — AB (ref 13.0–17.0)
MCH: 30.6 pg (ref 26.0–34.0)
MCHC: 34 g/dL (ref 30.0–36.0)
MCV: 89.8 fL (ref 78.0–100.0)
Platelets: 286 10*3/uL (ref 150–400)
RBC: 3.63 MIL/uL — ABNORMAL LOW (ref 4.22–5.81)
RDW: 12.1 % (ref 11.5–15.5)
WBC: 7 10*3/uL (ref 4.0–10.5)

## 2015-12-24 LAB — BASIC METABOLIC PANEL
Anion gap: 8 (ref 5–15)
BUN: 30 mg/dL — AB (ref 6–20)
CO2: 24 mmol/L (ref 22–32)
Calcium: 9.3 mg/dL (ref 8.9–10.3)
Chloride: 106 mmol/L (ref 101–111)
Creatinine, Ser: 1.07 mg/dL (ref 0.61–1.24)
GFR calc Af Amer: >60 mL/min (ref >60)
GFR calc non Af Amer: >60 mL/min (ref >60)
GLUCOSE: 98 mg/dL (ref 65–99)
POTASSIUM: 4.9 mmol/L (ref 3.5–5.1)
Sodium: 138 mmol/L (ref 135–145)

## 2015-12-24 NOTE — Progress Notes (Signed)
Jonathan Medina here for follow up after treatment to his prostate.  He reports having severe pain with urination and has urinary frequency.  He reports having hematuria and passing bladder tissue.  He is going to have surgery on Tuesday by [name].  He is not taking any pain medication because of constipation.  He reports having "no energy at all."  BP 138/77 mmHg  Pulse 81  Temp(Src) 98 F (36.7 C) (Oral)  Ht '5\' 8"'$  (1.727 m)  Wt 210 lb 6.4 oz (95.437 kg)  BMI 32.00 kg/m2   Wt Readings from Last 3 Encounters:  12/24/15 210 lb 6.4 oz (95.437 kg)  12/24/15 209 lb (94.802 kg)  11/03/15 217 lb 14.4 oz (98.839 kg)

## 2015-12-24 NOTE — Progress Notes (Signed)
Radiation Oncology         [phone] ________________________________  Name: Jonathan Medina MRN: [id number]  Date: 12/24/2015  DOB: 1929-11-20  Follow-Up Visit Note  CC: [name], MD  Irine Seal, MD    ICD-9-CM ICD-10-CM   1. Prostate cancer ([place]) 185 C61     Diagnosis: Stage T1c Adenocarcinoma of the Prostate, Gleason Score 8 (4+4), PSA (37.8), and prostate volume of 73 mL  Interval Since Last Radiation: 6 weeks  09/10/2015-11/09/2015:  1. The prostate was treated to 30 Gy in 15 fractions at 2 Gy per fraction. (75 Gy cumulative) 2. The pelvis/prostate was treated to 45 Gy in 25 fractions at 1.8 Gy per fraction.  Narrative:  The patient returns today for routine follow-up. He reports having severe pain with urination and has urinary frequency. He reports having hematuria and passing bladder tissue. Per Dr. Ralene Muskrat noted from 11/24/15, "He has known chronic right bladder wall inflammmation with a small bladder capacity. Tramadol helps, but constipates him. He has tried Uribel, Myrbetriq, and Azo without success." Dr. Jeffie Pollock subsequently placed him on Gabapentin 300 mg tid for the pain. He is going to have surgery on Tuesday by Dr. Jeffie Pollock. He is not taking any pain medication because of constipation. He reports having "no energy at all."  ALLERGIES:  is allergic to epinephrine; shellfish allergy; betadine; duloxetine; gabapentin; ivp dye; nisoldipine; simvastatin; statins; and tramadol.  Meds: Current Outpatient Prescriptions  Medication Sig Dispense Refill  . amLODipine (NORVASC) 10 MG tablet TAKE 1 TABLET DAILY IN THE MORNING 90 tablet 3  . carvedilol (COREG CR) 20 MG 24 hr capsule TAKE 1 CAPSULE EVERY       MORNING (Patient taking differently: Take 20 mg by mouth daily. TAKE 1 CAPSULE EVERY       MORNING) 90 capsule 3  . fexofenadine (ALLEGRA) 180 MG tablet Take 180 mg by mouth daily as needed for allergies or rhinitis.    Marland Kitchen losartan (COZAAR) 100 MG tablet TAKE 1 TABLET EVERY  MORNING (Patient taking differently: TAKE '100mg'$  by mouth once EVERY MORNING) 90 tablet 1  . Tamsulosin HCl (FLOMAX) 0.4 MG CAPS Take 0.4 mg by mouth daily after breakfast.     . aspirin EC 81 MG tablet Take 81 mg by mouth daily. Reported on 12/24/2015    . Calcium Carb-Cholecalciferol (CALCIUM 1000 + D PO) Take 1 tablet by mouth daily. Reported on 12/24/2015    . phenazopyridine (PYRIDIUM) 100 MG tablet Take 1 tablet (100 mg total) by mouth 3 (three) times daily as needed for pain. Painful urination (Patient not taking: Reported on 11/03/2015) 30 tablet 1  . traMADol (ULTRAM) 50 MG tablet Take 1 tablet (50 mg total) by mouth every 6 (six) hours as needed for moderate pain. (Patient not taking: Reported on 11/03/2015) 30 tablet 0   No current facility-administered medications for this encounter.    Physical Findings: The patient is in no acute distress. Patient is alert and oriented.  height is '5\' 8"'$  (1.727 m) and weight is 210 lb 6.4 oz (95.437 kg). His oral temperature is 98 F (36.7 C). His blood pressure is 138/77 and his pulse is 81.   The patient is ambulatory with a cane. Lungs are clear to auscultation bilaterally. Heart has regular rate and rhythm. No palpable cervical, supraclavicular, or axillary adenopathy.  Lab Findings: Lab Results  Component Value Date   WBC 7.0 12/24/2015   HGB 11.1* 12/24/2015   HCT 32.6* 12/24/2015   MCV 89.8  12/24/2015   PLT 286 12/24/2015    Radiographic Findings: No results found.  Impression:  The patient is recovering from the effects of radiation. The patient continues to have urinary difficulties that were present before radiation. Dr. Jeffie Pollock will be doing a cystoscopy next Tuesday. The patient reports his PSA is down to 2.  Plan: The patient will follow up in 6 months.  ____________________________________ -----------------------------------  Blair Promise, PhD, MD  This document serves as a record of services personally performed by Gery Pray, MD. It was created on his behalf by Darcus Austin, a trained medical scribe. The creation of this record is based on the scribe's personal observations and the provider's statements to them. This document has been checked and approved by the attending provider.

## 2015-12-24 NOTE — Patient Instructions (Signed)
KAHEEM HALLECK  12/24/2015   Your procedure is scheduled on: Tuesday 12/29/2015  Report to [facility] Main  Entrance take [place]  elevators to 3rd floor to  [place] at   [place] AM.  Call this number if you have problems the morning of surgery [phone]   Remember: ONLY 1 PERSON MAY GO WITH YOU TO SHORT STAY TO GET  READY MORNING OF [place].   Do not eat food or drink liquids :After Midnight.     Take these medicines the morning of surgery with A SIP OF WATER: Carvedilol, Amlodipine              PLEASE BRING CPAP MASK AND TUBING WITH YOU TO HOSPITAL MORNING OF SURGERY!                               You may not have any metal on your body including hair pins and              piercings  Do not wear jewelry, make-up, lotions, powders or perfumes, deodorant             Do not wear nail polish.  Do not shave  48 hours prior to surgery.              Men may shave face and neck.   Do not bring valuables to the hospital. [place].  Contacts, dentures or bridgework may not be worn into surgery.  Leave suitcase in the car. After surgery it may be brought to your room.                  Please read over the following fact sheets you were given: _____________________________________________________________________             [facility] - Preparing for Surgery Before surgery, you can play an important role.  Because skin is not sterile, your skin needs to be as free of germs as possible.  You can reduce the number of germs on your skin by washing with CHG (chlorahexidine gluconate) soap before surgery.  CHG is an antiseptic cleaner which kills germs and bonds with the skin to continue killing germs even after washing. Please DO NOT use if you have an allergy to CHG or antibacterial soaps.  If your skin becomes reddened/irritated stop using the CHG and inform your nurse when you arrive at Short Stay. Do  not shave (including legs and underarms) for at least 48 hours prior to the first CHG shower.  You may shave your face/neck. Please follow these instructions carefully:  1.  Shower with CHG Soap the night before surgery and the  morning of Surgery.  2.  If you choose to wash your hair, wash your hair first as usual with your  normal  shampoo.  3.  After you shampoo, rinse your hair and body thoroughly to remove the  shampoo.                           4.  Use CHG as you would any other liquid soap.  You can apply chg directly  to the skin and wash  Gently with a scrungie or clean washcloth.  5.  Apply the CHG Soap to your body ONLY FROM THE NECK DOWN.   Do not use on face/ open                           Wound or open sores. Avoid contact with eyes, ears mouth and genitals (private parts).                       Wash face,  Genitals (private parts) with your normal soap.             6.  Wash thoroughly, paying special attention to the area where your surgery  will be performed.  7.  Thoroughly rinse your body with warm water from the neck down.  8.  DO NOT shower/wash with your normal soap after using and rinsing off  the CHG Soap.                9.  Pat yourself dry with a clean towel.            10.  Wear clean pajamas.            11.  Place clean sheets on your bed the night of your first shower and do not  sleep with pets. Day of Surgery : Do not apply any lotions/deodorants the morning of surgery.  Please wear clean clothes to the hospital/surgery center.  FAILURE TO FOLLOW THESE INSTRUCTIONS MAY RESULT IN THE CANCELLATION OF YOUR SURGERY PATIENT SIGNATURE_________________________________  NURSE SIGNATURE__________________________________  ________________________________________________________________________

## 2015-12-24 NOTE — Addendum Note (Signed)
Encounter addended by: Jacqulyn Liner, RN on: 12/24/2015  4:44 PM<BR>     Documentation filed: Charges VN

## 2015-12-28 NOTE — H&P (Signed)
CC/HPI: I have prostate cancer.     Jonathan Medina returns today in f/u. He has chronic bladder inflammation and a history of prostate cancer. he has received EXRT for the prostate cancer and he remains on firmagon and got his 6th dose last week. he has persistent hematuria and passes clots and tissue. He finished RTx on May 8 and has been bleeding since about May 1. His PSA is 2. His main complete is bladder pain. He gets some relief with the clot passage but he hasn't been able to tolerate the pain meds. He is unable to sleep because of the pain.     ALLERGIES: EPINEPHrine SOLN Iodine SOLN Myrbetriq TB24 nisoldipine Shellfish Statins     MEDICATIONS: Tamsulosin Hcl 0.4 mg capsule, ext release 24 hr 1 capsule PO Daily  Coreg Cr 20 mg capsule,extended release multiphase 24hr  Cozaar 50 mg tablet  Norvasc 10 mg tablet     GU PSH: Cysto Bladder Ureth Biopsy - 06/04/2015 Cystoscopy TURBT 2-5 cm - 06/04/2015 Prostate Needle Biopsy - 06/04/2015, 03/28/2014, 2010/09/15      PSH Notes: Cystoscopy With Biopsy, Needle Biopsy Of Prostate, Cystoscopy With Fulguration Medium Lesion (2-5cm), Biopsy Of The Prostate Needle, Forearm Nonunion Repair, Biopsy Of The Prostate Needle, Surgery Epididymis Excision Of Spermatocele, Lung Surgery, Complete Colonoscopy, Tonsillectomy   NON-GU PSH: Diagnostic Colonoscopy - 2006/09/15 Lung Surgery (Unspecified) - September 15, 2007 Remove Tonsils - 09/15/06    GU PMH: Prostate Cancer - 12/15/2015, Prostate Cancer, Prostate cancer - 11/24/2015 Dysuria, Dysuria - 11/24/2015 Gross hematuria, Gross hematuria - 11/24/2015 Oth GU systems Signs/Symptoms, Bladder pain - 11/24/2015 Other microscopic hematuria, Microscopic hematuria - 11/17/2015 Nocturia, Nocturia - 07/23/2015 Urinary Frequency, Urinary frequency - 07/23/2015 Elevated PSA, Elevated prostate specific antigen (PSA) - 05/04/2015 BPH w/LUTS, Benign prostatic hyperplasia with urinary obstruction - 03/17/2015 Bladder disorder, Unspec, Bladder  disorder - 01/07/2015 Testicular hypofunction, Hypogonadism, testicular - 12/08/2014 Inflammatory Disease Prostate, Unspec, Prostatitis - 10/22/2014 Urinary Tract Inf, Unspec site, Pyuria - 01/01/2014 Personal Hx Urinary Tract Infections, History of urinary tract infection - 09/14/13 Carcinoma in situ of prostate, PIN III (prostatic intraepithelial neoplasia III) - 2012-09-14 ED, arterial insufficiency, Erectile dysfunction due to arterial insufficiency - 09/14/12    NON-GU PMH: Encounter for general adult medical examination without abnormal findings, Encounter for preventive health examination - 03/20/2014 Chronic obstructive pulmonary disease, unspecified, Chronic Obstructive Pulmonary Disease - 09-14-2012 Gout, unspecified, Gout - 14-Sep-2012 Hereditary and idiopathic neuropathy, unspecified, Idiopathic Peripheral Neuropathy - 09-14-2012 Personal history of other diseases of the circulatory system, History of hypertension - Sep 14, 2012 Personal history of other endocrine, nutritional and metabolic disease, History of hypercholesterolemia - 2012/09/14 , Lung Cancer - 09-15-2010, Benign Polyps Of The Large Intestine, September 15, 2006    FAMILY HISTORY: Death In The Family Father - Father Death In The Family Mother - Mother Family Health Status Number Of Children - Runs In Family Prostate Cancer - Father   SOCIAL HISTORY: None    Notes: Previous History Of Smoking, Caffeine Use, Marital History - Currently Married, Alcohol Use, Retired From Work   REVIEW OF SYSTEMS:    GU Review Male:   Patient reports hard to postpone urination, burning/ pain with urination, frequent urination, and get up at night to urinate. Patient denies erection problems, penile pain, leakage of urine, have to strain to urinate , trouble starting your stream, and stream starts and stops.  Gastrointestinal (Upper):   Patient denies nausea, vomiting, and indigestion/ heartburn.  Gastrointestinal (Lower):   Patient denies diarrhea and constipation.  Constitutional:   Patient reports  weight loss and fatigue. Patient denies fever and night sweats.  Skin:   Patient denies skin rash/ lesion and itching.  Eyes:   Patient denies blurred vision and double vision.  Ears/ Nose/ Throat:   Patient denies sore throat and sinus problems.  Hematologic/Lymphatic:   Patient denies swollen glands and easy bruising.  Cardiovascular:   Patient denies leg swelling and chest pains.  Respiratory:   Patient denies cough and shortness of breath.  Endocrine:   Patient denies excessive thirst.  Musculoskeletal:   Patient denies back pain and joint pain.  Neurological:   Patient denies headaches and dizziness.  Psychologic:   Patient reports depression and anxiety.    VITAL SIGNS:    BP: 134/77 mmHg   Heart Rate: 69 /min   Temp: 98.2 F / 37 C      MULTI-SYSTEM PHYSICAL EXAMINATION:    Constitutional: Well-nourished. No physical deformities. somewhat ill appearing elderly BM.  Respiratory: No labored breathing, no use of accessory muscles.   Cardiovascular: Normal temperature, normal extremity pulses, no swelling, no varicosities.  Gastrointestinal: Abdominal tenderness, obese. No mass, no rigidity. The tenderness in the suprapubic area.    PAST DATA REVIEWED:  Source Of History:  Patient  Urine Test Review:   Urinalysis   12/15/15  PSA  Total PSA 2.00    Notes:                     He has >60 RBC's today.   PROCEDURES:          Urinalysis w/Scope - 81001 Dipstick Dipstick Cont'd Micro  Specimen: Voided Bilirubin: Neg WBC/hpf: 0-5/hpf  Color: Amber Ketones: Neg RBC/hpf: >60/hpf  Appearance: Clear Blood: 3+ Bacteria: NS (Not Seen)  Specific Gravity: 1.020 Protein: 1+ Cystals: NS (Not Seen)  pH: 6.0 Urobilinogen: 0.2 Casts: NS (Not Seen)  Glucose: Neg Nitrites: Neg Trichomonas: Not Present    Leukocyte Esterase: Neg Mucous: Not Present      Epithelial Cells: 0-5/hpf      Yeast: NS (Not Seen)      Sperm: Not Present    ASSESSMENT:      ICD-10 Details  1 GU:   Prostate Cancer  - C61 Stable - His PSA is down to 2.00  2   Recurrent Cystitis w/ hematuria - N30.21 Worsening - He has increased bleeding from the bladder following radiation.   3   Chronic bladder pain - R39.82 Worsening - The pain has become for severe.   PLAN:           Schedule Return Visit: ASAP - Schedule Surgery  Return Notes: He has increased bladder pain and hematuria and after discussing the options, I am going to get him set up for cystoscopy with HOD and fulguration. I have reviewed the risks of bleeding, infection, bladder and ureteral injury, failure to improve the pain, thrombotic events and anesthetic complications.           Document Letter(s):  Created for Patient: Clinical Summary

## 2015-12-29 ENCOUNTER — Encounter (HOSPITAL_COMMUNITY)
Admission: RE | Disposition: A | Discharge status: Home / Self Care | Payer: Self-pay | Source: Ambulatory Visit | Attending: Urology

## 2015-12-29 ENCOUNTER — Encounter (HOSPITAL_COMMUNITY): Payer: Self-pay

## 2015-12-29 ENCOUNTER — Ambulatory Visit (HOSPITAL_COMMUNITY): Payer: Medicare Other | Admitting: Anesthesiology

## 2015-12-29 ENCOUNTER — Ambulatory Visit (HOSPITAL_COMMUNITY)
Admission: RE | Admit: 2015-12-29 | Discharge: 2015-12-29 | Disposition: A | Discharge status: Home / Self Care | Payer: Medicare Other | Source: Ambulatory Visit | Attending: Urology | Admitting: Urology

## 2015-12-29 DIAGNOSIS — N329 Bladder disorder, unspecified: Secondary | ICD-10-CM | POA: Diagnosis not present

## 2015-12-29 DIAGNOSIS — N138 Other obstructive and reflux uropathy: Secondary | ICD-10-CM | POA: Insufficient documentation

## 2015-12-29 DIAGNOSIS — Z923 Personal history of irradiation: Secondary | ICD-10-CM | POA: Diagnosis not present

## 2015-12-29 DIAGNOSIS — Z79899 Other long term (current) drug therapy: Secondary | ICD-10-CM | POA: Diagnosis not present

## 2015-12-29 DIAGNOSIS — Z8546 Personal history of malignant neoplasm of prostate: Secondary | ICD-10-CM | POA: Insufficient documentation

## 2015-12-29 DIAGNOSIS — Z87891 Personal history of nicotine dependence: Secondary | ICD-10-CM | POA: Diagnosis not present

## 2015-12-29 DIAGNOSIS — N359 Urethral stricture, unspecified: Secondary | ICD-10-CM | POA: Diagnosis not present

## 2015-12-29 DIAGNOSIS — N3011 Interstitial cystitis (chronic) with hematuria: Secondary | ICD-10-CM | POA: Insufficient documentation

## 2015-12-29 DIAGNOSIS — G609 Hereditary and idiopathic neuropathy, unspecified: Secondary | ICD-10-CM | POA: Diagnosis not present

## 2015-12-29 DIAGNOSIS — N401 Enlarged prostate with lower urinary tract symptoms: Secondary | ICD-10-CM | POA: Diagnosis not present

## 2015-12-29 DIAGNOSIS — J449 Chronic obstructive pulmonary disease, unspecified: Secondary | ICD-10-CM | POA: Insufficient documentation

## 2015-12-29 DIAGNOSIS — R3915 Urgency of urination: Secondary | ICD-10-CM | POA: Diagnosis not present

## 2015-12-29 DIAGNOSIS — G4730 Sleep apnea, unspecified: Secondary | ICD-10-CM | POA: Diagnosis not present

## 2015-12-29 DIAGNOSIS — Z7982 Long term (current) use of aspirin: Secondary | ICD-10-CM | POA: Diagnosis not present

## 2015-12-29 DIAGNOSIS — Z85118 Personal history of other malignant neoplasm of bronchus and lung: Secondary | ICD-10-CM | POA: Diagnosis not present

## 2015-12-29 DIAGNOSIS — R350 Frequency of micturition: Secondary | ICD-10-CM | POA: Diagnosis not present

## 2015-12-29 DIAGNOSIS — I10 Essential (primary) hypertension: Secondary | ICD-10-CM | POA: Diagnosis not present

## 2015-12-29 HISTORY — PX: CYSTO WITH HYDRODISTENSION: SHX5453

## 2015-12-29 SURGERY — CYSTOSCOPY, WITH BLADDER HYDRODISTENSION
Anesthesia: General

## 2015-12-29 MED ORDER — FENTANYL CITRATE (PF) 100 MCG/2ML IJ SOLN: 25.0000 ug | INTRAMUSCULAR | Status: DC | PRN

## 2015-12-29 MED ORDER — FENTANYL CITRATE (PF) 100 MCG/2ML IJ SOLN
INTRAMUSCULAR | Status: AC
  Filled 2015-12-29: qty 2

## 2015-12-29 MED ORDER — DIATRIZOATE MEGLUMINE 30 % UR SOLN
URETHRAL | Status: AC
  Filled 2015-12-29: qty 100

## 2015-12-29 MED ORDER — ACETAMINOPHEN 650 MG RE SUPP
650.0000 mg | RECTAL | Status: DC | PRN
  Filled 2015-12-29: qty 1

## 2015-12-29 MED ORDER — DEXAMETHASONE SODIUM PHOSPHATE 10 MG/ML IJ SOLN
INTRAMUSCULAR | Status: DC | PRN
  Administered 2015-12-29: 10 mg via INTRAVENOUS

## 2015-12-29 MED ORDER — CEFAZOLIN SODIUM-DEXTROSE 2-4 GM/100ML-% IV SOLN
INTRAVENOUS | Status: AC
  Filled 2015-12-29: qty 100

## 2015-12-29 MED ORDER — FENTANYL CITRATE (PF) 100 MCG/2ML IJ SOLN
INTRAMUSCULAR | Status: AC
Start: 2015-12-29 — End: 2015-12-29
  Filled 2015-12-29: qty 2

## 2015-12-29 MED ORDER — OXYCODONE HCL 5 MG PO TABS
5.0000 mg | ORAL_TABLET | ORAL | Status: DC | PRN
  Administered 2015-12-29: 5 mg via ORAL
  Filled 2015-12-29 (×2): qty 1

## 2015-12-29 MED ORDER — FENTANYL CITRATE (PF) 100 MCG/2ML IJ SOLN
INTRAMUSCULAR | Status: DC | PRN
  Administered 2015-12-29 (×2): 50 ug via INTRAVENOUS
  Administered 2015-12-29: 100 ug via INTRAVENOUS

## 2015-12-29 MED ORDER — PHENYLEPHRINE 40 MCG/ML (10ML) SYRINGE FOR IV PUSH (FOR BLOOD PRESSURE SUPPORT)
PREFILLED_SYRINGE | INTRAVENOUS | Status: AC
  Filled 2015-12-29: qty 10

## 2015-12-29 MED ORDER — PHENAZOPYRIDINE HCL 200 MG PO TABS
ORAL_TABLET | Freq: Once | ORAL | Status: AC
Start: 2015-12-29 — End: 2015-12-29
  Administered 2015-12-29: 15 mL via INTRAVESICAL
  Filled 2015-12-29: qty 15

## 2015-12-29 MED ORDER — TAPENTADOL HCL 50 MG PO TABS: 50.0000 mg | ORAL_TABLET | Freq: Four times a day (QID) | ORAL | Status: DC | PRN

## 2015-12-29 MED ORDER — PHENYLEPHRINE HCL 10 MG/ML IJ SOLN
INTRAMUSCULAR | Status: DC | PRN
  Administered 2015-12-29: 80 ug via INTRAVENOUS

## 2015-12-29 MED ORDER — ONDANSETRON HCL 4 MG/2ML IJ SOLN
INTRAMUSCULAR | Status: DC | PRN
  Administered 2015-12-29: 4 mg via INTRAVENOUS

## 2015-12-29 MED ORDER — PROPOFOL 10 MG/ML IV BOLUS
INTRAVENOUS | Status: DC | PRN
  Administered 2015-12-29: 150 mg via INTRAVENOUS

## 2015-12-29 MED ORDER — SODIUM CHLORIDE 0.9 % IV SOLN: 250.0000 mL | INTRAVENOUS | Status: DC | PRN

## 2015-12-29 MED ORDER — FENTANYL CITRATE (PF) 100 MCG/2ML IJ SOLN
25.0000 ug | INTRAMUSCULAR | Status: DC | PRN
  Administered 2015-12-29 (×3): 50 ug via INTRAVENOUS

## 2015-12-29 MED ORDER — STERILE WATER FOR IRRIGATION IR SOLN
Status: DC | PRN
  Administered 2015-12-29: 4000 mL

## 2015-12-29 MED ORDER — PROPOFOL 10 MG/ML IV BOLUS
INTRAVENOUS | Status: AC
  Filled 2015-12-29: qty 20

## 2015-12-29 MED ORDER — CEFAZOLIN SODIUM-DEXTROSE 2-4 GM/100ML-% IV SOLN
2.0000 g | INTRAVENOUS | Status: AC
  Administered 2015-12-29: 2 g via INTRAVENOUS
  Filled 2015-12-29: qty 100

## 2015-12-29 MED ORDER — SODIUM CHLORIDE 0.9% FLUSH: 3.0000 mL | INTRAVENOUS | Status: DC | PRN

## 2015-12-29 MED ORDER — BUPIVACAINE HCL (PF) 0.5 % IJ SOLN: Freq: Once | INTRAMUSCULAR | Status: DC

## 2015-12-29 MED ORDER — PROMETHAZINE HCL 25 MG/ML IJ SOLN: 6.2500 mg | INTRAMUSCULAR | Status: DC | PRN

## 2015-12-29 MED ORDER — SODIUM CHLORIDE 0.9% FLUSH: 3.0000 mL | Freq: Two times a day (BID) | INTRAVENOUS | Status: DC

## 2015-12-29 MED ORDER — ONDANSETRON HCL 4 MG/2ML IJ SOLN
INTRAMUSCULAR | Status: AC
  Filled 2015-12-29: qty 2

## 2015-12-29 MED ORDER — DEXAMETHASONE SODIUM PHOSPHATE 10 MG/ML IJ SOLN
INTRAMUSCULAR | Status: AC
  Filled 2015-12-29: qty 1

## 2015-12-29 MED ORDER — LACTATED RINGERS IV SOLN
INTRAVENOUS | Status: DC | PRN
  Administered 2015-12-29: 07:00:00 via INTRAVENOUS

## 2015-12-29 MED ORDER — LIDOCAINE HCL (CARDIAC) 20 MG/ML IV SOLN
INTRAVENOUS | Status: DC | PRN
  Administered 2015-12-29: 100 mg via INTRAVENOUS

## 2015-12-29 MED ORDER — ACETAMINOPHEN 325 MG PO TABS: 650.0000 mg | ORAL_TABLET | ORAL | Status: DC | PRN

## 2015-12-29 SURGICAL SUPPLY — 21 items
BAG URO CATCHER STRL LF (MISCELLANEOUS) ×2 IMPLANT
CATH FOLEY 2WAY 5CC 16FR (CATHETERS) ×2
CATH FOLEY 2WAY 5CC 20FR (CATHETERS) ×2 IMPLANT
CATH FOLEY 2WAY SLVR  5CC 16FR (CATHETERS) ×1
CATH FOLEY 2WAY SLVR 5CC 16FR (CATHETERS) IMPLANT
CATH ROBINSON RED A/P 16FR (CATHETERS) ×1 IMPLANT
CATH ROBINSON RED A/P 18FR (CATHETERS) IMPLANT
CATH URET 5FR 28IN OPEN ENDED (CATHETERS) IMPLANT
CATH URTH STD 16FR FL 2W DRN (CATHETERS) IMPLANT
CYSTOGRAFIN 30% 250ML (MISCELLANEOUS) IMPLANT
GLOVE SURG SS PI 8.0 STRL IVOR (GLOVE) IMPLANT
GOWN STRL REUS W/TWL XL LVL3 (GOWN DISPOSABLE) ×2 IMPLANT
MANIFOLD NEPTUNE II (INSTRUMENTS) ×2 IMPLANT
NDL SAFETY ECLIPSE 18X1.5 (NEEDLE) IMPLANT
NEEDLE HYPO 18GX1.5 SHARP (NEEDLE)
NEEDLE HYPO 22GX1.5 SAFETY (NEEDLE) IMPLANT
PACK CYSTO (CUSTOM PROCEDURE TRAY) ×2 IMPLANT
PLUG CATH AND CAP STER (CATHETERS) ×3 IMPLANT
SYR BULB IRRIGATION 50ML (SYRINGE) IMPLANT
TUBING CONNECTING 10 (TUBING) IMPLANT
WATER STERILE IRR 1500ML POUR (IV SOLUTION) IMPLANT

## 2015-12-29 NOTE — Progress Notes (Signed)
Patient has been up to BR x 2 to attempt to void. Just feeling pressure. " like needs to pee".  Steady on feet and denies dizziness

## 2015-12-29 NOTE — Progress Notes (Signed)
Patient may go to Short Stay- does not have to stay in PACU 2 hours- per Dr. Kalman Shan

## 2015-12-29 NOTE — Brief Op Note (Signed)
12/29/2015  8:06 AM  PATIENT:  Jonathan Medina  82 y.o. male  PRE-OPERATIVE DIAGNOSIS:  BLADDER PAIN AND BLEEDING   POST-OPERATIVE DIAGNOSIS:  BLADDER PAIN AND BLEEDING with Urethral stricture and IC  PROCEDURE:  Procedure(s): CYSTOSCOPY HYDRODISTENSION AND FULGERATION  (N/A) INSTILLATION OF PYRIDIUM AND MARCAINE  SURGEON:  Surgeon(s) and Role:    * Irine Seal, MD - Primary  PHYSICIAN ASSISTANT:   ASSISTANTS: none   ANESTHESIA:   general  EBL:  Total I/O In: 700 [I.V.:700] Out: -   BLOOD ADMINISTERED:none  DRAINS: Urinary Catheter (Foley)   LOCAL MEDICATIONS USED:  MARCAINE    and Amount: 15 ml  SPECIMEN:  No Specimen  DISPOSITION OF SPECIMEN:  N/A  COUNTS:  YES  TOURNIQUET:  * No tourniquets in log *  DICTATION: .Other Dictation: Dictation Number [id number]  PLAN OF CARE: Discharge to home after PACU  PATIENT DISPOSITION:  PACU - hemodynamically stable.   Delay start of Pharmacological VTE agent (>24hrs) due to surgical blood loss or risk of bleeding: not applicable

## 2015-12-29 NOTE — Anesthesia Preprocedure Evaluation (Signed)
Anesthesia Evaluation  Patient identified by MRN, date of birth, ID band Patient awake    Reviewed: Allergy & Precautions, NPO status , Patient's Chart, lab work & pertinent test results  Airway Mallampati: II  TM Distance: >3 FB Neck ROM: Full    Dental no notable dental hx.    Pulmonary sleep apnea , COPD, former smoker,    Pulmonary exam normal breath sounds clear to auscultation       Cardiovascular hypertension, Pt. on medications Normal cardiovascular exam Rhythm:Regular Rate:Normal     Neuro/Psych negative neurological ROS  negative psych ROS   GI/Hepatic negative GI ROS, Neg liver ROS,   Endo/Other  negative endocrine ROS  Renal/GU negative Renal ROS  negative genitourinary   Musculoskeletal negative musculoskeletal ROS (+)   Abdominal   Peds negative pediatric ROS (+)  Hematology negative hematology ROS (+)   Anesthesia Other Findings   Reproductive/Obstetrics negative OB ROS                             Anesthesia Physical Anesthesia Plan  ASA: III  Anesthesia Plan: General   Post-op Pain Management:    Induction: Intravenous  Airway Management Planned: LMA  Additional Equipment:   Intra-op Plan:   Post-operative Plan: Extubation in OR  Informed Consent: I have reviewed the patients History and Physical, chart, labs and discussed the procedure including the risks, benefits and alternatives for the proposed anesthesia with the patient or authorized representative who has indicated his/her understanding and acceptance.   Dental advisory given  Plan Discussed with: CRNA and Surgeon  Anesthesia Plan Comments:         Anesthesia Quick Evaluation

## 2015-12-29 NOTE — Transfer of Care (Signed)
Immediate Anesthesia Transfer of Care Note  Patient: Jonathan Medina  Procedure(s) Performed: Procedure(s): CYSTOSCOPY HYDRODISTENSION AND FULGERATION , INSTILLATION OF mARCAINE AND pYRIDIUM (N/A)  Patient Location: PACU  Anesthesia Type:General  Level of Consciousness: sedated  Airway & Oxygen Therapy: Patient Spontanous Breathing and Patient connected to face mask oxygen  Post-op Assessment: Report given to RN and Post -op Vital signs reviewed and stable  Post vital signs: Reviewed and stable  Last Vitals:  Filed Vitals:   12/29/15 0551  BP: 116/77  Pulse: 87  Temp: 37.1 C  Resp: 18    Last Pain:  Filed Vitals:   12/29/15 0553  PainSc: 0-No pain         Complications: No apparent anesthesia complications

## 2015-12-29 NOTE — Anesthesia Postprocedure Evaluation (Signed)
Anesthesia Post Note  Patient: Jonathan Medina  Procedure(s) Performed: Procedure(s) (LRB): CYSTOSCOPY HYDRODISTENSION AND FULGERATION , INSTILLATION OF mARCAINE AND pYRIDIUM (N/A)  Patient location during evaluation: PACU Anesthesia Type: General Level of consciousness: awake and alert Pain management: pain level controlled Vital Signs Assessment: post-procedure vital signs reviewed and stable Respiratory status: spontaneous breathing, nonlabored ventilation, respiratory function stable and patient connected to nasal cannula oxygen Cardiovascular status: blood pressure returned to baseline and stable Postop Assessment: no signs of nausea or vomiting Anesthetic complications: no    Last Vitals:  Filed Vitals:   12/29/15 0849 12/29/15 0900  BP:  137/84  Pulse: 65 72  Temp:  36.9 C  Resp: 17 16    Last Pain:  Filed Vitals:   12/29/15 0906  PainSc: 8                  Candence Sease S

## 2015-12-29 NOTE — Discharge Instructions (Addendum)
CYSTOSCOPY HOME CARE INSTRUCTIONS  Activity: Rest for the remainder of the day.  Do not drive or operate equipment today.  You may resume normal activities in one to two days as instructed by your physician.   Meals: Drink plenty of liquids and eat light foods such as gelatin or soup this evening.  You may return to a normal meal plan tomorrow.  Return to Work: You may return to work in one to two days or as instructed by your physician.  Special Instructions / Symptoms: Call your physician if any of these symptoms occur:   -persistent or heavy bleeding  -bleeding which continues after first few urination  -large blood clots that are difficult to pass  -urine stream diminishes or stops completely  -fever equal to or higher than 101 degrees Farenheit.  -cloudy urine with a strong, foul odor  -severe pain  Females should always wipe from front to back after elimination.  You may feel some burning pain when you urinate.  This should disappear with time.  Applying moist heat to the lower abdomen or a hot tub bath may help relieve the pain. \   Patient Signature:  ________________________________________________________  Nurse's Signature:  ________________________________________________________      General Anesthesia, Adult, Care After Refer to this sheet in the next few weeks. These instructions provide you with information on caring for yourself after your procedure. Your health care provider may also give you more specific instructions. Your treatment has been planned according to current medical practices, but problems sometimes occur. Call your health care provider if you have any problems or questions after your procedure. WHAT TO EXPECT AFTER THE PROCEDURE After the procedure, it is typical to experience:  Sleepiness.  Nausea and vomiting. HOME CARE INSTRUCTIONS  For the first 24 hours after general anesthesia:  Have a responsible person with you.  Do not drive a  car. If you are alone, do not take public transportation.  Do not drink alcohol.  Do not take medicine that has not been prescribed by your health care provider.  Do not sign important papers or make important decisions.  You may resume a normal diet and activities as directed by your health care provider.  If you have questions or problems that seem related to general anesthesia, call the hospital and ask for the anesthetist or anesthesiologist on call. SEEK MEDICAL CARE IF:  You have nausea and vomiting that continue the day after anesthesia.  You develop a rash. SEEK IMMEDIATE MEDICAL CARE IF:   You have difficulty breathing.  You have chest pain.  You have any allergic problems.   This information is not intended to replace advice given to you by your health care provider. Make sure you discuss any questions you have with your health care provider.   Document Released: 09/26/2000 Document Revised: 07/11/2014 Document Reviewed: 10/19/2011 Elsevier Interactive Patient Education [employer].

## 2015-12-29 NOTE — Interval H&P Note (Signed)
History and Physical Interval Note:  12/29/2015 7:16 AM  Jonathan Medina  has presented today for surgery, with the diagnosis of BLADDER PAIN AND BLEEDING   The various methods of treatment have been discussed with the patient and family. After consideration of risks, benefits and other options for treatment, the patient has consented to  Procedure(s): CYSTOSCOPY HYDRODISTENSION AND FULGERATION  (N/A) as a surgical intervention .  The patient's history has been reviewed, patient examined, no change in status, stable for surgery.  I have reviewed the patient's chart and labs.  Questions were answered to the patient's satisfaction.     Jonathan Medina

## 2015-12-30 NOTE — Op Note (Signed)
NAMESHERRON, MUMMERT NO.:  [id number]  MEDICAL RECORD NO.:  [id number]  LOCATION:                                 FACILITY:  PHYSICIAN:  Marshall Cork. Jeffie Pollock, M.D.    DATE OF BIRTH:  January 08, 1930  DATE OF PROCEDURE:  12/29/2015 DATE OF DISCHARGE:                              OPERATIVE REPORT   PROCEDURE:  Cystoscopy with hydrodistention of the bladder, fulguration of bladder wall lesions, and instillation of Pyridium and Marcaine.  PREOPERATIVE DIAGNOSIS:  Frequency and urgency with small capacity about bladder.  POSTOPERATIVE DIAGNOSIS:  Frequency and urgency with small capacity bladder with membranous urethral stricture and interstitial cystitis.  SURGEON:  Marshall Cork. Jeffie Pollock, MD  ANESTHESIA:  General.  SPECIMEN:  None.  DRAINS:  A 16-French Coude Foley catheter.  BLOOD LOSS:  Minimal.  COMPLICATIONS:  None.  INDICATIONS:  Mr. Cerasoli is an 82 year old, African American male with a history of prostate cancer treated with prior TURP and radiation therapy.  He has also had longstanding chronic inflammation of the bladder wall, particularly on the right with frequency, urgency, and hematuria.  He has reached to the point where his frequency, urgency, and bladder pain had become unbearable and was felt that further evaluation with cystoscopy and hydrodistention were indicated.  FINDINGS OF PROCEDURE:  He was taken to the operating room where general anesthetic was induced.  He was placed in lithotomy position.  He was given Ancef.  He was fitted with PAS hose.  His perineum and genitalia were prepped with Hibiclens.  He was draped in usual sterile fashion.  Cystoscopy was performed using a 23-French scope and 30 and 70 degree lenses.  Examination revealed a normal urethra.  However, the membranous urethra did have a fairly significant stricture, it was diaphanous and I was able to pass the scope through it and disrupted stricture.  The prostatic urethra was  widely patent from prior TURP.  The mucosa had some blanching, but no neovascularity.  Inspection of the bladder revealed small inflammatory polyp at the trigone.  Ureteral orifices were unremarkable.  There were patchy areas of erythema consistent with inflammatory changes, nothing suspicious for carcinoma in situ or tumor were noted.  There was a scar on the right anterior wall from his prior procedure.  At this point, the bladder was dilated under greater than 80 cm of water pressure to capacity and then drained his capacity under initial fill was 200 mL.  The water was raised to 100 cm and filling was repeated once again with a capacity of only 200 mL. At this point, the bladder was drained and cystoscopy was repeated. Inspection revealed several areas of bleeding on the posterior wall consistent with glomerulations and there were few small mucosal cracks.  At this point, a Bugbee electrode was used to fulgurate the areas of most significant bleeding and also the inflammatory area on the right lateral wall.  At this point, the cystoscope was removed.  The stricture was well disrupted.  There was little bleeding from the stricture site, but I did not feel fulguration was prudent for concern about future scarring.  I then initially attempted a 16-French Foley placement;  however it would not pass, so a 16-French Coude was passed through the bladder and his bladder was instilled with approximately 10 mL of 0.5% Marcaine with crush Pyridium.  The initial dose of 15 mL with 400 mg crushed Pyridium was only partially instilled because initial attempt was through the 16- [place] catheter which was in poor position.  At this point, the catheter was plugged and will be left in until he reaches the PACU.  His anesthetic was reversed.  He was moved to recovery room in stable condition.  There were no complications.     Marshall Cork. Jeffie Pollock, M.D.   ______________________________ Marshall Cork.  Jeffie Pollock, M.D.    JJW/MEDQ  D:  12/29/2015  T:  12/30/2015  Job:  [id number]

## 2016-01-07 ENCOUNTER — Telehealth: Payer: Self-pay | Admitting: *Deleted

## 2016-01-07 DIAGNOSIS — G4733 Obstructive sleep apnea (adult) (pediatric): Secondary | ICD-10-CM

## 2016-01-07 DIAGNOSIS — Z9989 Dependence on other enabling machines and devices: Principal | ICD-10-CM

## 2016-01-07 NOTE — Telephone Encounter (Signed)
Order entered for new CPAP. Nothing further needed.

## 2016-01-07 NOTE — Telephone Encounter (Signed)
-----   Message from Rigoberto Noel, MD sent at 01/07/2016  1:34 AM EDT ----- CPAP 10 cm Pl send such requests in phone note - so there is a record in chart  RA ----- Message -----    From: Glean Hess, [place]: 01/06/2016  10:26 AM      To: Rigoberto Noel, MD    ----- Message -----    From: Darlina Guys    Sent: 01/04/2016  11:14 AM      To: Glean Hess, [place].  This pt has let us know that he needs a new CPAP.  Could you ask Dr. Elsworth Soho to put in an order with settings please? Thanks! Melissa

## 2016-01-12 DIAGNOSIS — R300 Dysuria: Secondary | ICD-10-CM | POA: Diagnosis not present

## 2016-01-12 DIAGNOSIS — N3021 Other chronic cystitis with hematuria: Secondary | ICD-10-CM | POA: Diagnosis not present

## 2016-01-15 DIAGNOSIS — Z5111 Encounter for antineoplastic chemotherapy: Secondary | ICD-10-CM | POA: Diagnosis not present

## 2016-01-15 DIAGNOSIS — D075 Carcinoma in situ of prostate: Secondary | ICD-10-CM | POA: Diagnosis not present

## 2016-02-16 DIAGNOSIS — D075 Carcinoma in situ of prostate: Secondary | ICD-10-CM | POA: Diagnosis not present

## 2016-02-16 DIAGNOSIS — C61 Malignant neoplasm of prostate: Secondary | ICD-10-CM | POA: Diagnosis not present

## 2016-03-01 ENCOUNTER — Ambulatory Visit (INDEPENDENT_AMBULATORY_CARE_PROVIDER_SITE_OTHER): Payer: Medicare Other | Admitting: Internal Medicine

## 2016-03-01 ENCOUNTER — Encounter: Payer: Self-pay | Admitting: Internal Medicine

## 2016-03-01 ENCOUNTER — Other Ambulatory Visit (INDEPENDENT_AMBULATORY_CARE_PROVIDER_SITE_OTHER): Payer: Medicare Other

## 2016-03-01 VITALS — BP 110/62 | HR 99 | Temp 97.7°F | Resp 16 | Ht 68.0 in | Wt 209.5 lb

## 2016-03-01 DIAGNOSIS — Z23 Encounter for immunization: Secondary | ICD-10-CM

## 2016-03-01 DIAGNOSIS — D510 Vitamin B12 deficiency anemia due to intrinsic factor deficiency: Secondary | ICD-10-CM

## 2016-03-01 DIAGNOSIS — I10 Essential (primary) hypertension: Secondary | ICD-10-CM

## 2016-03-01 DIAGNOSIS — E785 Hyperlipidemia, unspecified: Secondary | ICD-10-CM | POA: Diagnosis not present

## 2016-03-01 DIAGNOSIS — Z0000 Encounter for general adult medical examination without abnormal findings: Secondary | ICD-10-CM

## 2016-03-01 LAB — LIPID PANEL
CHOL/HDL RATIO: 5
Cholesterol: 233 mg/dL — ABNORMAL HIGH (ref 0–200)
HDL: 50.4 mg/dL (ref >39.00)
LDL Cholesterol: 151 mg/dL — ABNORMAL HIGH (ref 0–99)
NONHDL: 182.94
Triglycerides: 162 mg/dL — ABNORMAL HIGH (ref 0.0–149.0)
VLDL: 32.4 mg/dL (ref 0.0–40.0)

## 2016-03-01 LAB — CBC WITH DIFFERENTIAL/PLATELET
BASOS PCT: 0.3 % (ref 0.0–3.0)
Basophils Absolute: 0 10*3/uL (ref 0.0–0.1)
EOS PCT: 1.2 % (ref 0.0–5.0)
Eosinophils Absolute: 0.1 10*3/uL (ref 0.0–0.7)
HEMATOCRIT: 36.2 % — AB (ref 39.0–52.0)
HEMOGLOBIN: 12.2 g/dL — AB (ref 13.0–17.0)
Lymphocytes Relative: 29.7 % (ref 12.0–46.0)
Lymphs Abs: 1.5 10*3/uL (ref 0.7–4.0)
MCHC: 33.6 g/dL (ref 30.0–36.0)
MCV: 88.6 fl (ref 78.0–100.0)
MONO ABS: 0.4 10*3/uL (ref 0.1–1.0)
MONOS PCT: 7 % (ref 3.0–12.0)
Neutro Abs: 3.1 10*3/uL (ref 1.4–7.7)
Neutrophils Relative %: 61.8 % (ref 43.0–77.0)
Platelets: 269 10*3/uL (ref 150.0–400.0)
RBC: 4.09 Mil/uL — AB (ref 4.22–5.81)
RDW: 14.2 % (ref 11.5–15.5)
WBC: 5.1 10*3/uL (ref 4.0–10.5)

## 2016-03-01 LAB — BASIC METABOLIC PANEL
BUN: 25 mg/dL — AB (ref 6–23)
CALCIUM: 9.4 mg/dL (ref 8.4–10.5)
CO2: 27 meq/L (ref 19–32)
CREATININE: 0.98 mg/dL (ref 0.40–1.50)
Chloride: 104 mEq/L (ref 96–112)
GFR: 93.66 mL/min (ref >60.00)
GLUCOSE: 101 mg/dL — AB (ref 70–99)
Potassium: 4.3 mEq/L (ref 3.5–5.1)
SODIUM: 138 meq/L (ref 135–145)

## 2016-03-01 LAB — TSH: TSH: 1.56 u[IU]/mL (ref 0.35–4.50)

## 2016-03-01 NOTE — Progress Notes (Signed)
Pre visit review using our clinic review tool, if applicable. No additional management support is needed unless otherwise documented below in the visit note. 

## 2016-03-01 NOTE — Patient Instructions (Signed)
Hypertension Hypertension, commonly called high blood pressure, is when the force of blood pumping through your arteries is too strong. Your arteries are the blood vessels that carry blood from your heart throughout your body. A blood pressure reading consists of a higher number over a lower number, such as 110/72. The higher number (systolic) is the pressure inside your arteries when your heart pumps. The lower number (diastolic) is the pressure inside your arteries when your heart relaxes. Ideally you want your blood pressure below 120/80. Hypertension forces your heart to work harder to pump blood. Your arteries may become narrow or stiff. Having untreated or uncontrolled hypertension can cause heart attack, stroke, kidney disease, and other problems. RISK FACTORS Some risk factors for high blood pressure are controllable. Others are not.  Risk factors you cannot control include:   Race. You may be at higher risk if you are African American.  Age. Risk increases with age.  Gender. Men are at higher risk than women before age 45 years. After age 65, women are at higher risk than men. Risk factors you can control include:  Not getting enough exercise or physical activity.  Being overweight.  Getting too much fat, sugar, calories, or salt in your diet.  Drinking too much alcohol. SIGNS AND SYMPTOMS Hypertension does not usually cause signs or symptoms. Extremely high blood pressure (hypertensive crisis) may cause headache, anxiety, shortness of breath, and nosebleed. DIAGNOSIS To check if you have hypertension, your health care provider will measure your blood pressure while you are seated, with your arm held at the level of your heart. It should be measured at least twice using the same arm. Certain conditions can cause a difference in blood pressure between your right and left arms. A blood pressure reading that is higher than normal on one occasion does not mean that you need treatment. If  it is not clear whether you have high blood pressure, you may be asked to return on a different day to have your blood pressure checked again. Or, you may be asked to monitor your blood pressure at home for 1 or more weeks. TREATMENT Treating high blood pressure includes making lifestyle changes and possibly taking medicine. Living a healthy lifestyle can help lower high blood pressure. You may need to change some of your habits. Lifestyle changes may include:  Following the DASH diet. This diet is high in fruits, vegetables, and whole grains. It is low in salt, red meat, and added sugars.  Keep your sodium intake below 2,300 mg per day.  Getting at least 30-45 minutes of aerobic exercise at least 4 times per week.  Losing weight if necessary.  Not smoking.  Limiting alcoholic beverages.  Learning ways to reduce stress. Your health care provider may prescribe medicine if lifestyle changes are not enough to get your blood pressure under control, and if one of the following is true:  You are 18-59 years of age and your systolic blood pressure is above 140.  You are 60 years of age or older, and your systolic blood pressure is above 150.  Your diastolic blood pressure is above 90.  You have diabetes, and your systolic blood pressure is over 140 or your diastolic blood pressure is over 90.  You have kidney disease and your blood pressure is above 140/90.  You have heart disease and your blood pressure is above 140/90. Your personal target blood pressure may vary depending on your medical conditions, your age, and other factors. HOME CARE INSTRUCTIONS    Have your blood pressure rechecked as directed by your health care provider.   Take medicines only as directed by your health care provider. Follow the directions carefully. Blood pressure medicines must be taken as prescribed. The medicine does not work as well when you skip doses. Skipping doses also puts you at risk for  problems.  Do not smoke.   Monitor your blood pressure at home as directed by your health care provider. SEEK MEDICAL CARE IF:   You think you are having a reaction to medicines taken.  You have recurrent headaches or feel dizzy.  You have swelling in your ankles.  You have trouble with your vision. SEEK IMMEDIATE MEDICAL CARE IF:  You develop a severe headache or confusion.  You have unusual weakness, numbness, or feel faint.  You have severe chest or abdominal pain.  You vomit repeatedly.  You have trouble breathing. MAKE SURE YOU:   Understand these instructions.  Will watch your condition.  Will get help right away if you are not doing well or get worse.   This information is not intended to replace advice given to you by your health care provider. Make sure you discuss any questions you have with your health care provider.   Document Released: 06/20/2005 Document Revised: 11/04/2014 Document Reviewed: 04/12/2013 Elsevier Interactive Patient Education 2016 [employer]  
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
## 2016-03-03 ENCOUNTER — Ambulatory Visit: Payer: Medicare Other

## 2016-03-06 NOTE — Assessment & Plan Note (Signed)

## 2016-03-14 ENCOUNTER — Other Ambulatory Visit: Payer: Self-pay | Admitting: Internal Medicine

## 2016-03-17 DIAGNOSIS — D075 Carcinoma in situ of prostate: Secondary | ICD-10-CM | POA: Diagnosis not present

## 2016-03-17 DIAGNOSIS — C61 Malignant neoplasm of prostate: Secondary | ICD-10-CM | POA: Diagnosis not present

## 2016-03-17 DIAGNOSIS — Z5111 Encounter for antineoplastic chemotherapy: Secondary | ICD-10-CM | POA: Diagnosis not present

## 2016-04-19 DIAGNOSIS — Z5111 Encounter for antineoplastic chemotherapy: Secondary | ICD-10-CM | POA: Diagnosis not present

## 2016-04-19 DIAGNOSIS — C61 Malignant neoplasm of prostate: Secondary | ICD-10-CM | POA: Diagnosis not present

## 2016-04-19 LAB — PSA: PSA: 0.65

## 2016-04-27 DIAGNOSIS — C61 Malignant neoplasm of prostate: Secondary | ICD-10-CM | POA: Diagnosis not present

## 2016-04-27 DIAGNOSIS — N3011 Interstitial cystitis (chronic) with hematuria: Secondary | ICD-10-CM | POA: Diagnosis not present

## 2016-04-27 DIAGNOSIS — R3982 Chronic bladder pain: Secondary | ICD-10-CM | POA: Diagnosis not present

## 2016-05-12 ENCOUNTER — Encounter: Payer: Self-pay | Admitting: Internal Medicine

## 2016-05-12 LAB — URINALYSIS
BILIRUBIN UA: NEGATIVE
GLUCOSE, UA: NEGATIVE
KETONES UA: NEGATIVE
NITRITES UR: NEGATIVE
PH UA: 6 (ref 4.5–8.0)
Specific Gravity, Urine: 1.015
Urobilinogen, UA: NORMAL
WBC, UA: NONE SEEN

## 2016-05-17 DIAGNOSIS — Z5111 Encounter for antineoplastic chemotherapy: Secondary | ICD-10-CM | POA: Diagnosis not present

## 2016-05-17 DIAGNOSIS — C61 Malignant neoplasm of prostate: Secondary | ICD-10-CM | POA: Diagnosis not present

## 2016-05-17 DIAGNOSIS — D075 Carcinoma in situ of prostate: Secondary | ICD-10-CM | POA: Diagnosis not present

## 2016-05-19 DIAGNOSIS — M25561 Pain in right knee: Secondary | ICD-10-CM | POA: Diagnosis not present

## 2016-05-19 DIAGNOSIS — M170 Bilateral primary osteoarthritis of knee: Secondary | ICD-10-CM | POA: Diagnosis not present

## 2016-05-19 DIAGNOSIS — M25562 Pain in left knee: Secondary | ICD-10-CM | POA: Diagnosis not present

## 2016-05-24 DIAGNOSIS — M25561 Pain in right knee: Secondary | ICD-10-CM | POA: Diagnosis not present

## 2016-05-24 DIAGNOSIS — M1711 Unilateral primary osteoarthritis, right knee: Secondary | ICD-10-CM | POA: Diagnosis not present

## 2016-05-25 DIAGNOSIS — M25562 Pain in left knee: Secondary | ICD-10-CM | POA: Diagnosis not present

## 2016-05-25 DIAGNOSIS — M1712 Unilateral primary osteoarthritis, left knee: Secondary | ICD-10-CM | POA: Diagnosis not present

## 2016-05-31 DIAGNOSIS — M1711 Unilateral primary osteoarthritis, right knee: Secondary | ICD-10-CM | POA: Diagnosis not present

## 2016-05-31 DIAGNOSIS — M25561 Pain in right knee: Secondary | ICD-10-CM | POA: Diagnosis not present

## 2016-06-01 DIAGNOSIS — M25562 Pain in left knee: Secondary | ICD-10-CM | POA: Diagnosis not present

## 2016-06-01 DIAGNOSIS — M1712 Unilateral primary osteoarthritis, left knee: Secondary | ICD-10-CM | POA: Diagnosis not present

## 2016-06-02 ENCOUNTER — Other Ambulatory Visit: Payer: Self-pay | Admitting: Internal Medicine

## 2016-06-02 DIAGNOSIS — I10 Essential (primary) hypertension: Secondary | ICD-10-CM

## 2016-06-02 MED ORDER — CARVEDILOL PHOSPHATE ER 20 MG PO CP24: 20.0000 mg | ORAL_CAPSULE | Freq: Every day | ORAL | 1 refills | Status: DC

## 2016-06-07 DIAGNOSIS — M25561 Pain in right knee: Secondary | ICD-10-CM | POA: Diagnosis not present

## 2016-06-07 DIAGNOSIS — M1711 Unilateral primary osteoarthritis, right knee: Secondary | ICD-10-CM | POA: Diagnosis not present

## 2016-06-08 DIAGNOSIS — M1712 Unilateral primary osteoarthritis, left knee: Secondary | ICD-10-CM | POA: Diagnosis not present

## 2016-06-08 DIAGNOSIS — M25562 Pain in left knee: Secondary | ICD-10-CM | POA: Diagnosis not present

## 2016-06-09 ENCOUNTER — Telehealth: Payer: Self-pay | Admitting: *Deleted

## 2016-06-09 ENCOUNTER — Ambulatory Visit: Admission: RE | Admit: 2016-06-09 | Payer: Medicare Other | Source: Ambulatory Visit | Admitting: Radiation Oncology

## 2016-06-09 NOTE — Telephone Encounter (Signed)
Called patient home, spokew with the wife, patient didn't know he had a follow up appt with Dr. Sondra Come, we will reschedule and have someone call tomorrow to reschedule for him,wife apologized 4:17 PM  4:16 PM

## 2016-06-09 NOTE — Telephone Encounter (Signed)
Pt to be rescheduked missed and forgot appt

## 2016-06-13 ENCOUNTER — Ambulatory Visit
Admission: RE | Admit: 2016-06-13 | Discharge: 2016-06-13 | Disposition: A | Discharge status: Home / Self Care | Payer: Medicare Other | Source: Ambulatory Visit | Attending: Radiation Oncology | Admitting: Radiation Oncology

## 2016-06-13 VITALS — BP 139/89 | HR 70 | Temp 98.4°F | Ht 68.0 in | Wt 217.2 lb

## 2016-06-13 DIAGNOSIS — C61 Malignant neoplasm of prostate: Secondary | ICD-10-CM | POA: Insufficient documentation

## 2016-06-13 DIAGNOSIS — R300 Dysuria: Secondary | ICD-10-CM | POA: Diagnosis not present

## 2016-06-13 DIAGNOSIS — Z79818 Long term (current) use of other agents affecting estrogen receptors and estrogen levels: Secondary | ICD-10-CM | POA: Diagnosis not present

## 2016-06-13 DIAGNOSIS — Z923 Personal history of irradiation: Secondary | ICD-10-CM | POA: Diagnosis not present

## 2016-06-13 DIAGNOSIS — Z79899 Other long term (current) drug therapy: Secondary | ICD-10-CM | POA: Diagnosis not present

## 2016-06-13 DIAGNOSIS — Z91013 Allergy to seafood: Secondary | ICD-10-CM | POA: Insufficient documentation

## 2016-06-13 DIAGNOSIS — Z888 Allergy status to other drugs, medicaments and biological substances status: Secondary | ICD-10-CM | POA: Diagnosis not present

## 2016-06-13 DIAGNOSIS — R319 Hematuria, unspecified: Secondary | ICD-10-CM | POA: Insufficient documentation

## 2016-06-13 DIAGNOSIS — R3915 Urgency of urination: Secondary | ICD-10-CM | POA: Insufficient documentation

## 2016-06-13 DIAGNOSIS — R972 Elevated prostate specific antigen [PSA]: Secondary | ICD-10-CM | POA: Diagnosis not present

## 2016-06-13 NOTE — Progress Notes (Addendum)
Jonathan Medina here for follow up.  He continues to report having pain at a 10/10 due to urinary urgency and dysuria.  He said he has had this issue for 3-4 years and it is getting worse.  He also reports having urinary frequency and is getting up every 45 minutes to 1 hour during the night.  He denies having hematuria.  He had some 2 weeks ago after taking an aspirin.  He will have his next firmagon injection on 12/19 and will see Dr. Jeffie Pollock this month.  He denies having any bowel issues.   BP 139/89 (BP Location: Right Arm, Patient Position: Sitting)   Pulse 70   Temp 98.4 F (36.9 C) (Oral)   Ht '5\' 8"'$  (1.727 m)   Wt 217 lb 3.2 oz (98.5 kg)   SpO2 100%   BMI 33.03 kg/m    Wt Readings from Last 3 Encounters:  06/13/16 217 lb 3.2 oz (98.5 kg)  03/01/16 209 lb 8 oz (95 kg)  12/24/15 210 lb 6.4 oz (95.4 kg)

## 2016-06-13 NOTE — Progress Notes (Signed)
Radiation Oncology         ([phone]) [phone] ________________________________  Name: Jonathan Medina MRN: [id number]  Date: 06/13/2016  DOB: 09-Nov-1929  Follow-Up Visit Note  CC: [name], MD  Irine Seal, MD    ICD-9-CM ICD-10-CM   1. Prostate cancer ([place]) 185 C61     Diagnosis: Stage T1c Adenocarcinoma of the Prostate, Gleason Score 8 (4+4), PSA (37.8), and prostate volume of 73 mL  Interval Since Last Radiation: 7 months  09/10/2015-11/09/2015:  1. The prostate was treated to 30 Gy in 15 fractions at 2 Gy per fraction. (75 Gy cumulative) 2. The pelvis/prostate was treated to 45 Gy in 25 fractions at 1.8 Gy per fraction.  Narrative:  The patient returns today for routine follow-up. He reports 10/10 pain due to urinary urgency and dysuria. He reports this has been an increasing issue for 3-4 years. He reports nocturia every hour. He reports hematuria 2 weeks ago after taking asprin, but denies any current hematuria. He denies bowel issues. He will have his next [place] injection on 06/21/16.  He reports extreme fatigue for 2 weeks following the injection.  ALLERGIES:  is allergic to epinephrine; shellfish allergy; betadine [povidone iodine]; duloxetine; gabapentin; ivp dye [iodinated diagnostic agents]; nisoldipine; simvastatin; statins; and tramadol.  Meds: Current Outpatient Prescriptions  Medication Sig Dispense Refill  . amLODipine (NORVASC) 10 MG tablet TAKE 1 TABLET DAILY IN THE MORNING 90 tablet 3  . carvedilol (COREG CR) 20 MG 24 hr capsule Take 1 capsule (20 mg total) by mouth daily. TAKE 1 CAPSULE EVERY       MORNING 90 capsule 1  . fexofenadine (ALLEGRA) 180 MG tablet Take 180 mg by mouth daily as needed for allergies or rhinitis.    Marland Kitchen losartan (COZAAR) 100 MG tablet TAKE 1 TABLET EVERY MORNING (Patient taking differently: TAKE '100mg'$  by mouth once EVERY MORNING) 90 tablet 1  . Tamsulosin HCl (FLOMAX) 0.4 MG CAPS Take 0.4 mg by mouth daily after breakfast.     . tapentadol  (NUCYNTA) 50 MG TABS tablet Take 1 tablet (50 mg total) by mouth every 6 (six) hours as needed for moderate pain. 20 tablet 0  . Calcium Carb-Cholecalciferol (CALCIUM 1000 + D PO) Take 1 tablet by mouth daily. Reported on 12/24/2015     No current facility-administered medications for this encounter.     Physical Findings: The patient is in no acute distress. Patient is alert and oriented.  height is '5\' 8"'$  (1.727 m) and weight is 217 lb 3.2 oz (98.5 kg). His oral temperature is 98.4 F (36.9 C). His blood pressure is 139/89 and his pulse is 70. His oxygen saturation is 100%.   The patient is ambulatory with a cane. Lungs are clear to auscultation bilaterally. Heart has regular rate and rhythm. No palpable cervical, supraclavicular, or axillary adenopathy.  Lab Findings: Lab Results  Component Value Date   WBC 5.1 03/01/2016   HGB 12.2 (L) 03/01/2016   HCT 36.2 (L) 03/01/2016   MCV 88.6 03/01/2016   PLT 269.0 03/01/2016    Radiographic Findings: No results found.  Impression:  The patient is recovering from the effects of radiation. His PSA has dropped down to 0.65 on 04/19/16, which show a nice response to his radiation and hormone therapy.   Plan: The patient will follow up with radiation oncology prn. Patient will remain under close follow up with Dr. Jeffie Pollock.  ____________________________________ -----------------------------------  Blair Promise, PhD, MD  This document serves as a record  of services personally performed by Gery Pray, MD. It was created on his behalf by Bethann Humble, a trained medical scribe. The creation of this record is based on the scribe's personal observations and the provider's statements to them. This document has been checked and approved by the attending provider.

## 2016-06-14 DIAGNOSIS — M25561 Pain in right knee: Secondary | ICD-10-CM | POA: Diagnosis not present

## 2016-06-14 DIAGNOSIS — M170 Bilateral primary osteoarthritis of knee: Secondary | ICD-10-CM | POA: Diagnosis not present

## 2016-06-14 DIAGNOSIS — M25562 Pain in left knee: Secondary | ICD-10-CM | POA: Diagnosis not present

## 2016-06-21 DIAGNOSIS — D075 Carcinoma in situ of prostate: Secondary | ICD-10-CM | POA: Diagnosis not present

## 2016-06-21 DIAGNOSIS — Z5111 Encounter for antineoplastic chemotherapy: Secondary | ICD-10-CM | POA: Diagnosis not present

## 2016-06-21 DIAGNOSIS — C61 Malignant neoplasm of prostate: Secondary | ICD-10-CM | POA: Diagnosis not present

## 2016-07-12 ENCOUNTER — Telehealth: Payer: Self-pay

## 2016-07-12 ENCOUNTER — Other Ambulatory Visit: Payer: Self-pay | Admitting: Internal Medicine

## 2016-07-12 DIAGNOSIS — I10 Essential (primary) hypertension: Secondary | ICD-10-CM

## 2016-07-12 MED ORDER — LOSARTAN POTASSIUM 100 MG PO TABS: ORAL_TABLET | ORAL | 0 refills | Status: DC

## 2016-07-12 NOTE — Telephone Encounter (Signed)
[facility] mailorder rq rf for losartan.  LOV 03/01/2016. No appt scheduled.

## 2016-07-18 DIAGNOSIS — C61 Malignant neoplasm of prostate: Secondary | ICD-10-CM | POA: Diagnosis not present

## 2016-07-25 DIAGNOSIS — Z5111 Encounter for antineoplastic chemotherapy: Secondary | ICD-10-CM | POA: Diagnosis not present

## 2016-07-25 DIAGNOSIS — N3011 Interstitial cystitis (chronic) with hematuria: Secondary | ICD-10-CM | POA: Diagnosis not present

## 2016-07-25 DIAGNOSIS — R351 Nocturia: Secondary | ICD-10-CM | POA: Diagnosis not present

## 2016-07-25 DIAGNOSIS — N401 Enlarged prostate with lower urinary tract symptoms: Secondary | ICD-10-CM | POA: Diagnosis not present

## 2016-07-25 DIAGNOSIS — C61 Malignant neoplasm of prostate: Secondary | ICD-10-CM | POA: Diagnosis not present

## 2016-08-10 ENCOUNTER — Ambulatory Visit: Payer: Medicare Other | Admitting: Adult Health

## 2016-08-12 ENCOUNTER — Ambulatory Visit (INDEPENDENT_AMBULATORY_CARE_PROVIDER_SITE_OTHER)
Admission: RE | Admit: 2016-08-12 | Discharge: 2016-08-12 | Disposition: A | Discharge status: Home / Self Care | Payer: Medicare Other | Source: Ambulatory Visit | Attending: Adult Health | Admitting: Adult Health

## 2016-08-12 ENCOUNTER — Encounter: Payer: Self-pay | Admitting: Adult Health

## 2016-08-12 ENCOUNTER — Ambulatory Visit (INDEPENDENT_AMBULATORY_CARE_PROVIDER_SITE_OTHER): Payer: Medicare Other | Admitting: Adult Health

## 2016-08-12 VITALS — BP 132/66 | HR 60 | Ht 68.0 in | Wt 222.0 lb

## 2016-08-12 DIAGNOSIS — G4733 Obstructive sleep apnea (adult) (pediatric): Secondary | ICD-10-CM | POA: Diagnosis not present

## 2016-08-12 DIAGNOSIS — C3412 Malignant neoplasm of upper lobe, left bronchus or lung: Secondary | ICD-10-CM | POA: Diagnosis not present

## 2016-08-12 DIAGNOSIS — Z9989 Dependence on other enabling machines and devices: Secondary | ICD-10-CM

## 2016-08-12 DIAGNOSIS — J449 Chronic obstructive pulmonary disease, unspecified: Secondary | ICD-10-CM | POA: Diagnosis not present

## 2016-08-12 NOTE — Assessment & Plan Note (Signed)
Doing well on CPAP , control is not optimal but pt feels rested and is similar to last year.  Cont current setting . Advised to tighten mask straps to help with leaks.   Plan  Patient Instructions  Continue on CPAP At bedtime   Keep up good work .  Do not drive if sleepy.  Work on weight loss.  Chest xray today .  Follow up Dr. Elsworth Soho  In 1 year  And As needed

## 2016-08-12 NOTE — Assessment & Plan Note (Signed)
Previous lung cancer in 2009 s/p resection  Check cxr today .

## 2016-08-12 NOTE — Patient Instructions (Signed)
Continue on CPAP At bedtime   Keep up good work .  Do not drive if sleepy.  Work on weight loss.  Chest xray today .  Follow up Dr. Elsworth Soho  In 1 year  And As needed
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
## 2016-08-13 NOTE — Progress Notes (Signed)
Reviewed & agree with plan  

## 2016-08-23 DIAGNOSIS — Z5111 Encounter for antineoplastic chemotherapy: Secondary | ICD-10-CM | POA: Diagnosis not present

## 2016-08-23 DIAGNOSIS — C61 Malignant neoplasm of prostate: Secondary | ICD-10-CM | POA: Diagnosis not present

## 2016-09-20 DIAGNOSIS — M25561 Pain in right knee: Secondary | ICD-10-CM | POA: Diagnosis not present

## 2016-09-20 DIAGNOSIS — M25562 Pain in left knee: Secondary | ICD-10-CM | POA: Diagnosis not present

## 2016-09-20 DIAGNOSIS — M170 Bilateral primary osteoarthritis of knee: Secondary | ICD-10-CM | POA: Diagnosis not present

## 2016-09-22 DIAGNOSIS — Z5111 Encounter for antineoplastic chemotherapy: Secondary | ICD-10-CM | POA: Diagnosis not present

## 2016-09-22 DIAGNOSIS — C61 Malignant neoplasm of prostate: Secondary | ICD-10-CM | POA: Diagnosis not present

## 2016-10-10 ENCOUNTER — Telehealth: Payer: Self-pay | Admitting: Internal Medicine

## 2016-10-10 DIAGNOSIS — I10 Essential (primary) hypertension: Secondary | ICD-10-CM

## 2016-10-10 NOTE — Telephone Encounter (Signed)
losartan (COZAAR) 100 MG tablet   Patient is requesting a refill on this medication. He needs it sent to [facility]. Thank you.

## 2016-10-11 MED ORDER — LOSARTAN POTASSIUM 100 MG PO TABS: ORAL_TABLET | ORAL | 1 refills | Status: DC

## 2016-10-11 NOTE — Telephone Encounter (Signed)
Refill has been sent to [facility]...[name]

## 2016-10-25 DIAGNOSIS — C61 Malignant neoplasm of prostate: Secondary | ICD-10-CM | POA: Diagnosis not present

## 2016-10-25 DIAGNOSIS — Z5111 Encounter for antineoplastic chemotherapy: Secondary | ICD-10-CM | POA: Diagnosis not present

## 2016-11-03 DIAGNOSIS — H2513 Age-related nuclear cataract, bilateral: Secondary | ICD-10-CM | POA: Diagnosis not present

## 2016-11-17 DIAGNOSIS — C61 Malignant neoplasm of prostate: Secondary | ICD-10-CM | POA: Diagnosis not present

## 2016-11-17 LAB — PSA: PSA: 0.29

## 2016-11-24 DIAGNOSIS — C61 Malignant neoplasm of prostate: Secondary | ICD-10-CM | POA: Diagnosis not present

## 2016-11-24 DIAGNOSIS — N3011 Interstitial cystitis (chronic) with hematuria: Secondary | ICD-10-CM | POA: Diagnosis not present

## 2016-12-12 ENCOUNTER — Other Ambulatory Visit: Payer: Self-pay | Admitting: Internal Medicine

## 2016-12-12 DIAGNOSIS — I10 Essential (primary) hypertension: Secondary | ICD-10-CM

## 2016-12-26 DIAGNOSIS — C61 Malignant neoplasm of prostate: Secondary | ICD-10-CM | POA: Diagnosis not present

## 2016-12-28 ENCOUNTER — Telehealth: Payer: Self-pay | Admitting: Internal Medicine

## 2016-12-28 NOTE — Telephone Encounter (Signed)
Patient states he dropped off a Handicap placard forms on June 25 Monday. He states he give it to someone in the hall who informed them they would place it on MDs desk. No note was places in the chart. I have not seen this form and the same goes for the cma. MD is out of office will check with him on July 2. Patient was informed if we do not find this form we have some here.

## 2016-12-29 NOTE — Telephone Encounter (Signed)
Forms found on MDs desk. It has been completed and signed. Patient will pick up the form at the office. Copy sent to scan.

## 2017-01-16 DIAGNOSIS — M25561 Pain in right knee: Secondary | ICD-10-CM | POA: Diagnosis not present

## 2017-01-16 DIAGNOSIS — M25562 Pain in left knee: Secondary | ICD-10-CM | POA: Diagnosis not present

## 2017-01-16 DIAGNOSIS — M170 Bilateral primary osteoarthritis of knee: Secondary | ICD-10-CM | POA: Diagnosis not present

## 2017-01-23 DIAGNOSIS — M25562 Pain in left knee: Secondary | ICD-10-CM | POA: Diagnosis not present

## 2017-01-23 DIAGNOSIS — M1712 Unilateral primary osteoarthritis, left knee: Secondary | ICD-10-CM | POA: Diagnosis not present

## 2017-01-25 DIAGNOSIS — M25561 Pain in right knee: Secondary | ICD-10-CM | POA: Diagnosis not present

## 2017-01-25 DIAGNOSIS — M1711 Unilateral primary osteoarthritis, right knee: Secondary | ICD-10-CM | POA: Diagnosis not present

## 2017-01-30 DIAGNOSIS — M1712 Unilateral primary osteoarthritis, left knee: Secondary | ICD-10-CM | POA: Diagnosis not present

## 2017-01-30 DIAGNOSIS — M25562 Pain in left knee: Secondary | ICD-10-CM | POA: Diagnosis not present

## 2017-02-01 DIAGNOSIS — M1711 Unilateral primary osteoarthritis, right knee: Secondary | ICD-10-CM | POA: Diagnosis not present

## 2017-02-01 DIAGNOSIS — M25561 Pain in right knee: Secondary | ICD-10-CM | POA: Diagnosis not present

## 2017-02-06 DIAGNOSIS — M25562 Pain in left knee: Secondary | ICD-10-CM | POA: Diagnosis not present

## 2017-02-06 DIAGNOSIS — M1712 Unilateral primary osteoarthritis, left knee: Secondary | ICD-10-CM | POA: Diagnosis not present

## 2017-02-08 DIAGNOSIS — M25561 Pain in right knee: Secondary | ICD-10-CM | POA: Diagnosis not present

## 2017-02-08 DIAGNOSIS — M1711 Unilateral primary osteoarthritis, right knee: Secondary | ICD-10-CM | POA: Diagnosis not present

## 2017-02-13 DIAGNOSIS — M170 Bilateral primary osteoarthritis of knee: Secondary | ICD-10-CM | POA: Diagnosis not present

## 2017-02-13 DIAGNOSIS — M25562 Pain in left knee: Secondary | ICD-10-CM | POA: Diagnosis not present

## 2017-02-13 DIAGNOSIS — M25561 Pain in right knee: Secondary | ICD-10-CM | POA: Diagnosis not present

## 2017-03-13 ENCOUNTER — Other Ambulatory Visit: Payer: Self-pay | Admitting: Internal Medicine

## 2017-03-13 DIAGNOSIS — C61 Malignant neoplasm of prostate: Secondary | ICD-10-CM | POA: Diagnosis not present

## 2017-03-20 DIAGNOSIS — E349 Endocrine disorder, unspecified: Secondary | ICD-10-CM | POA: Diagnosis not present

## 2017-03-20 DIAGNOSIS — R351 Nocturia: Secondary | ICD-10-CM | POA: Diagnosis not present

## 2017-03-20 DIAGNOSIS — N3011 Interstitial cystitis (chronic) with hematuria: Secondary | ICD-10-CM | POA: Diagnosis not present

## 2017-03-20 DIAGNOSIS — C61 Malignant neoplasm of prostate: Secondary | ICD-10-CM | POA: Diagnosis not present

## 2017-04-03 ENCOUNTER — Other Ambulatory Visit: Payer: Self-pay | Admitting: Internal Medicine

## 2017-04-03 DIAGNOSIS — I10 Essential (primary) hypertension: Secondary | ICD-10-CM

## 2017-04-24 ENCOUNTER — Other Ambulatory Visit (INDEPENDENT_AMBULATORY_CARE_PROVIDER_SITE_OTHER): Payer: Medicare Other

## 2017-04-24 ENCOUNTER — Ambulatory Visit (INDEPENDENT_AMBULATORY_CARE_PROVIDER_SITE_OTHER): Payer: Medicare Other | Admitting: Internal Medicine

## 2017-04-24 ENCOUNTER — Encounter: Payer: Self-pay | Admitting: Internal Medicine

## 2017-04-24 VITALS — BP 142/76 | HR 84 | Temp 97.6°F | Resp 16 | Ht 68.0 in | Wt 231.0 lb

## 2017-04-24 DIAGNOSIS — R739 Hyperglycemia, unspecified: Secondary | ICD-10-CM | POA: Diagnosis not present

## 2017-04-24 DIAGNOSIS — I10 Essential (primary) hypertension: Secondary | ICD-10-CM | POA: Diagnosis not present

## 2017-04-24 DIAGNOSIS — D510 Vitamin B12 deficiency anemia due to intrinsic factor deficiency: Secondary | ICD-10-CM

## 2017-04-24 DIAGNOSIS — E785 Hyperlipidemia, unspecified: Secondary | ICD-10-CM | POA: Diagnosis not present

## 2017-04-24 DIAGNOSIS — Z23 Encounter for immunization: Secondary | ICD-10-CM

## 2017-04-24 LAB — CBC WITH DIFFERENTIAL/PLATELET
BASOS ABS: 0 10*3/uL (ref 0.0–0.1)
Basophils Relative: 0.8 % (ref 0.0–3.0)
EOS PCT: 2 % (ref 0.0–5.0)
Eosinophils Absolute: 0.1 10*3/uL (ref 0.0–0.7)
HEMATOCRIT: 36 % — AB (ref 39.0–52.0)
Hemoglobin: 11.7 g/dL — ABNORMAL LOW (ref 13.0–17.0)
LYMPHS PCT: 30.2 % (ref 12.0–46.0)
Lymphs Abs: 1.7 10*3/uL (ref 0.7–4.0)
MCHC: 32.6 g/dL (ref 30.0–36.0)
MCV: 91.9 fl (ref 78.0–100.0)
MONOS PCT: 9.6 % (ref 3.0–12.0)
Monocytes Absolute: 0.5 10*3/uL (ref 0.1–1.0)
NEUTROS ABS: 3.2 10*3/uL (ref 1.4–7.7)
Neutrophils Relative %: 57.4 % (ref 43.0–77.0)
PLATELETS: 260 10*3/uL (ref 150.0–400.0)
RBC: 3.92 Mil/uL — AB (ref 4.22–5.81)
RDW: 13.6 % (ref 11.5–15.5)
WBC: 5.5 10*3/uL (ref 4.0–10.5)

## 2017-04-24 LAB — COMPREHENSIVE METABOLIC PANEL
ALT: 10 U/L (ref 0–53)
AST: 11 U/L (ref 0–37)
Albumin: 4.1 g/dL (ref 3.5–5.2)
Alkaline Phosphatase: 78 U/L (ref 39–117)
BILIRUBIN TOTAL: 0.3 mg/dL (ref 0.2–1.2)
BUN: 23 mg/dL (ref 6–23)
CALCIUM: 9.5 mg/dL (ref 8.4–10.5)
CHLORIDE: 104 meq/L (ref 96–112)
CO2: 28 meq/L (ref 19–32)
Creatinine, Ser: 0.96 mg/dL (ref 0.40–1.50)
GFR: 95.65 mL/min (ref >60.00)
Glucose, Bld: 85 mg/dL (ref 70–99)
POTASSIUM: 4.9 meq/L (ref 3.5–5.1)
Sodium: 139 mEq/L (ref 135–145)
Total Protein: 6.9 g/dL (ref 6.0–8.3)

## 2017-04-24 LAB — IBC PANEL
IRON: 55 ug/dL (ref 42–165)
Saturation Ratios: 14.6 % — ABNORMAL LOW (ref 20.0–50.0)
TRANSFERRIN: 270 mg/dL (ref 212.0–360.0)

## 2017-04-24 LAB — TSH: TSH: 1.94 u[IU]/mL (ref 0.35–4.50)

## 2017-04-24 LAB — LIPID PANEL
CHOL/HDL RATIO: 5
CHOLESTEROL: 208 mg/dL — AB (ref 0–200)
HDL: 40.8 mg/dL (ref >39.00)
NonHDL: 167.39
Triglycerides: 289 mg/dL — ABNORMAL HIGH (ref 0.0–149.0)
VLDL: 57.8 mg/dL — AB (ref 0.0–40.0)

## 2017-04-24 LAB — LDL CHOLESTEROL, DIRECT: Direct LDL: 127 mg/dL

## 2017-04-24 LAB — FOLATE: FOLATE: 20.7 ng/mL (ref >5.9)

## 2017-04-24 LAB — VITAMIN B12: Vitamin B-12: 420 pg/mL (ref 211–911)

## 2017-04-24 LAB — FERRITIN: Ferritin: 29.4 ng/mL (ref 22.0–322.0)

## 2017-04-24 LAB — HEMOGLOBIN A1C: HEMOGLOBIN A1C: 6 % (ref 4.6–6.5)

## 2017-04-24 NOTE — Patient Instructions (Signed)
Anemia, Nonspecific Anemia is a condition in which the concentration of red blood cells or hemoglobin in the blood is below normal. Hemoglobin is a substance in red blood cells that carries oxygen to the tissues of the body. Anemia results in not enough oxygen reaching these tissues. What are the causes? Common causes of anemia include:  Excessive bleeding. Bleeding may be internal or external. This includes excessive bleeding from periods (in women) or from the intestine.  Poor nutrition.  Chronic kidney, thyroid, and liver disease.  Bone marrow disorders that decrease red blood cell production.  Cancer and treatments for cancer.  HIV, AIDS, and their treatments.  Spleen problems that increase red blood cell destruction.  Blood disorders.  Excess destruction of red blood cells due to infection, medicines, and autoimmune disorders. What are the signs or symptoms?  Minor weakness.  Dizziness.  Headache.  Palpitations.  Shortness of breath, especially with exercise.  Paleness.  Cold sensitivity.  Indigestion.  Nausea.  Difficulty sleeping.  Difficulty concentrating. Symptoms may occur suddenly or they may develop slowly. How is this diagnosed? Additional blood tests are often needed. These help your health care provider determine the best treatment. Your health care provider will check your stool for blood and look for other causes of blood loss. How is this treated? Treatment varies depending on the cause of the anemia. Treatment can include:  Supplements of iron, vitamin B12, or folic acid.  Hormone medicines.  A blood transfusion. This may be needed if blood loss is severe.  Hospitalization. This may be needed if there is significant continual blood loss.  Dietary changes.  Spleen removal. Follow these instructions at home: Keep all follow-up appointments. It often takes many weeks to correct anemia, and having your health care provider check on your  condition and your response to treatment is very important. Get help right away if:  You develop extreme weakness, shortness of breath, or chest pain.  You become dizzy or have trouble concentrating.  You develop heavy vaginal bleeding.  You develop a rash.  You have bloody or black, tarry stools.  You faint.  You vomit up blood.  You vomit repeatedly.  You have abdominal pain.  You have a fever or persistent symptoms for more than 2-3 days.  You have a fever and your symptoms suddenly get worse.  You are dehydrated. This information is not intended to replace advice given to you by your health care provider. Make sure you discuss any questions you have with your health care provider. Document Released: 07/28/2004 Document Revised: 12/02/2015 Document Reviewed: 12/14/2012 Elsevier Interactive Patient Education  2017 [employer]  

## 2017-04-24 NOTE — Progress Notes (Signed)
Subjective:  Patient ID: Jonathan Medina, male    DOB: 09-01-1929  Age: 83 y.o. MRN: [id number]  CC: Anemia; Hypertension; and Hyperlipidemia   HPI [name] presents for f/up -he complains that he feels cold all the time.  He offers no other complaints today.  Outpatient Medications Prior to Visit  Medication Sig Dispense Refill  . amLODipine (NORVASC) 10 MG tablet TAKE 1 TABLET DAILY IN THE MORNING 90 tablet 3  . carvedilol (COREG CR) 20 MG 24 hr capsule TAKE 1 CAPSULE EVERY       MORNING 90 capsule 1  . losartan (COZAAR) 100 MG tablet Take 1 tablet (100 mg total) by mouth every morning. 90 tablet 1  . Tamsulosin HCl (FLOMAX) 0.4 MG CAPS Take 0.4 mg by mouth daily after breakfast.     . fexofenadine (ALLEGRA) 180 MG tablet Take 180 mg by mouth daily as needed for allergies or rhinitis.     No facility-administered medications prior to visit.     ROS Review of Systems  Constitutional: Negative.  Negative for appetite change, chills, diaphoresis, fatigue and fever.  HENT: Negative.  Negative for sinus pressure, sore throat and trouble swallowing.   Eyes: Negative.  Negative for visual disturbance.  Respiratory: Negative for cough, chest tightness, shortness of breath and wheezing.   Cardiovascular: Negative for chest pain, palpitations and leg swelling.  Gastrointestinal: Negative for abdominal pain, constipation, diarrhea, nausea and vomiting.  Endocrine: Positive for cold intolerance. Negative for heat intolerance.  Genitourinary: Negative.  Negative for difficulty urinating, frequency, hematuria and urgency.  Musculoskeletal: Negative.  Negative for back pain and myalgias.  Skin: Negative.   Allergic/Immunologic: Negative.   Neurological: Negative.  Negative for dizziness, weakness and light-headedness.  Hematological: Negative for adenopathy. Does not bruise/bleed easily.  Psychiatric/Behavioral: Negative.     Objective:  BP (!) 142/76 (BP Location: Left Arm, Patient  Position: Sitting, Cuff Size: Normal)   Pulse 84   Temp 97.6 F (36.4 C) (Oral)   Resp 16   Ht 5\' 8"  (1.727 m)   Wt 231 lb (104.8 kg)   SpO2 100%   BMI 35.12 kg/m   BP Readings from Last 3 Encounters:  04/24/17 (!) 142/76  08/12/16 132/66  06/13/16 139/89    Wt Readings from Last 3 Encounters:  04/24/17 231 lb (104.8 kg)  08/12/16 222 lb (100.7 kg)  06/13/16 217 lb 3.2 oz (98.5 kg)    Physical Exam  Constitutional: He is oriented to person, place, and time. No distress.  HENT:  Mouth/Throat: Oropharynx is clear and moist. No oropharyngeal exudate.  Eyes: Conjunctivae are normal. Right eye exhibits no discharge. Left eye exhibits no discharge. No scleral icterus.  Neck: Normal range of motion. Neck supple. No JVD present. No thyromegaly present.  Cardiovascular: Normal rate, regular rhythm and intact distal pulses.  Exam reveals no gallop and no friction rub.   No murmur heard. Pulmonary/Chest: Effort normal and breath sounds normal. No respiratory distress. He has no wheezes. He has no rales. He exhibits no tenderness.  Abdominal: Soft. Bowel sounds are normal. He exhibits no distension and no mass. There is no tenderness. There is no rebound and no guarding.  Musculoskeletal: Normal range of motion. He exhibits no edema, tenderness or deformity.  Lymphadenopathy:    He has no cervical adenopathy.  Neurological: He is alert and oriented to person, place, and time.  Skin: Skin is warm and dry. No rash noted. He is not diaphoretic. No erythema. No  pallor.  Vitals reviewed.   Lab Results  Component Value Date   WBC 5.5 04/24/2017   HGB 11.7 (L) 04/24/2017   HCT 36.0 (L) 04/24/2017   PLT 260.0 04/24/2017   GLUCOSE 85 04/24/2017   CHOL 208 (H) 04/24/2017   TRIG 289.0 (H) 04/24/2017   HDL 40.80 04/24/2017   LDLDIRECT 127.0 04/24/2017   LDLCALC 151 (H) 03/01/2016   ALT 10 04/24/2017   AST 11 04/24/2017   NA 139 04/24/2017   K 4.9 04/24/2017   CL 104 04/24/2017    CREATININE 0.96 04/24/2017   BUN 23 04/24/2017   CO2 28 04/24/2017   TSH 1.94 04/24/2017   PSA 0.29 11/17/2016   INR 0.9 08/07/2007   HGBA1C 6.0 04/24/2017    Dg Chest 2 View  Result Date: 08/12/2016 CLINICAL DATA:  History of right lung cancer, COPD, wedge resection. Ex-smoker. EXAM: CHEST  2 VIEW COMPARISON:  Chest x-ray dated 08/14/2014. FINDINGS: Heart size and mediastinal contours stable. Atherosclerotic changes again noted at the aortic arch. There is mild prominence of the pulmonary artery suggesting some degree of pulmonary artery hypertension. Mild scarring/fibrosis at each lung base. Lungs otherwise clear. No pleural effusion or pneumothorax seen. Osseous structures about the chest are unremarkable. There is chronic elevation/eventration of the right hemidiaphragm. IMPRESSION: Stable chest x-ray.  No active cardiopulmonary disease. Aortic atherosclerosis. Probable chronic pulmonary artery hypertension. Chronic bibasilar scarring/fibrosis. Electronically Signed   By: Franki Cabot M.D.   On: 08/12/2016 16:01    Assessment & Plan:   Melburn was seen today for anemia, hypertension and hyperlipidemia.  Diagnoses and all orders for this visit:  Essential hypertension-his blood pressure is well controlled.  Electrolytes and renal function are normal. -     Comprehensive metabolic panel; Future  Pernicious anemia-his H&H are slightly lower than before.  I will screen for vitamin deficiencies to see if this explains his symptoms. -     CBC with Differential/Platelet; Future -     IBC panel; Future -     Folate; Future -     Ferritin; Future -     Vitamin B1; Future -     Vitamin B12; Future  Hyperglycemia -his A1c is at 6.0%.  He is prediabetic.  Medical therapy is not indicated.  He agrees to work on his lifestyle modifications. -     Hemoglobin A1c; Future  Hyperlipidemia with target LDL less than 130-statin therapy is not indicated at his age. -     Lipid panel; Future -      TSH; Future  Need for influenza vaccination -     Flu vaccine HIGH DOSE PF (Fluzone High dose)   I have discontinued Mr. Muse fexofenadine. I am also having him maintain his tamsulosin, carvedilol, amLODipine, and losartan.  No orders of the defined types were placed in this encounter.    Follow-up: Return in about 3 months (around 07/25/2017).  [name], MD

## 2017-04-27 ENCOUNTER — Telehealth: Payer: Self-pay | Admitting: Internal Medicine

## 2017-04-27 NOTE — Telephone Encounter (Signed)
Pt called for his lab work results from 10/22 Please advise and call back

## 2017-04-28 ENCOUNTER — Encounter: Payer: Self-pay | Admitting: Internal Medicine

## 2017-04-28 LAB — VITAMIN B1: VITAMIN B1 (THIAMINE): 19 nmol/L (ref 8–30)

## 2017-04-28 NOTE — Telephone Encounter (Signed)
Can you release the results to me please?

## 2017-05-04 NOTE — Telephone Encounter (Signed)
Pt given lab results, and expressed understanding, put a copy of him and his wife's lab results in the mail.  He would like to know what he can do for his Anemia.  Please advise and call back

## 2017-05-17 DIAGNOSIS — M170 Bilateral primary osteoarthritis of knee: Secondary | ICD-10-CM | POA: Diagnosis not present

## 2017-05-17 DIAGNOSIS — M25562 Pain in left knee: Secondary | ICD-10-CM | POA: Diagnosis not present

## 2017-05-17 DIAGNOSIS — M25561 Pain in right knee: Secondary | ICD-10-CM | POA: Diagnosis not present

## 2017-05-22 DIAGNOSIS — M1712 Unilateral primary osteoarthritis, left knee: Secondary | ICD-10-CM | POA: Diagnosis not present

## 2017-05-22 DIAGNOSIS — M25562 Pain in left knee: Secondary | ICD-10-CM | POA: Diagnosis not present

## 2017-05-23 DIAGNOSIS — H02831 Dermatochalasis of right upper eyelid: Secondary | ICD-10-CM | POA: Diagnosis not present

## 2017-05-23 DIAGNOSIS — H2513 Age-related nuclear cataract, bilateral: Secondary | ICD-10-CM | POA: Diagnosis not present

## 2017-05-23 DIAGNOSIS — H02834 Dermatochalasis of left upper eyelid: Secondary | ICD-10-CM | POA: Diagnosis not present

## 2017-05-30 DIAGNOSIS — M25561 Pain in right knee: Secondary | ICD-10-CM | POA: Diagnosis not present

## 2017-05-30 DIAGNOSIS — M1711 Unilateral primary osteoarthritis, right knee: Secondary | ICD-10-CM | POA: Diagnosis not present

## 2017-05-31 ENCOUNTER — Other Ambulatory Visit: Payer: Self-pay | Admitting: Internal Medicine

## 2017-05-31 DIAGNOSIS — I10 Essential (primary) hypertension: Secondary | ICD-10-CM

## 2017-06-06 DIAGNOSIS — M1711 Unilateral primary osteoarthritis, right knee: Secondary | ICD-10-CM | POA: Diagnosis not present

## 2017-06-06 DIAGNOSIS — M170 Bilateral primary osteoarthritis of knee: Secondary | ICD-10-CM | POA: Diagnosis not present

## 2017-06-06 DIAGNOSIS — M1712 Unilateral primary osteoarthritis, left knee: Secondary | ICD-10-CM | POA: Diagnosis not present

## 2017-06-20 ENCOUNTER — Ambulatory Visit (INDEPENDENT_AMBULATORY_CARE_PROVIDER_SITE_OTHER): Payer: Medicare Other | Admitting: Internal Medicine

## 2017-06-20 ENCOUNTER — Encounter: Payer: Self-pay | Admitting: Internal Medicine

## 2017-06-20 ENCOUNTER — Other Ambulatory Visit (INDEPENDENT_AMBULATORY_CARE_PROVIDER_SITE_OTHER): Payer: Medicare Other

## 2017-06-20 VITALS — BP 132/76 | HR 68 | Temp 98.4°F | Resp 16 | Ht 68.0 in | Wt 228.0 lb

## 2017-06-20 DIAGNOSIS — I10 Essential (primary) hypertension: Secondary | ICD-10-CM

## 2017-06-20 DIAGNOSIS — D638 Anemia in other chronic diseases classified elsewhere: Secondary | ICD-10-CM

## 2017-06-20 LAB — CBC WITH DIFFERENTIAL/PLATELET
BASOS ABS: 0 10*3/uL (ref 0.0–0.1)
Basophils Relative: 0.9 % (ref 0.0–3.0)
EOS ABS: 0.1 10*3/uL (ref 0.0–0.7)
Eosinophils Relative: 2.3 % (ref 0.0–5.0)
HEMATOCRIT: 37.5 % — AB (ref 39.0–52.0)
Hemoglobin: 12.3 g/dL — ABNORMAL LOW (ref 13.0–17.0)
LYMPHS PCT: 33.1 % (ref 12.0–46.0)
Lymphs Abs: 1.7 10*3/uL (ref 0.7–4.0)
MCHC: 32.8 g/dL (ref 30.0–36.0)
MCV: 91 fl (ref 78.0–100.0)
MONOS PCT: 9.1 % (ref 3.0–12.0)
Monocytes Absolute: 0.5 10*3/uL (ref 0.1–1.0)
NEUTROS ABS: 2.9 10*3/uL (ref 1.4–7.7)
Neutrophils Relative %: 54.6 % (ref 43.0–77.0)
PLATELETS: 261 10*3/uL (ref 150.0–400.0)
RBC: 4.12 Mil/uL — AB (ref 4.22–5.81)
RDW: 13.5 % (ref 11.5–15.5)
WBC: 5.3 10*3/uL (ref 4.0–10.5)

## 2017-06-20 NOTE — Progress Notes (Signed)
Subjective:  Patient ID: Jonathan Medina, male    DOB: 1929/08/20  Age: 83 y.o. MRN: [id number]  CC: Hypertension and Anemia   HPI Jonathan Medina presents for f/up - He complains of swelling over his left ankle.  He says he rubs it all day and puts ice on it and the area feels better.  He otherwise feels well and offers no complaints.  He tells me his pressure is well controlled.  He wants to recheck his blood counts today.  Outpatient Medications Prior to Visit  Medication Sig Dispense Refill  . amLODipine (NORVASC) 10 MG tablet TAKE 1 TABLET DAILY IN THE MORNING 90 tablet 3  . carvedilol (COREG CR) 20 MG 24 hr capsule TAKE 1 CAPSULE EVERY       MORNING 90 capsule 1  . losartan (COZAAR) 100 MG tablet Take 1 tablet (100 mg total) by mouth every morning. 90 tablet 1  . Tamsulosin HCl (FLOMAX) 0.4 MG CAPS Take 0.4 mg by mouth daily after breakfast.     . carvedilol (COREG CR) 20 MG 24 hr capsule TAKE 1 CAPSULE EVERY       MORNING 90 capsule 1   No facility-administered medications prior to visit.     ROS Review of Systems  Constitutional: Negative for appetite change, diaphoresis and fatigue.  HENT: Negative.   Eyes: Negative.  Negative for visual disturbance.  Respiratory: Negative for cough, chest tightness and shortness of breath.   Cardiovascular: Negative for chest pain, palpitations and leg swelling.  Gastrointestinal: Negative for abdominal pain, constipation, diarrhea, nausea and vomiting.  Genitourinary: Negative.  Negative for difficulty urinating.  Musculoskeletal: Negative.  Negative for back pain and myalgias.  Skin: Negative.  Negative for color change.  Allergic/Immunologic: Negative.   Neurological: Negative.  Negative for dizziness, weakness and light-headedness.  Hematological: Negative for adenopathy. Does not bruise/bleed easily.  Psychiatric/Behavioral: Negative.     Objective:  BP 132/76 (BP Location: Left Arm, Patient Position: Sitting, Cuff Size: Large)    Pulse 68   Temp 98.4 F (36.9 C) (Oral)   Resp 16   Ht 5\' 8"  (1.727 m)   Wt 228 lb (103.4 kg)   SpO2 98%   BMI 34.67 kg/m   BP Readings from Last 3 Encounters:  06/20/17 132/76  04/24/17 (!) 142/76  08/12/16 132/66    Wt Readings from Last 3 Encounters:  06/20/17 228 lb (103.4 kg)  04/24/17 231 lb (104.8 kg)  08/12/16 222 lb (100.7 kg)    Physical Exam  Constitutional: He is oriented to person, place, and time. No distress.  HENT:  Mouth/Throat: No oropharyngeal exudate.  Eyes: Conjunctivae are normal.  Neck: Normal range of motion. Neck supple. No JVD present. No thyromegaly present.  Cardiovascular: Normal rate, regular rhythm and normal heart sounds.  No murmur heard. Pulmonary/Chest: Effort normal and breath sounds normal. He has no wheezes. He has no rales.  Abdominal: Soft. Bowel sounds are normal. He exhibits no mass.  Musculoskeletal: Normal range of motion. He exhibits no edema, tenderness or deformity.  The area that concerns him is a small fat pad just distal to the lateral malleolus.  It is more prominent than the fat pad on the right side.  There is no peripheral edema.  Lymphadenopathy:    He has no cervical adenopathy.  Neurological: He is alert and oriented to person, place, and time.  Skin: Skin is warm and dry. No rash noted. He is not diaphoretic. No erythema. No pallor.  Vitals reviewed.   Lab Results  Component Value Date   WBC 5.3 06/20/2017   HGB 12.3 (L) 06/20/2017   HCT 37.5 (L) 06/20/2017   PLT 261.0 06/20/2017   GLUCOSE 85 04/24/2017   CHOL 208 (H) 04/24/2017   TRIG 289.0 (H) 04/24/2017   HDL 40.80 04/24/2017   LDLDIRECT 127.0 04/24/2017   LDLCALC 151 (H) 03/01/2016   ALT 10 04/24/2017   AST 11 04/24/2017   NA 139 04/24/2017   K 4.9 04/24/2017   CL 104 04/24/2017   CREATININE 0.96 04/24/2017   BUN 23 04/24/2017   CO2 28 04/24/2017   TSH 1.94 04/24/2017   PSA 0.29 11/17/2016   INR 0.9 08/07/2007   HGBA1C 6.0 04/24/2017     Dg Chest 2 View  Result Date: 08/12/2016 CLINICAL DATA:  History of right lung cancer, COPD, wedge resection. Ex-smoker. EXAM: CHEST  2 VIEW COMPARISON:  Chest x-ray dated 08/14/2014. FINDINGS: Heart size and mediastinal contours stable. Atherosclerotic changes again noted at the aortic arch. There is mild prominence of the pulmonary artery suggesting some degree of pulmonary artery hypertension. Mild scarring/fibrosis at each lung base. Lungs otherwise clear. No pleural effusion or pneumothorax seen. Osseous structures about the chest are unremarkable. There is chronic elevation/eventration of the right hemidiaphragm. IMPRESSION: Stable chest x-ray.  No active cardiopulmonary disease. Aortic atherosclerosis. Probable chronic pulmonary artery hypertension. Chronic bibasilar scarring/fibrosis. Electronically Signed   By: Franki Cabot M.D.   On: 08/12/2016 16:01    Assessment & Plan:   Jonathan Medina was seen today for hypertension and anemia.  Diagnoses and all orders for this visit:  Anemia, chronic disease- His H&H are stable.  This does not require treatment or intervention at this time.  I will continue to monitor this in the future. -     CBC with Differential/Platelet; Future  Essential hypertension- His blood pressure is adequately well controlled.   I am having Jonathan Medina maintain his tamsulosin, amLODipine, losartan, and carvedilol.  No orders of the defined types were placed in this encounter.    Follow-up: Return in about 6 months (around 12/19/2017).  [name], MD

## 2017-06-20 NOTE — Patient Instructions (Signed)
Anemia, Nonspecific Anemia is a condition in which the concentration of red blood cells or hemoglobin in the blood is below normal. Hemoglobin is a substance in red blood cells that carries oxygen to the tissues of the body. Anemia results in not enough oxygen reaching these tissues. What are the causes? Common causes of anemia include:  Excessive bleeding. Bleeding may be internal or external. This includes excessive bleeding from periods (in women) or from the intestine.  Poor nutrition.  Chronic kidney, thyroid, and liver disease.  Bone marrow disorders that decrease red blood cell production.  Cancer and treatments for cancer.  HIV, AIDS, and their treatments.  Spleen problems that increase red blood cell destruction.  Blood disorders.  Excess destruction of red blood cells due to infection, medicines, and autoimmune disorders. What are the signs or symptoms?  Minor weakness.  Dizziness.  Headache.  Palpitations.  Shortness of breath, especially with exercise.  Paleness.  Cold sensitivity.  Indigestion.  Nausea.  Difficulty sleeping.  Difficulty concentrating. Symptoms may occur suddenly or they may develop slowly. How is this diagnosed? Additional blood tests are often needed. These help your health care provider determine the best treatment. Your health care provider will check your stool for blood and look for other causes of blood loss. How is this treated? Treatment varies depending on the cause of the anemia. Treatment can include:  Supplements of iron, vitamin B12, or folic acid.  Hormone medicines.  A blood transfusion. This may be needed if blood loss is severe.  Hospitalization. This may be needed if there is significant continual blood loss.  Dietary changes.  Spleen removal. Follow these instructions at home: Keep all follow-up appointments. It often takes many weeks to correct anemia, and having your health care provider check on your  condition and your response to treatment is very important. Get help right away if:  You develop extreme weakness, shortness of breath, or chest pain.  You become dizzy or have trouble concentrating.  You develop heavy vaginal bleeding.  You develop a rash.  You have bloody or black, tarry stools.  You faint.  You vomit up blood.  You vomit repeatedly.  You have abdominal pain.  You have a fever or persistent symptoms for more than 2-3 days.  You have a fever and your symptoms suddenly get worse.  You are dehydrated. This information is not intended to replace advice given to you by your health care provider. Make sure you discuss any questions you have with your health care provider. Document Released: 07/28/2004 Document Revised: 12/02/2015 Document Reviewed: 12/14/2012 Elsevier Interactive Patient Education  2017 [employer]  

## 2017-06-28 ENCOUNTER — Telehealth: Payer: Self-pay | Admitting: Internal Medicine

## 2017-06-28 NOTE — Telephone Encounter (Signed)
Spoke with pt.; he is requesting lab results of 06/20/17; stated he would also like them to be mailed to him.  Called the office; spoke with nurse, [place].  Gave verbal okay to give results to the pt., and stated she will mail the CBC results of 12/18, per pt. request.  Gave results to pt. At this time, and advised that the office will mail results.  Pt. Verb. Understanding.

## 2017-06-28 NOTE — Telephone Encounter (Signed)
Copied from [place] [id number]. Topic: Quick Communication - Lab Results >> Jun 28, 2017  2:48 PM Malena Catholic I, NT wrote: [facility] CRM LAB RESULTS

## 2017-07-12 DIAGNOSIS — C61 Malignant neoplasm of prostate: Secondary | ICD-10-CM | POA: Diagnosis not present

## 2017-07-12 LAB — PSA: PSA: 0.81

## 2017-07-19 DIAGNOSIS — N3011 Interstitial cystitis (chronic) with hematuria: Secondary | ICD-10-CM | POA: Diagnosis not present

## 2017-07-19 DIAGNOSIS — R351 Nocturia: Secondary | ICD-10-CM | POA: Diagnosis not present

## 2017-07-19 DIAGNOSIS — C61 Malignant neoplasm of prostate: Secondary | ICD-10-CM | POA: Diagnosis not present

## 2017-07-19 DIAGNOSIS — N401 Enlarged prostate with lower urinary tract symptoms: Secondary | ICD-10-CM | POA: Diagnosis not present

## 2017-07-20 ENCOUNTER — Telehealth: Payer: Self-pay | Admitting: Internal Medicine

## 2017-07-20 DIAGNOSIS — M25579 Pain in unspecified ankle and joints of unspecified foot: Secondary | ICD-10-CM

## 2017-07-20 NOTE — Telephone Encounter (Signed)
Copied from [place] [id number]. Topic: Referral - Request >> Jul 20, 2017  3:26 PM Aurelio Brash B wrote: Reason for CRM:Pt is requesting a referral to see Dr Bo Merino , Rheumatologist  due to his ankle, feet and joint pain.  Her office number is [phone].   Her fax number is [phone].

## 2017-07-24 NOTE — Telephone Encounter (Signed)
Spoke to Dr. Arlean Hopping office. They said to go ahead and put referral in. They did speak with pt's and they are aware of the process.

## 2017-07-26 ENCOUNTER — Other Ambulatory Visit: Payer: Self-pay | Admitting: Internal Medicine

## 2017-07-26 DIAGNOSIS — M160 Bilateral primary osteoarthritis of hip: Secondary | ICD-10-CM

## 2017-07-27 NOTE — Addendum Note (Signed)
Addended by: Aviva Signs M on: 07/27/2017 04:59 PM   Modules accepted: Orders

## 2017-07-27 NOTE — Telephone Encounter (Signed)
Referral entered  
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
## 2017-09-12 ENCOUNTER — Ambulatory Visit (INDEPENDENT_AMBULATORY_CARE_PROVIDER_SITE_OTHER): Payer: Self-pay

## 2017-09-12 ENCOUNTER — Ambulatory Visit (INDEPENDENT_AMBULATORY_CARE_PROVIDER_SITE_OTHER): Payer: Medicare Other | Admitting: Rheumatology

## 2017-09-12 ENCOUNTER — Encounter: Payer: Self-pay | Admitting: Rheumatology

## 2017-09-12 VITALS — BP 117/63 | HR 57 | Resp 18 | Ht 68.0 in | Wt 229.5 lb

## 2017-09-12 DIAGNOSIS — M25572 Pain in left ankle and joints of left foot: Secondary | ICD-10-CM | POA: Diagnosis not present

## 2017-09-12 DIAGNOSIS — M79671 Pain in right foot: Secondary | ICD-10-CM

## 2017-09-12 DIAGNOSIS — Z87891 Personal history of nicotine dependence: Secondary | ICD-10-CM | POA: Insufficient documentation

## 2017-09-12 DIAGNOSIS — Z8639 Personal history of other endocrine, nutritional and metabolic disease: Secondary | ICD-10-CM

## 2017-09-12 DIAGNOSIS — Z8669 Personal history of other diseases of the nervous system and sense organs: Secondary | ICD-10-CM

## 2017-09-12 DIAGNOSIS — M160 Bilateral primary osteoarthritis of hip: Secondary | ICD-10-CM

## 2017-09-12 DIAGNOSIS — M79672 Pain in left foot: Secondary | ICD-10-CM

## 2017-09-12 DIAGNOSIS — C3490 Malignant neoplasm of unspecified part of unspecified bronchus or lung: Secondary | ICD-10-CM

## 2017-09-12 DIAGNOSIS — M25571 Pain in right ankle and joints of right foot: Secondary | ICD-10-CM | POA: Diagnosis not present

## 2017-09-12 DIAGNOSIS — Z8739 Personal history of other diseases of the musculoskeletal system and connective tissue: Secondary | ICD-10-CM

## 2017-09-12 DIAGNOSIS — Z86010 Personal history of colonic polyps: Secondary | ICD-10-CM | POA: Diagnosis not present

## 2017-09-12 DIAGNOSIS — Z8709 Personal history of other diseases of the respiratory system: Secondary | ICD-10-CM

## 2017-09-12 DIAGNOSIS — I10 Essential (primary) hypertension: Secondary | ICD-10-CM | POA: Diagnosis not present

## 2017-09-12 DIAGNOSIS — Z8659 Personal history of other mental and behavioral disorders: Secondary | ICD-10-CM | POA: Diagnosis not present

## 2017-09-12 DIAGNOSIS — C61 Malignant neoplasm of prostate: Secondary | ICD-10-CM

## 2017-09-12 DIAGNOSIS — G8929 Other chronic pain: Secondary | ICD-10-CM | POA: Diagnosis not present

## 2017-09-12 DIAGNOSIS — R600 Localized edema: Secondary | ICD-10-CM

## 2017-09-12 NOTE — Patient Instructions (Signed)

## 2017-09-13 LAB — RHEUMATOID FACTOR

## 2017-09-13 LAB — CYCLIC CITRUL PEPTIDE ANTIBODY, IGG: Cyclic Citrullin Peptide Ab: <16 UNITS

## 2017-09-13 LAB — URIC ACID: URIC ACID, SERUM: 5.8 mg/dL (ref 4.0–8.0)

## 2017-09-13 LAB — SEDIMENTATION RATE: SED RATE: 39 mm/h — AB (ref 0–20)

## 2017-09-13 NOTE — Progress Notes (Signed)
Labs are unremarkable.  He may cancel his follow-up visit.  Elevated ESR is due to his age.

## 2017-09-14 ENCOUNTER — Telehealth: Payer: Self-pay | Admitting: Rheumatology

## 2017-09-14 NOTE — Telephone Encounter (Signed)
Patient called stating that he is returning your call regarding his bloodwork.

## 2017-09-15 ENCOUNTER — Ambulatory Visit: Payer: Medicare Other | Admitting: Pulmonary Disease

## 2017-09-15 NOTE — Telephone Encounter (Signed)
Patient advised of lab results and verbalized understanding.  

## 2017-09-19 ENCOUNTER — Encounter: Payer: Self-pay | Admitting: Pulmonary Disease

## 2017-09-19 ENCOUNTER — Ambulatory Visit (INDEPENDENT_AMBULATORY_CARE_PROVIDER_SITE_OTHER): Payer: Medicare Other | Admitting: Pulmonary Disease

## 2017-09-19 DIAGNOSIS — G4733 Obstructive sleep apnea (adult) (pediatric): Secondary | ICD-10-CM

## 2017-09-19 DIAGNOSIS — Z9989 Dependence on other enabling machines and devices: Secondary | ICD-10-CM | POA: Diagnosis not present

## 2017-09-19 DIAGNOSIS — C61 Malignant neoplasm of prostate: Secondary | ICD-10-CM | POA: Diagnosis not present

## 2017-09-19 NOTE — Progress Notes (Signed)
   Subjective:    Patient ID: Jonathan Medina, male    DOB: 03-27-1930, 84 y.o.   MRN: [id number]  HPI  84 yo [place], ex smoker for FU of obstructive sleep apnea & chronic insomnia/ shift work disorder h/o VATS LUlobectomy 08/2007  for lung cancer  He has PTSD and has been acting out his dreams occasionally for many years. His current machine is about 82 years old and he requests a new machine. He has been struggling with prostate cancer for 2 years.  He has interstitial cystitis and pelvic pain for which he takes Nucynta, is up almost every hour for nocturia.  CPAP download was reviewed and this shows excellent control of events on 10 cm with AHI 5/hour on average with moderate leak.  Wife sleeps in a different room when this leak does not wake her up.  She reports that he is still acting out his dreams about once a month but has not injured himself or fallen out of bed.    Significant tests/ events reviewed  PSG 06/2010 showed poor sleep efficiency with TST of 197 mins, AHI 12/h , desatn < 88% x 26 mins & lowest desaturation to 76%. BMI was 35    Review of Systems Patient denies significant dyspnea,cough, hemoptysis,  chest pain, palpitations, pedal edema, orthopnea, paroxysmal nocturnal dyspnea, lightheadedness, nausea, vomiting, abdominal or  leg pains      Objective:   Physical Exam  Gen. Pleasant, well-nourished, in no distress ENT - no thrush, no post nasal drip Neck: No JVD, no thyromegaly, no carotid bruits Lungs: no use of accessory muscles, no dullness to percussion, clear without rales or rhonchi  Cardiovascular: Rhythm regular, heart sounds  normal, no murmurs or gallops, no peripheral edema Musculoskeletal: No deformities, no cyanosis or clubbing        Assessment & Plan:

## 2017-09-19 NOTE — Patient Instructions (Signed)
Prescription for new CP[facility] Check download in 1 month

## 2017-09-19 NOTE — Assessment & Plan Note (Signed)
Nocturia related to interstitial cystitis  OSA seems to be adequately treated.

## 2017-09-19 NOTE — Assessment & Plan Note (Signed)
Prescription for new CPAP [facility] Check download in 1 month  Weight loss encouraged, compliance with goal of at least 4-6 hrs every night is the expectation. Advised against medications with sedative side effects Cautioned against driving when sleepy - understanding that sleepiness will vary on a day to day basis

## 2017-09-19 NOTE — Addendum Note (Signed)
Addended by: Valerie Salts on: 09/19/2017 02:52 PM   Modules accepted: Orders

## 2017-09-21 ENCOUNTER — Telehealth: Payer: Self-pay | Admitting: Rheumatology

## 2017-09-21 NOTE — Telephone Encounter (Signed)
Patient would like a copy of his feet xrays that were taken here. Patient would like those mailed to him, if possible. Or, he will pick CD up if we can not mail to him.

## 2017-09-25 ENCOUNTER — Other Ambulatory Visit: Payer: Self-pay | Admitting: Internal Medicine

## 2017-09-25 DIAGNOSIS — I10 Essential (primary) hypertension: Secondary | ICD-10-CM

## 2017-09-25 NOTE — Telephone Encounter (Signed)
CD mailed, called patient.

## 2017-10-03 DIAGNOSIS — M19072 Primary osteoarthritis, left ankle and foot: Secondary | ICD-10-CM

## 2017-10-03 DIAGNOSIS — M160 Bilateral primary osteoarthritis of hip: Secondary | ICD-10-CM | POA: Insufficient documentation

## 2017-10-03 DIAGNOSIS — M25562 Pain in left knee: Secondary | ICD-10-CM | POA: Diagnosis not present

## 2017-10-03 DIAGNOSIS — R600 Localized edema: Secondary | ICD-10-CM | POA: Insufficient documentation

## 2017-10-03 DIAGNOSIS — M170 Bilateral primary osteoarthritis of knee: Secondary | ICD-10-CM | POA: Diagnosis not present

## 2017-10-03 DIAGNOSIS — M19071 Primary osteoarthritis, right ankle and foot: Secondary | ICD-10-CM | POA: Insufficient documentation

## 2017-10-03 DIAGNOSIS — M722 Plantar fascial fibromatosis: Secondary | ICD-10-CM | POA: Insufficient documentation

## 2017-10-03 DIAGNOSIS — M25561 Pain in right knee: Secondary | ICD-10-CM | POA: Diagnosis not present

## 2017-10-03 DIAGNOSIS — Z8659 Personal history of other mental and behavioral disorders: Secondary | ICD-10-CM | POA: Insufficient documentation

## 2017-10-03 DIAGNOSIS — Z8669 Personal history of other diseases of the nervous system and sense organs: Secondary | ICD-10-CM | POA: Insufficient documentation
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
## 2017-10-10 ENCOUNTER — Ambulatory Visit: Payer: Medicare Other | Admitting: Rheumatology

## 2017-10-11 DIAGNOSIS — C61 Malignant neoplasm of prostate: Secondary | ICD-10-CM | POA: Diagnosis not present

## 2017-10-11 LAB — PSA: PSA: 0.65

## 2017-10-18 ENCOUNTER — Ambulatory Visit (INDEPENDENT_AMBULATORY_CARE_PROVIDER_SITE_OTHER): Payer: Medicare Other | Admitting: Podiatry

## 2017-10-18 ENCOUNTER — Other Ambulatory Visit: Payer: Self-pay | Admitting: Podiatry

## 2017-10-18 ENCOUNTER — Ambulatory Visit (INDEPENDENT_AMBULATORY_CARE_PROVIDER_SITE_OTHER): Payer: Medicare Other

## 2017-10-18 ENCOUNTER — Encounter: Payer: Self-pay | Admitting: Podiatry

## 2017-10-18 DIAGNOSIS — M722 Plantar fascial fibromatosis: Secondary | ICD-10-CM

## 2017-10-18 DIAGNOSIS — M779 Enthesopathy, unspecified: Secondary | ICD-10-CM | POA: Diagnosis not present

## 2017-10-18 MED ORDER — TRIAMCINOLONE ACETONIDE 10 MG/ML IJ SUSP
10.0000 mg | Freq: Once | INTRAMUSCULAR | Status: AC
Start: 2017-10-18 — End: 2017-10-18
  Administered 2017-10-18: 10 mg

## 2017-10-18 NOTE — Progress Notes (Signed)
Subjective:   Patient ID: Jonathan Medina, male   DOB: 84 y.o.   MRN: [id number]   HPI Patient states he has pain in both his feet but his left one bothers him more than the right and it gets inflamed.  He does not member specific going on for a fairly long period of time and patient does not smoke currently and likes to be active   Review of Systems  All other systems reviewed and are negative.       Objective:  Physical Exam  Constitutional: He appears well-developed and well-nourished.  Cardiovascular: Intact distal pulses.  Pulmonary/Chest: Effort normal.  Musculoskeletal: Normal range of motion.  Neurological: He is alert.  Skin: Skin is warm.  Nursing note and vitals reviewed.   Neurovascular status intact muscle strength is adequate range of motion was within normal noted to have exquisite discomfort in the left sinus tarsi with fluid buildup and discomfort in the arch of a mild to moderate nature.  Patient was found to have good digital perfusion is well oriented x3     Assessment:  Inflammatory sinus tarsitis left with plantar fascial symptomatology left over right     Plan:  H&P x-rays reviewed and injected the left sinus tarsi 3 mg Kenalog 5 mg Xylocaine and dispense fascial brace to lift the arch.  Patient will be seen back for [place] to recheck again in the next several weeks and we will see the response to this treatment  X-ray indicates that there is moderate osteoarthritis with no indications of stress fracture or fracture or osteoporosis

## 2017-10-25 DIAGNOSIS — N3011 Interstitial cystitis (chronic) with hematuria: Secondary | ICD-10-CM | POA: Diagnosis not present

## 2017-10-25 DIAGNOSIS — R310 Gross hematuria: Secondary | ICD-10-CM | POA: Diagnosis not present

## 2017-10-25 DIAGNOSIS — E349 Endocrine disorder, unspecified: Secondary | ICD-10-CM | POA: Diagnosis not present

## 2017-10-25 DIAGNOSIS — C61 Malignant neoplasm of prostate: Secondary | ICD-10-CM | POA: Diagnosis not present

## 2017-11-15 ENCOUNTER — Ambulatory Visit (INDEPENDENT_AMBULATORY_CARE_PROVIDER_SITE_OTHER): Payer: Medicare Other | Admitting: Podiatry

## 2017-11-15 ENCOUNTER — Encounter: Payer: Self-pay | Admitting: Podiatry

## 2017-11-15 DIAGNOSIS — M7751 Other enthesopathy of right foot: Secondary | ICD-10-CM | POA: Diagnosis not present

## 2017-11-15 DIAGNOSIS — M722 Plantar fascial fibromatosis: Secondary | ICD-10-CM | POA: Diagnosis not present

## 2017-11-15 DIAGNOSIS — M779 Enthesopathy, unspecified: Secondary | ICD-10-CM

## 2017-11-19 NOTE — Progress Notes (Signed)
Subjective:   Patient ID: Jonathan Medina, male   DOB: 84 y.o.   MRN: [id number]   HPI Patient states feet are feeling much better and the brace really seems to be helping her     ROS      Objective:  Physical Exam  Neurovascular status intact negative Homans sign noted with patient doing much better and walk with a better heel toe gait pattern currently     Assessment:  Fasciitis symptoms improving quite well     Plan:  H&P condition reviewed and advised continued bracing therapy anti-inflammatories physical therapy and I did dispense a second brace for the opposite foot.  Reappoint as needed

## 2017-11-26 ENCOUNTER — Other Ambulatory Visit: Payer: Self-pay | Admitting: Internal Medicine

## 2017-11-26 DIAGNOSIS — I10 Essential (primary) hypertension: Secondary | ICD-10-CM

## 2017-12-15 ENCOUNTER — Telehealth: Payer: Self-pay | Admitting: Emergency Medicine

## 2017-12-15 NOTE — Telephone Encounter (Signed)
Called patient to schedule AWV. Patient declined at this time. 

## 2018-01-11 ENCOUNTER — Telehealth: Payer: Self-pay

## 2018-01-11 NOTE — Telephone Encounter (Signed)
Copied from [place] [id number]. Topic: Referral - Request >> Jan 11, 2018  2:06 PM Conception Chancy, NT wrote: Reason for CRM: patient states he went to his Neurologist and they advise he go and see his GI doctor. He states he has seen Dr. Earlean Shawl before but is needing a referral placed. Please advies.  >> Jan 11, 2018  2:07 PM Conception Chancy, NT wrote: Fax# to Dr. Earlean Shawl  [phone] >> Jan 11, 2018  4:19 PM Cairrikier Dian Queen, CMA wrote: Is a referral required.  >> Jan 11, 2018  4:37 PM Cora Daniels D wrote: Can you please put in a gastro referral with Dr. Earlean Shawl in the notes? thanks

## 2018-01-11 NOTE — Telephone Encounter (Signed)
Left detailed message for patient. We need to know what the reason for the GI referral, example: routine colonoscopy or is there an issue going on.

## 2018-01-12 ENCOUNTER — Ambulatory Visit: Payer: Self-pay

## 2018-01-12 NOTE — Telephone Encounter (Signed)
Called patient to ask what the reason for the referral would be based on Stefannie's note below.  No answer, left message for patient to call back. Please clarify reason for referral.

## 2018-01-12 NOTE — Telephone Encounter (Signed)
Patient called in with c/o "abdominal pain." He says "I have pain in my bladder all the way down my urinary tract. When I eat, the pain comes and it stays until I pass gas, then the pain is gone. This has been going on since I finished chemo and radiation in April. I called the urologist office and was told to get a referral for a GI and to follow up with my primary." I asked him to rate the pain, he says "10 when it's there." I asked about other symptoms, he denies. According to protocol, see PCP within 24 hours, no availability with PCP for afternoons, as requested by patient. Appointment scheduled for Monday, 01/15/18 at 1540 with Jodi Mourning, FNP, care advice given, patient verbalized understanding.  Reason for Disposition . [1] MODERATE pain (e.g., interferes with normal activities) AND [2] pain comes and goes (cramps) AND [3] present > 24 hours  (Exception: pain with Vomiting or Diarrhea - see that Guideline)  Answer Assessment - Initial Assessment Questions 1. LOCATION: "Where does it hurt?"      Bladder  2. RADIATION: "Does the pain shoot anywhere else?" (e.g., chest, back)     Down my urinary tract 3. ONSET: "When did the pain begin?" (Minutes, hours or days ago)      Since April when chemo stopped 4. SUDDEN: "Gradual or sudden onset?"     Gradual 5. PATTERN "Does the pain come and go, or is it constant?"    - If constant: "Is it getting better, staying the same, or worsening?"      (Note: Constant means the pain never goes away completely; most serious pain is constant and it progresses)     - If intermittent: "How long does it last?" "Do you have pain now?"     (Note: Intermittent means the pain goes away completely between bouts)     Come and go 6. SEVERITY: "How bad is the pain?"  (e.g., Scale 1-10; mild, moderate, or severe)    - MILD (1-3): doesn't interfere with normal activities, abdomen soft and not tender to touch     - MODERATE (4-7): interferes with normal activities or  awakens from sleep, tender to touch     - SEVERE (8-10): excruciating pain, doubled over, unable to do any normal activities       10 7. RECURRENT SYMPTOM: "Have you ever had this type of abdominal pain before?" If so, ask: "When was the last time?" and "What happened that time?"      No 8. CAUSE: "What do you think is causing the abdominal pain?"     I don't know 9. RELIEVING/AGGRAVATING FACTORS: "What makes it better or worse?" (e.g., movement, antacids, bowel movement)     After I pass gas; eating makes the pain come back 10. OTHER SYMPTOMS: "Has there been any vomiting, diarrhea, constipation, or urine problems?"      No  Protocols used: ABDOMINAL PAIN - MALE-A-AH

## 2018-01-12 NOTE — Telephone Encounter (Signed)
Pt calling back about referral

## 2018-01-15 ENCOUNTER — Ambulatory Visit (INDEPENDENT_AMBULATORY_CARE_PROVIDER_SITE_OTHER): Payer: Medicare Other | Admitting: Family

## 2018-01-15 VITALS — BP 124/78 | HR 58 | Temp 97.7°F | Ht 68.0 in | Wt 216.0 lb

## 2018-01-15 DIAGNOSIS — R300 Dysuria: Secondary | ICD-10-CM | POA: Diagnosis not present

## 2018-01-15 DIAGNOSIS — N3010 Interstitial cystitis (chronic) without hematuria: Secondary | ICD-10-CM
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
## 2018-01-16 ENCOUNTER — Encounter: Payer: Self-pay | Admitting: Family

## 2018-01-16 DIAGNOSIS — C61 Malignant neoplasm of prostate: Secondary | ICD-10-CM | POA: Diagnosis not present

## 2018-01-16 DIAGNOSIS — N3011 Interstitial cystitis (chronic) with hematuria: Secondary | ICD-10-CM | POA: Diagnosis not present

## 2018-01-25 DIAGNOSIS — N3011 Interstitial cystitis (chronic) with hematuria: Secondary | ICD-10-CM | POA: Diagnosis not present

## 2018-02-01 DIAGNOSIS — N329 Bladder disorder, unspecified: Secondary | ICD-10-CM | POA: Diagnosis not present

## 2018-02-26 ENCOUNTER — Other Ambulatory Visit: Payer: Self-pay | Admitting: Urology

## 2018-02-26 ENCOUNTER — Encounter (HOSPITAL_BASED_OUTPATIENT_CLINIC_OR_DEPARTMENT_OTHER): Payer: Self-pay

## 2018-02-26 ENCOUNTER — Other Ambulatory Visit: Payer: Self-pay

## 2018-02-26 NOTE — Progress Notes (Signed)
Spoke with:  Rosaria Ferries NPO:  After Midnight, no gum, candy, or mints  Arrival time: 2258TM Labs:  Istat 4, EKG AM medications:  Amlodipine, Carvedilol, Losartan, Tamsulosin Pre op orders: Needs second sign Ride home: Narda Rutherford (wife) [phone] Will bring CPAP machine

## 2018-02-26 NOTE — Anesthesia Preprocedure Evaluation (Addendum)
Anesthesia Evaluation  Patient identified by MRN, date of birth, ID band Patient awake    Reviewed: Allergy & Precautions, NPO status , Patient's Chart, lab work & pertinent test results  History of Anesthesia Complications Negative for: history of anesthetic complications  Airway Mallampati: II  TM Distance: >3 FB Neck ROM: Full    Dental  (+) Dental Advisory Given, Partial Lower   Pulmonary sleep apnea and Continuous Positive Airway Pressure Ventilation , COPD, former smoker,   Lung cancer s/p wedge resection    breath sounds clear to auscultation       Cardiovascular hypertension, Pt. on medications and Pt. on home beta blockers  Rhythm:Regular Rate:Normal     Neuro/Psych Anxiety  PTSD  Bell's palsy Peripheral neuropathy   Neuromuscular disease    GI/Hepatic negative GI ROS, Neg liver ROS,   Endo/Other   Obesity Pre-DM   Renal/GU negative Renal ROS    BPH Prostate cancer     Musculoskeletal  (+) Arthritis ,  Gout    Abdominal   Peds  Hematology negative hematology ROS (+)   Anesthesia Other Findings   Reproductive/Obstetrics                          Anesthesia Physical Anesthesia Plan  ASA: III  Anesthesia Plan: General   Post-op Pain Management:    Induction: Intravenous  PONV Risk Score and Plan: 2 and Treatment may vary due to age or medical condition and Ondansetron  Airway Management Planned: LMA  Additional Equipment: None  Intra-op Plan:   Post-operative Plan: Extubation in OR  Informed Consent: I have reviewed the patients History and Physical, chart, labs and discussed the procedure including the risks, benefits and alternatives for the proposed anesthesia with the patient or authorized representative who has indicated his/her understanding and acceptance.   Dental advisory given  Plan Discussed with: CRNA and Anesthesiologist  Anesthesia Plan  Comments:        Anesthesia Quick Evaluation

## 2018-02-27 ENCOUNTER — Encounter (HOSPITAL_BASED_OUTPATIENT_CLINIC_OR_DEPARTMENT_OTHER): Payer: Self-pay | Admitting: *Deleted

## 2018-02-27 ENCOUNTER — Encounter (HOSPITAL_BASED_OUTPATIENT_CLINIC_OR_DEPARTMENT_OTHER)
Admission: RE | Disposition: A | Discharge status: Home / Self Care | Payer: Self-pay | Source: Ambulatory Visit | Attending: Urology

## 2018-02-27 ENCOUNTER — Other Ambulatory Visit: Payer: Self-pay

## 2018-02-27 ENCOUNTER — Ambulatory Visit (HOSPITAL_BASED_OUTPATIENT_CLINIC_OR_DEPARTMENT_OTHER)
Admission: RE | Admit: 2018-02-27 | Discharge: 2018-02-27 | Disposition: A | Discharge status: Home / Self Care | Payer: Medicare Other | Source: Ambulatory Visit | Attending: Urology | Admitting: Urology

## 2018-02-27 ENCOUNTER — Ambulatory Visit (HOSPITAL_BASED_OUTPATIENT_CLINIC_OR_DEPARTMENT_OTHER): Payer: Medicare Other | Admitting: Anesthesiology

## 2018-02-27 DIAGNOSIS — Z888 Allergy status to other drugs, medicaments and biological substances status: Secondary | ICD-10-CM | POA: Diagnosis not present

## 2018-02-27 DIAGNOSIS — Z923 Personal history of irradiation: Secondary | ICD-10-CM | POA: Insufficient documentation

## 2018-02-27 DIAGNOSIS — N3289 Other specified disorders of bladder: Secondary | ICD-10-CM | POA: Insufficient documentation

## 2018-02-27 DIAGNOSIS — Z91041 Radiographic dye allergy status: Secondary | ICD-10-CM | POA: Insufficient documentation

## 2018-02-27 DIAGNOSIS — N400 Enlarged prostate without lower urinary tract symptoms: Secondary | ICD-10-CM | POA: Diagnosis not present

## 2018-02-27 DIAGNOSIS — M1990 Unspecified osteoarthritis, unspecified site: Secondary | ICD-10-CM | POA: Insufficient documentation

## 2018-02-27 DIAGNOSIS — Z9989 Dependence on other enabling machines and devices: Secondary | ICD-10-CM | POA: Insufficient documentation

## 2018-02-27 DIAGNOSIS — Z79899 Other long term (current) drug therapy: Secondary | ICD-10-CM | POA: Insufficient documentation

## 2018-02-27 DIAGNOSIS — E669 Obesity, unspecified: Secondary | ICD-10-CM | POA: Insufficient documentation

## 2018-02-27 DIAGNOSIS — Z885 Allergy status to narcotic agent status: Secondary | ICD-10-CM | POA: Insufficient documentation

## 2018-02-27 DIAGNOSIS — J449 Chronic obstructive pulmonary disease, unspecified: Secondary | ICD-10-CM | POA: Insufficient documentation

## 2018-02-27 DIAGNOSIS — G4730 Sleep apnea, unspecified: Secondary | ICD-10-CM | POA: Insufficient documentation

## 2018-02-27 DIAGNOSIS — G609 Hereditary and idiopathic neuropathy, unspecified: Secondary | ICD-10-CM | POA: Insufficient documentation

## 2018-02-27 DIAGNOSIS — Z87891 Personal history of nicotine dependence: Secondary | ICD-10-CM | POA: Diagnosis not present

## 2018-02-27 DIAGNOSIS — R7303 Prediabetes: Secondary | ICD-10-CM | POA: Diagnosis not present

## 2018-02-27 DIAGNOSIS — Z8546 Personal history of malignant neoplasm of prostate: Secondary | ICD-10-CM | POA: Insufficient documentation

## 2018-02-27 DIAGNOSIS — G4733 Obstructive sleep apnea (adult) (pediatric): Secondary | ICD-10-CM | POA: Diagnosis not present

## 2018-02-27 DIAGNOSIS — F4310 Post-traumatic stress disorder, unspecified: Secondary | ICD-10-CM | POA: Insufficient documentation

## 2018-02-27 DIAGNOSIS — N3010 Interstitial cystitis (chronic) without hematuria: Secondary | ICD-10-CM | POA: Diagnosis not present

## 2018-02-27 DIAGNOSIS — E7800 Pure hypercholesterolemia, unspecified: Secondary | ICD-10-CM | POA: Diagnosis not present

## 2018-02-27 DIAGNOSIS — F419 Anxiety disorder, unspecified: Secondary | ICD-10-CM | POA: Diagnosis not present

## 2018-02-27 DIAGNOSIS — I10 Essential (primary) hypertension: Secondary | ICD-10-CM | POA: Insufficient documentation

## 2018-02-27 DIAGNOSIS — E785 Hyperlipidemia, unspecified: Secondary | ICD-10-CM | POA: Diagnosis not present

## 2018-02-27 DIAGNOSIS — Z6832 Body mass index (BMI) 32.0-32.9, adult: Secondary | ICD-10-CM | POA: Diagnosis not present

## 2018-02-27 HISTORY — DX: Unspecified osteoarthritis, unspecified site: M19.90

## 2018-02-27 HISTORY — DX: Malignant neoplasm of unspecified part of right bronchus or lung: C34.91

## 2018-02-27 HISTORY — DX: Anemia, unspecified: D64.9

## 2018-02-27 HISTORY — DX: Interstitial cystitis (chronic) without hematuria: N30.10

## 2018-02-27 HISTORY — DX: Plantar fascial fibromatosis: M72.2

## 2018-02-27 HISTORY — DX: Unspecified tear of unspecified meniscus, current injury, right knee, initial encounter: S83.206A

## 2018-02-27 HISTORY — DX: Pain in left foot: M79.672

## 2018-02-27 HISTORY — DX: Localized edema: R60.0

## 2018-02-27 HISTORY — DX: Atherosclerosis of aorta: I70.0

## 2018-02-27 HISTORY — DX: Post-traumatic stress disorder, unspecified: F43.10

## 2018-02-27 HISTORY — PX: CYSTO WITH HYDRODISTENSION: SHX5453

## 2018-02-27 LAB — POCT I-STAT 4, (NA,K, GLUC, HGB,HCT)
Glucose, Bld: 90 mg/dL (ref 70–99)
HCT: 36 % — ABNORMAL LOW (ref 39.0–52.0)
HEMOGLOBIN: 12.2 g/dL — AB (ref 13.0–17.0)
POTASSIUM: 4.1 mmol/L (ref 3.5–5.1)
Sodium: 141 mmol/L (ref 135–145)

## 2018-02-27 SURGERY — CYSTOSCOPY, WITH BLADDER HYDRODISTENSION
Anesthesia: General

## 2018-02-27 MED ORDER — LIDOCAINE 2% (20 MG/ML) 5 ML SYRINGE
INTRAMUSCULAR | Status: AC
  Filled 2018-02-27: qty 5

## 2018-02-27 MED ORDER — DEXAMETHASONE SODIUM PHOSPHATE 10 MG/ML IJ SOLN
INTRAMUSCULAR | Status: DC | PRN
  Administered 2018-02-27: 10 mg via INTRAVENOUS

## 2018-02-27 MED ORDER — SODIUM CHLORIDE 0.9% FLUSH
3.0000 mL | Freq: Two times a day (BID) | INTRAVENOUS | Status: DC
  Filled 2018-02-27: qty 3

## 2018-02-27 MED ORDER — ACETAMINOPHEN 650 MG RE SUPP
650.0000 mg | RECTAL | Status: DC | PRN
  Filled 2018-02-27: qty 1

## 2018-02-27 MED ORDER — FENTANYL CITRATE (PF) 100 MCG/2ML IJ SOLN
INTRAMUSCULAR | Status: AC
  Filled 2018-02-27: qty 2

## 2018-02-27 MED ORDER — OXYCODONE HCL 5 MG PO TABS
5.0000 mg | ORAL_TABLET | ORAL | Status: DC | PRN
  Filled 2018-02-27: qty 2

## 2018-02-27 MED ORDER — SODIUM CHLORIDE 0.9% FLUSH
3.0000 mL | INTRAVENOUS | Status: DC | PRN
  Filled 2018-02-27: qty 3

## 2018-02-27 MED ORDER — DEXAMETHASONE SODIUM PHOSPHATE 10 MG/ML IJ SOLN
INTRAMUSCULAR | Status: AC
  Filled 2018-02-27: qty 1

## 2018-02-27 MED ORDER — OXYCODONE HCL 5 MG PO TABS
5.0000 mg | ORAL_TABLET | Freq: Once | ORAL | Status: AC
  Administered 2018-02-27: 2.5 mg via ORAL
  Filled 2018-02-27: qty 1

## 2018-02-27 MED ORDER — STERILE WATER FOR IRRIGATION IR SOLN
Status: DC | PRN
  Administered 2018-02-27: 3000 mL

## 2018-02-27 MED ORDER — ONDANSETRON HCL 4 MG/2ML IJ SOLN
INTRAMUSCULAR | Status: AC
  Filled 2018-02-27: qty 2

## 2018-02-27 MED ORDER — BELLADONNA ALKALOIDS-OPIUM 16.2-60 MG RE SUPP
RECTAL | Status: AC
  Filled 2018-02-27: qty 1

## 2018-02-27 MED ORDER — LIDOCAINE 2% (20 MG/ML) 5 ML SYRINGE
INTRAMUSCULAR | Status: DC | PRN
  Administered 2018-02-27: 30 mg via INTRAVENOUS

## 2018-02-27 MED ORDER — PROPOFOL 500 MG/50ML IV EMUL
INTRAVENOUS | Status: AC
  Filled 2018-02-27: qty 50

## 2018-02-27 MED ORDER — PHENYLEPHRINE HCL 10 MG/ML IJ SOLN
INTRAMUSCULAR | Status: DC | PRN
  Administered 2018-02-27 (×3): 40 ug via INTRAVENOUS

## 2018-02-27 MED ORDER — LACTATED RINGERS IV SOLN
INTRAVENOUS | Status: DC
  Administered 2018-02-27 (×2): via INTRAVENOUS
  Filled 2018-02-27: qty 1000

## 2018-02-27 MED ORDER — FENTANYL CITRATE (PF) 100 MCG/2ML IJ SOLN
25.0000 ug | INTRAMUSCULAR | Status: DC | PRN
  Administered 2018-02-27: 25 ug via INTRAVENOUS
  Filled 2018-02-27: qty 1

## 2018-02-27 MED ORDER — MORPHINE SULFATE (PF) 2 MG/ML IV SOLN
2.0000 mg | INTRAVENOUS | Status: DC | PRN
  Filled 2018-02-27: qty 1

## 2018-02-27 MED ORDER — BELLADONNA ALKALOIDS-OPIUM 16.2-60 MG RE SUPP
RECTAL | Status: DC | PRN
  Administered 2018-02-27: 1 via RECTAL

## 2018-02-27 MED ORDER — PROPOFOL 10 MG/ML IV BOLUS
INTRAVENOUS | Status: DC | PRN
  Administered 2018-02-27: 150 mg via INTRAVENOUS

## 2018-02-27 MED ORDER — PHENAZOPYRIDINE HCL 200 MG PO TABS
ORAL | Status: DC | PRN
  Administered 2018-02-27: 10:00:00 via INTRAVESICAL

## 2018-02-27 MED ORDER — ACETAMINOPHEN 325 MG PO TABS
650.0000 mg | ORAL_TABLET | ORAL | Status: DC | PRN
  Filled 2018-02-27: qty 2

## 2018-02-27 MED ORDER — OXYCODONE HCL 5 MG PO TABS
ORAL_TABLET | ORAL | Status: AC
  Filled 2018-02-27: qty 1

## 2018-02-27 MED ORDER — CEFAZOLIN SODIUM-DEXTROSE 2-4 GM/100ML-% IV SOLN
2.0000 g | INTRAVENOUS | Status: AC
  Administered 2018-02-27: 2 g via INTRAVENOUS
  Filled 2018-02-27: qty 100

## 2018-02-27 MED ORDER — CEFAZOLIN SODIUM-DEXTROSE 2-4 GM/100ML-% IV SOLN
INTRAVENOUS | Status: AC
  Filled 2018-02-27: qty 100

## 2018-02-27 MED ORDER — ONDANSETRON HCL 4 MG/2ML IJ SOLN
INTRAMUSCULAR | Status: DC | PRN
  Administered 2018-02-27: 4 mg via INTRAVENOUS

## 2018-02-27 MED ORDER — BUPIVACAINE HCL 0.5 % IJ SOLN: INTRAMUSCULAR | Status: DC | PRN

## 2018-02-27 MED ORDER — FENTANYL CITRATE (PF) 100 MCG/2ML IJ SOLN
INTRAMUSCULAR | Status: DC | PRN
  Administered 2018-02-27: 100 ug via INTRAVENOUS

## 2018-02-27 MED ORDER — ONDANSETRON HCL 4 MG/2ML IJ SOLN
4.0000 mg | Freq: Once | INTRAMUSCULAR | Status: DC | PRN
  Filled 2018-02-27: qty 2

## 2018-02-27 MED ORDER — PHENYLEPHRINE 40 MCG/ML (10ML) SYRINGE FOR IV PUSH (FOR BLOOD PRESSURE SUPPORT)
PREFILLED_SYRINGE | INTRAVENOUS | Status: AC
  Filled 2018-02-27: qty 10

## 2018-02-27 MED ORDER — SODIUM CHLORIDE 0.9 % IV SOLN
250.0000 mL | INTRAVENOUS | Status: DC | PRN
  Filled 2018-02-27: qty 250

## 2018-02-27 MED ORDER — PROPOFOL 10 MG/ML IV BOLUS
INTRAVENOUS | Status: AC
  Filled 2018-02-27: qty 20

## 2018-02-27 SURGICAL SUPPLY — 18 items
BAG DRAIN URO-CYSTO SKYTR STRL (DRAIN) ×3 IMPLANT
BAG DRN UROCATH (DRAIN) ×1
CATH ROBINSON RED A/P 16FR (CATHETERS) ×3 IMPLANT
CLOTH BEACON ORANGE TIMEOUT ST (SAFETY) ×3 IMPLANT
ELECT REM PT RETURN 9FT ADLT (ELECTROSURGICAL) ×3
ELECTRODE REM PT RTRN 9FT ADLT (ELECTROSURGICAL) ×1 IMPLANT
GLOVE SURG SS PI 8.0 STRL IVOR (GLOVE) ×3 IMPLANT
GOWN STRL REUS W/TWL XL LVL3 (GOWN DISPOSABLE) ×3 IMPLANT
KIT TURNOVER CYSTO (KITS) ×3 IMPLANT
MANIFOLD NEPTUNE II (INSTRUMENTS) IMPLANT
NDL SAFETY ECLIPSE 18X1.5 (NEEDLE) ×1 IMPLANT
NEEDLE HYPO 18GX1.5 SHARP (NEEDLE) ×3
NS IRRIG 500ML POUR BTL (IV SOLUTION) IMPLANT
PACK CYSTO (CUSTOM PROCEDURE TRAY) ×3 IMPLANT
SYR 30ML LL (SYRINGE) ×3 IMPLANT
TUBE CONNECTING 12'X1/4 (SUCTIONS)
TUBE CONNECTING 12X1/4 (SUCTIONS) IMPLANT
WATER STERILE IRR 3000ML UROMA (IV SOLUTION) ×3 IMPLANT

## 2018-02-27 NOTE — H&P (Signed)
CC: I have prostate cancer.  HPI: Jonathan Medina is a 84 year-old male established patient who is here evaluation for treatment of prostate cancer.  10/25/17: Jonathan Medina returns today in f/u. He has chronic bladder inflammation and a history of prostate cancer. he has received EXRT for the prostate cancer and has finished firmagon in 4/18. The PSA is back down to 0.65 after rising to 0.81 3 months ago from 0.54. His testosterone is up further to 138 from 88 3 months ago. He finished RTx on Nov 09 2015. He reports near resolution of the hot flashes but he has some cold chills. He remains fatigued. He has been having some hematuria again since the last visit. The bleeding is moderate after he mows the lawn.    His most recent PSA is 0.65.   He has undergone External Beam Radiation Therapy for treatment. He has undergone Hormonal Therapy for treatment.     CC/HPI: I have interstitial cystitis.     01/16/18: He presents today with complaints of progressive dysuria and lower urinary tract symptoms since being seen in April. He complains of constant penile and bladder pain. He states that nothing seems to make the pain better or worse. He has tried multiple therapies in the past. He denies any difficulties voiding. He does note intermittent gross hematuria, which he states is worse after use of riding lawn mower. He feels he empties well. No fever, chills, perineal pain, nausea, or vomiting.   10/25/17: His bladder symptoms are stable on an IC diet. He can be active and not have to void but he still has nocturia q1hr with urgency. He is having increased pain has pain from the sphincter up through the penis. He has a good flow. he is on a CPAP machine for sleep apnea. He has been using Nucynta sparingly which is about the only thing that helps and he is just using it prn. He has BPH with BOO and remains on tamsulosin. Past note reviewed and updated.   He underwent cystoscopy with instillation of marcaine  into the bladder on 12/2715. Findings consistent with IC and small capacity bladder (200 ml) were noted on the op note. There was a stricture at the membranous urethra which was diaphanous and easily disrupted during the procedure as well. He got a benefit from the procedure. He has not tolerated a variety of meds including Azo, Uribel and Oxybutynin.        ALLERGIES: Betadine EPINEPHrine SOLN Iodine SOLN IVP Dye Myrbetriq TB24 nisoldipine Shellfish Simvastatin Statins     MEDICATIONS: Tamsulosin Hcl 0.4 mg capsule 1 capsule PO Daily  Coreg Cr 20 mg capsule,extended release multiphase 24hr  Cozaar 50 mg tablet  Norvasc 10 mg tablet  Nucynta 50 mg tablet 1 tablet PO Daily PRN     GU PSH: Cysto Bladder Ureth Biopsy - 2016 Cysto Fulgurate < 0.5 cm - 2017 Cystoscopy Hydrodistention - 2017 Cystoscopy TURBT 2-5 cm - 2016 Prostate Needle Biopsy - 2016, 2015, 2012      [facility] Notes: Cystoscopy With Biopsy, Needle Biopsy Of Prostate, Cystoscopy With Fulguration Medium Lesion (2-5cm), Biopsy Of The Prostate Needle, Forearm Nonunion Repair, Biopsy Of The Prostate Needle, Surgery Epididymis Excision Of Spermatocele, Lung Surgery, Complete Colonoscopy, Tonsillectomy   NON-GU PSH: Diagnostic Colonoscopy - 2008 Lung Surgery (Unspecified) - 2009 Remove Tonsils - 2008    GU PMH: Interstitial Cystitis (with hematuria) (Stable), He has stable symptoms with recurrent hematuria. - 10/25/2017, (Stable), He continues to have pain and  nocturia. I discussed repeating the hydrodistention but he wants to hold off of that. I have refilled the Nucynta which he has been using sparingly but he wants to keep it on hand. , - 07/19/2017, His voiding symptoms have improved but he has persistent nocturia. I discussed adding possibly oxybutynin at bedtime but he is not interested. , - 03/20/2017 (Stable), He has had no gross hematuria but has persistent frequency and nocturia. he will stay on his IC diet. , - 11/24/2016  (Improving), He has improved but persistent symptoms but he is doing better with that. , - 07/25/2016 (Improving), - 04/27/2016, 09-20-15 Prostate Cancer (Improving), His PSA is coming back down with some recovery of the testosterone. I will have him return in 6 months with a PSA and testosterone. - 10/25/2017, His PSA is coming back up with the rising testosterone which is no longer castrate. I will have him return in 3 months with a PSA and testosterone. , - 07/19/2017, His PSA is up slightly despite a castrate T. I will repeat it in 4 months. , - 03/20/2017 (Stable), His PSA continues to decline. He is having a lot of fatigue on the firmagon. I am going to hold that and watch his PSA and testosterone and resume it if need be, but his testosterone will probably stay suppressed for several more months. , - 11/24/2016 (Improving), PSA continues to fall on firmagon post EXRT., - 07/25/2016 (Improving), - 04/27/2016 (Stable), His PSA is down to 2.00, - 09-20-15, 09-20-15, Prostate cancer, - 2015/09/20 Chronic cystitis (with hematuria) (Worsening), He has increased bleeding from the bladder following radiation. 2015/09/20 Suprapubic pain (Worsening), The pain has become for severe. - 09-20-2015 Dysuria, Dysuria - 2015/09/20 Gross hematuria, Gross hematuria - 09-20-15 Oth GU systems Signs/Symptoms, Bladder pain - 09-20-15 Other microscopic hematuria, Microscopic hematuria - 09/20/2015 Nocturia, Nocturia - 09-20-2015 Urinary Frequency, Urinary frequency - 09-20-15 Elevated PSA, Elevated prostate specific antigen (PSA) - 09/20/14 BPH w/LUTS, Benign prostatic hyperplasia with urinary obstruction - 2014-09-20 Bladder disorder, Unspec, Bladder disorder - 09-20-2014 Primary hypogonadism, Hypogonadism, testicular - 09/20/2014 Inflammatory Disease Prostate, Unspec, Prostatitis - 2014/09/20 Urinary Tract Inf, Unspec site, Pyuria - 09/19/2013 Personal Hx Urinary Tract Infections, History of urinary tract infection - September 19, 2013 CIS of prostate/prostatic urethra, PIN III (prostatic intraepithelial neoplasia III)  - 09/19/2012 ED due to arterial insufficiency, Erectile dysfunction due to arterial insufficiency - 19-Sep-2012      PMH Notes:  2006-09-14 08:34:05 - Note: Benign Polyps Of The Large Intestine  2011-05-18 15:35:34 - Note: Lung Cancer   NON-GU PMH: Low testosterone (Improving) - 10/25/2017, - 03/20/2017 Encounter for general adult medical examination without abnormal findings, Encounter for preventive health examination - 19-Sep-2013 COPD, Chronic Obstructive Pulmonary Disease - September 19, 2012 Gout, Gout - 09/19/2012 Hereditary and idiopathic neuropathy, unspecified, Idiopathic Peripheral Neuropathy - 2012/09/19 Personal history of other diseases of the circulatory system, History of hypertension - 2012-09-19 Personal history of other endocrine, nutritional and metabolic disease, History of hypercholesterolemia - 2012-09-19    FAMILY HISTORY: Death In The Family Father - Father Death In The Family Mother - Mother Family Health Status Number - Runs In Family Prostate Cancer - Father   SOCIAL HISTORY: Marital Status: Married Preferred Language: English; Race: Black or African American Patient's occupation is/was retired.     Notes: Previous History Of Smoking, Caffeine Use, Marital History - Currently Married, Alcohol Use, Retired From Work   REVIEW OF SYSTEMS:    GU Review Male:   Patient reports frequent urination,  hard to postpone urination, burning/ pain with urination, and get up at night to urinate. Patient denies leakage of urine, stream starts and stops, trouble starting your stream, have to strain to urinate , erection problems, and penile pain.  Gastrointestinal (Upper):   Patient denies nausea, vomiting, and indigestion/ heartburn.  Gastrointestinal (Lower):   Patient denies diarrhea and constipation.  Constitutional:   Patient denies fever, night sweats, weight loss, and fatigue.  Skin:   Patient denies skin rash/ lesion and itching.  Eyes:   Patient denies blurred vision and double vision.  Ears/ Nose/ Throat:   Patient  denies sore throat and sinus problems.  Hematologic/Lymphatic:   Patient denies swollen glands and easy bruising.  Cardiovascular:   Patient denies leg swelling and chest pains.  Respiratory:   Patient denies cough and shortness of breath.  Endocrine:   Patient denies excessive thirst.  Musculoskeletal:   Patient denies back pain and joint pain.  Neurological:   Patient denies headaches and dizziness.  Psychologic:   Patient reports anxiety. Patient denies depression.   VITAL SIGNS:      01/16/2018 02:44 PM  Weight 216 lb / 97.98 kg  Height 68 in / 172.72 cm  BP 120/73 mmHg  Pulse 67 /min  Temperature 97.7 F / 36.5 C  BMI 32.8 kg/m   GU PHYSICAL EXAMINATION:    Scrotum: No lesions. No edema. No cysts. No warts.  Epididymides: Right: no spermatocele, no masses, no cysts, no tenderness, no induration, no enlargement. Left: no spermatocele, no masses, no cysts, no tenderness, no induration, no enlargement.  Testes: No tenderness, no swelling, no enlargement left testes. No tenderness, no swelling, no enlargement right testes. Normal location left testes. Normal location right testes. No mass, no cyst, no varicocele, no hydrocele left testes. No mass, no cyst, no varicocele, no hydrocele right testes.  Urethral Meatus: Normal size. No lesion, no wart, no discharge, no polyp. Normal location.  Penis: Penis uncircumcised. No foreskin warts, no cracks. No dorsal peyronie's plaques, no left corporal peyronie's plaques, no right corporal peyronie's plaques, no scarring, no shaft warts. No balanitis, no meatal stenosis.    MULTI-SYSTEM PHYSICAL EXAMINATION:    Constitutional: Well-nourished. No physical deformities. Normally developed. Good grooming.  Respiratory: No labored breathing, no use of accessory muscles.   Cardiovascular: Normal temperature, no swelling, no varicosities.   Skin: No paleness, no jaundice, no cyanosis. No lesion, no ulcer, no rash.  Neurologic / Psychiatric: Oriented to  time, oriented to place, oriented to person. No depression, no anxiety, no agitation.  Gastrointestinal: No mass, no tenderness, no rigidity, non obese abdomen.  Musculoskeletal: Spine, ribs, pelvis no bilateral tenderness. Normal gait and station of head and neck.     PAST DATA REVIEWED:  Source Of History:  Patient   10/11/17 07/12/17 03/13/17 12/26/16 11/17/16 07/18/16 04/19/16 12/15/15  PSA  Total PSA 0.65 ng/mL 0.81 ng/mL 0.54 ng/mL 0.31 ng/mL 0.29 ng/dl 0.40 ng/dl 0.65  2.00     10/11/17 07/12/17 03/13/17 12/26/16 04/19/16 08/03/15 12/03/14 11/21/12  Hormones  Testosterone, Total 138.0 ng/dL 88.2 ng/dL 37.9 ng/dL <10 ng/dL 16.8 pg/dL 38  143  154     PROCEDURES:           PVR Ultrasound - 51798  Scanned Volume: 051 cc         Urinalysis w/Scope Dipstick Dipstick Cont'd Micro  Color: Yellow Bilirubin: Neg mg/dL WBC/hpf: 6 - 10/hpf  Appearance: Clear Ketones: Neg mg/dL RBC/hpf: >60/hpf  Specific Gravity: 1.025 Blood:  3+ ery/uL Bacteria: Rare (0-9/hpf)  pH: <=5.0 Protein: 2+ mg/dL Cystals: NS (Not Seen)  Glucose: Neg mg/dL Urobilinogen: 0.2 mg/dL Casts: NS (Not Seen)    Nitrites: Neg Trichomonas: Not Present    Leukocyte Esterase: Neg leu/uL Mucous: Present      Epithelial Cells: 0 - 5/hpf      Yeast: NS (Not Seen)      Sperm: Not Present    ASSESSMENT:      ICD-10 Details  1 GU:   Interstitial Cystitis (with hematuria) - N30.11   2   Prostate Cancer - C61    PLAN:           Orders Labs Urine Culture          Document Letter(s):  Created for Patient: Clinical Summary         Notes:   Urinalysis today does show pyuria and hematuria. I will send for urine culture today. We will keep him informed of results and plan to treat accordingly, if positive. We discussed that his symptoms are likely multi factorial. Unfortunately, he has tried numerous pharmacological therapies without benefit. He may be candidate for bladder rescue treatment with anesthetic cocktail  pending urine culture results. We discussed repeat cysto with HOD, but he states he does not wish for move forward with this. I encouraged continued hydration and close adherence to IC diet. Given his history, I will discuss symptoms with his urologist. Return recommendations will be pending. Return precuations discussed in the interim.     He didn't improve with anesthetic cocktails and has decided to proceed with a repeat cystoscopy with HOD.

## 2018-02-27 NOTE — Op Note (Signed)
Procedure: 1.  Cystoscopy with hydrodistention of the bladder. 2.  Cystoscopy bladder biopsy and fulguration of Hunner's ulcer. 3.  Instillation of Pyridium and Marcaine.  Preop diagnosis: Interstitial cystitis with Hunner's ulcer.  Postoperative diagnosis: Same.  Surgeon: Dr. Irine Seal.  Anesthesia: General.  Specimen: Bladder biopsy.  Drains: None.  EBL: Minimal.  Complications: None.  Indications: The patient is an 84 year old white male with a long history of painful bladder syndrome who has responded to hydrodistention in the past.  He has a known inflammatory lesion on the right bladder wall.  He is also had radiation therapy for prostate cancer.  He is elected to undergo repeat hydrodistention.  Procedure: He was given 2 g of Ancef.  A general anesthetic was induced.  He was placed in lithotomy position and fitted with PAS hose.  His perineum and genitalia were prepped with Betadine solution and was draped in usual sterile fashion.  Cystoscopy was performed using a 23 [place] scope and 30 degree lens.  Examination revealed a normal urethra.  The external sphincter was somewhat blanched but intact.  The prostatic urethra was short with bilobar hyperplasia with blanching and neovascularity consistent with prior radiation therapy.  Examination of the bladder demonstrated mild trabeculation.  Ureteral orifices were in the normal anatomic position.  The right bladder wall mucosa was erythematous consistent with his prior inflammatory lesion and there was a central scar from prior fulguration.  After completion of inspection the bladder was filled to capacity under 80 cm water pressure which was held for 3 minutes and in the bladder was drained.  His capacity under anesthesia was 200 mL.  Reinspection following hydrodistention demonstrated bleeding from the inflammatory lesion on the right lateral wall with some glomerulations consistent with interstitial cystitis.  There was minimal  mucosal tearing in the inferior medial aspect of the inflammatory lesion.  A cup biopsy of the lesion was obtained and the biopsy site and inflammatory lesion were fulgurated with Bugbee electrode for hemostasis and treatment.  The cystoscope was removed and a 16 French red rubber catheter was passed however it would not go fully to the hub that was felt to be secondary to a small bladder capacity initially.  A second attempt with a 71 [place] coud at a similar result.  He was instilled with 15 mL of half percent Marcaine and 400 mg of crushed Pyridium.  And the catheter was removed.  Some of the fluid drained out but he had been in continent under anesthesia already.  He was taken down from lithotomy position, his anesthetic was reversed and he was moved to recovery in stable condition.  There were no complications.

## 2018-02-27 NOTE — Transfer of Care (Signed)
Immediate Anesthesia Transfer of Care Note  Patient: Jonathan Medina  Procedure(s) Performed: CYSTOSCOPY/HYDRODISTENSION INSTILL MARCAINE AND PYRIDIUM, FULGERATION, BLADDER BIOPSY (N/A )  Patient Location: PACU  Anesthesia Type:General  Level of Consciousness: awake, alert , oriented and patient cooperative  Airway & Oxygen Therapy: Patient Spontanous Breathing and Patient connected to face mask oxygen  Post-op Assessment: Report given to RN, Post -op Vital signs reviewed and stable and Patient moving all extremities X 4  Post vital signs: Reviewed and stable  Last Vitals:  Vitals Value Taken Time  BP 125/58 02/27/2018  9:53 AM  Temp    Pulse 58 02/27/2018  9:55 AM  Resp 17 02/27/2018  9:55 AM  SpO2 100 % 02/27/2018  9:55 AM  Vitals shown include unvalidated device data.  Last Pain:  Vitals:   02/27/18 0646  TempSrc: Oral         Complications: No apparent anesthesia complications

## 2018-02-27 NOTE — Anesthesia Postprocedure Evaluation (Signed)
Anesthesia Post Note  Patient: Jonathan Medina  Procedure(s) Performed: CYSTOSCOPY/HYDRODISTENSION INSTILL MARCAINE AND PYRIDIUM, FULGERATION, BLADDER BIOPSY (N/A )     Patient location during evaluation: PACU Anesthesia Type: General Level of consciousness: awake and alert Pain management: pain level controlled Vital Signs Assessment: post-procedure vital signs reviewed and stable Respiratory status: spontaneous breathing, nonlabored ventilation, respiratory function stable and patient connected to nasal cannula oxygen Cardiovascular status: blood pressure returned to baseline and stable Postop Assessment: no apparent nausea or vomiting Anesthetic complications: no    Last Vitals:  Vitals:   02/27/18 1015 02/27/18 1030  BP: (!) 125/57 116/86  Pulse: 76 (!) 46  Resp: 16 16  Temp:    SpO2: 100% 99%    Last Pain:  Vitals:   02/27/18 1118  TempSrc:   PainSc: [employer] [place]

## 2018-02-27 NOTE — Anesthesia Procedure Notes (Signed)
Procedure Name: LMA Insertion Date/Time: 02/27/2018 9:13 AM Performed by: Alvy Bimler, CRNA Pre-anesthesia Checklist: Patient identified, Patient being monitored, Emergency Drugs available, Timeout performed and Suction available Patient Re-evaluated:Patient Re-evaluated prior to induction Oxygen Delivery Method: Circle System Utilized Preoxygenation: Pre-oxygenation with 100% oxygen Induction Type: IV induction Ventilation: Mask ventilation without difficulty LMA: LMA inserted LMA Size: 4.0 Number of attempts: 1 Placement Confirmation: positive ETCO2 and breath sounds checked- equal and bilateral

## 2018-02-27 NOTE — Discharge Instructions (Addendum)
CYSTOSCOPY HOME CARE INSTRUCTIONS  Activity: Rest for the remainder of the day.  Do not drive or operate equipment today.  You may resume normal activities in one to two days as instructed by your physician.   Meals: Drink plenty of liquids and eat light foods such as gelatin or soup this evening.  You may return to a normal meal plan tomorrow.  Return to Work: You may return to work in one to two days or as instructed by your physician.  Special Instructions / Symptoms: Call your physician if any of these symptoms occur:   -persistent or heavy bleeding  -bleeding which continues after first few urination  -large blood clots that are difficult to pass  -urine stream diminishes or stops completely  -fever equal to or higher than 101 degrees Farenheit.  -cloudy urine with a strong, foul odor  -severe pain  Females should always wipe from front to back after elimination.  You may feel some burning pain when you urinate.  This should disappear with time.  Applying moist heat to the lower abdomen or a hot tub bath may help relieve the pain. \     Post Anesthesia Home Care Instructions  Activity: Get plenty of rest for the remainder of the day. A responsible adult should stay with you for 24 hours following the procedure.  For the next 24 hours, DO NOT: -Drive a car -Paediatric nurse -Drink alcoholic beverages -Take any medication unless instructed by your physician -Make any legal decisions or sign important papers.  Meals: Start with liquid foods such as gelatin or soup. Progress to regular foods as tolerated. Avoid greasy, spicy, heavy foods. If nausea and/or vomiting occur, drink only clear liquids until the nausea and/or vomiting subsides. Call your physician if vomiting continues.  Special Instructions/Symptoms: Your throat may feel dry or sore from the anesthesia or the breathing tube placed in your throat during surgery. If this causes discomfort, gargle with warm salt  water. The discomfort should disappear within 24 hours.  If you had a scopolamine patch placed behind your ear for the management of post- operative nausea and/or vomiting:  1. The medication in the patch is effective for 72 hours, after which it should be removed.  Wrap patch in a tissue and discard in the trash. Wash hands thoroughly with soap and water. 2. You may remove the patch earlier than 72 hours if you experience unpleasant side effects which may include dry mouth, dizziness or visual disturbances. 3. Avoid touching the patch. Wash your hands with soap and water after contact with the patch.

## 2018-02-28 ENCOUNTER — Encounter (HOSPITAL_BASED_OUTPATIENT_CLINIC_OR_DEPARTMENT_OTHER): Payer: Self-pay | Admitting: Urology

## 2018-03-01 ENCOUNTER — Emergency Department (HOSPITAL_COMMUNITY): Payer: Medicare Other

## 2018-03-01 ENCOUNTER — Encounter (HOSPITAL_COMMUNITY): Payer: Self-pay | Admitting: *Deleted

## 2018-03-01 ENCOUNTER — Emergency Department (HOSPITAL_COMMUNITY)
Admission: EM | Admit: 2018-03-01 | Discharge: 2018-03-01 | Disposition: A | Discharge status: Home / Self Care | Payer: Medicare Other | Attending: Emergency Medicine | Admitting: Emergency Medicine

## 2018-03-01 DIAGNOSIS — K5900 Constipation, unspecified: Secondary | ICD-10-CM

## 2018-03-01 DIAGNOSIS — I10 Essential (primary) hypertension: Secondary | ICD-10-CM | POA: Diagnosis not present

## 2018-03-01 DIAGNOSIS — D0221 Carcinoma in situ of right bronchus and lung: Secondary | ICD-10-CM | POA: Diagnosis not present

## 2018-03-01 DIAGNOSIS — Z79899 Other long term (current) drug therapy: Secondary | ICD-10-CM | POA: Insufficient documentation

## 2018-03-01 DIAGNOSIS — Z87891 Personal history of nicotine dependence: Secondary | ICD-10-CM | POA: Insufficient documentation

## 2018-03-01 DIAGNOSIS — D075 Carcinoma in situ of prostate: Secondary | ICD-10-CM | POA: Diagnosis not present

## 2018-03-01 DIAGNOSIS — J449 Chronic obstructive pulmonary disease, unspecified: Secondary | ICD-10-CM | POA: Diagnosis not present

## 2018-03-01 NOTE — Discharge Instructions (Signed)
Take over the counter laxative (such as miralax, milk of magnesia, senokot) AND an enema or dulcolax suppository today and repeat both tomorrow.  Begin to take over the counter stool softener (such as colace or miralax), as directed on packaging, for the next month.  Continue to take your usual prescriptions as previously directed.  Call your regular medical doctor tomorrow to schedule a follow up appointment within the next 3 days.  Return to the Emergency Department immediately if worsening.

## 2018-03-01 NOTE — ED Triage Notes (Signed)
Pt states he felt impacted this morning, took an enema and had small hard bowel movement, which he states "were like three marbles". Pt states he still feels like he is impacted. Pt had difficulty urinating earlier today, but states he was able to urinate once he came to the ED. Pt had hydrodistention 2 days ago, pt was given oxycodone after the procedure.
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
## 2018-03-13 ENCOUNTER — Telehealth: Payer: Self-pay | Admitting: Internal Medicine

## 2018-03-13 DIAGNOSIS — C61 Malignant neoplasm of prostate: Secondary | ICD-10-CM | POA: Diagnosis not present

## 2018-03-13 DIAGNOSIS — N3011 Interstitial cystitis (chronic) with hematuria: Secondary | ICD-10-CM | POA: Diagnosis not present

## 2018-03-13 NOTE — Telephone Encounter (Signed)
Copied from [place] [id number]. Topic: Quick Communication - Rx Refill/Question >> Mar 13, 2018 11:39 AM Keene Breath wrote: Medication: amLODipine (NORVASC) 10 MG tablet  Patient called to request a refill for the above medication.  CB# [phone]  Preferred Pharmacy (with phone number or street name): [facility] [place], [place] to [employer] [phone] (Phone) [phone] (Fax)

## 2018-03-13 NOTE — Telephone Encounter (Signed)
Per last ov w/MD he requested pt to come back in June. Will need to make follow-up appt w/dr. [name] for refills.Jonathan KitchenJohny Chess

## 2018-03-13 NOTE — Telephone Encounter (Signed)
Norvasc 10 mg refill Last Refill:03/13/17 # 90 and 3 refills Last OV: 01/15/18 PCP: [place]: [facility]   Prescription expired today 03/13/18.

## 2018-03-14 NOTE — Telephone Encounter (Signed)
Patient scheduled for tomorrow

## 2018-03-15 ENCOUNTER — Ambulatory Visit (INDEPENDENT_AMBULATORY_CARE_PROVIDER_SITE_OTHER): Payer: Medicare Other | Admitting: Internal Medicine

## 2018-03-15 ENCOUNTER — Encounter: Payer: Self-pay | Admitting: Internal Medicine

## 2018-03-15 VITALS — BP 112/60 | HR 71 | Temp 97.6°F | Resp 16 | Ht 68.0 in | Wt 212.1 lb

## 2018-03-15 DIAGNOSIS — Z23 Encounter for immunization: Secondary | ICD-10-CM | POA: Diagnosis not present

## 2018-03-15 DIAGNOSIS — I10 Essential (primary) hypertension: Secondary | ICD-10-CM

## 2018-03-15 MED ORDER — CARVEDILOL PHOSPHATE ER 20 MG PO CP24: 20.0000 mg | ORAL_CAPSULE | Freq: Every morning | ORAL | 1 refills | Status: DC

## 2018-03-15 NOTE — Patient Instructions (Signed)

## 2018-03-15 NOTE — Progress Notes (Signed)
Subjective:  Patient ID: Jonathan Medina, male    DOB: [birth year]  Age: 84 y.o. MRN: [id number]  CC: Hypertension   HPI Jonathan Medina presents for f/up - He complains that his systolic blood pressure was down to 109 at home about a week or 2 ago.  He denies any recent episodes of dizziness or lightheadedness.  He is status post a cystoscopy which was negative for malignancy.  He tells me he is been diagnosed with interstitial cystitis.  He continues to complain of a multitude of bladder symptoms.  Outpatient Medications Prior to Visit  Medication Sig Dispense Refill  . Tamsulosin HCl (FLOMAX) 0.4 MG CAPS Take 0.4 mg by mouth daily after breakfast.     . amLODipine (NORVASC) 10 MG tablet TAKE 1 TABLET DAILY IN THE MORNING 90 tablet 3  . carvedilol (COREG CR) 20 MG 24 hr capsule TAKE 1 CAPSULE EVERY       MORNING 90 capsule 1  . losartan (COZAAR) 100 MG tablet TAKE 1 TABLET EVERY MORNING 90 tablet 1   No facility-administered medications prior to visit.     ROS Review of Systems  Constitutional: Negative for chills, diaphoresis and fatigue.  HENT: Negative.   Eyes: Negative for visual disturbance.  Respiratory: Negative for cough, chest tightness, shortness of breath and wheezing.   Cardiovascular: Negative for chest pain, palpitations and leg swelling.  Gastrointestinal: Negative for abdominal pain, constipation, diarrhea, nausea and vomiting.  Genitourinary: Positive for difficulty urinating and frequency. Negative for decreased urine volume, dysuria, flank pain, hematuria and urgency.       "bladder pain"  Musculoskeletal: Negative.  Negative for arthralgias, back pain and neck pain.  Skin: Negative.  Negative for color change and rash.  Neurological: Negative.  Negative for dizziness, weakness, light-headedness and headaches.  Hematological: Negative for adenopathy. Does not bruise/bleed easily.  Psychiatric/Behavioral: Negative.     Objective:  BP 112/60 (BP Location: Right  Arm, Patient Position: Sitting, Cuff Size: Large)   Pulse 71   Temp 97.6 F (36.4 C) (Oral)   Resp 16   Ht 5\' 8"  (1.727 m)   Wt 212 lb 1.9 oz (96.2 kg)   SpO2 98%   BMI 32.25 kg/m   BP Readings from Last 3 Encounters:  03/15/18 112/60  03/01/18 (!) 148/84  02/27/18 127/63    Wt Readings from Last 3 Encounters:  03/15/18 212 lb 1.9 oz (96.2 kg)  02/27/18 214 lb 9.6 oz (97.3 kg)  01/15/18 216 lb (98 kg)    Physical Exam  Constitutional: He is oriented to person, place, and time. No distress.  HENT:  Mouth/Throat: Oropharynx is clear and moist. No oropharyngeal exudate.  Eyes: Conjunctivae are normal. No scleral icterus.  Neck: Normal range of motion. Neck supple. No JVD present. No thyromegaly present.  Cardiovascular: Normal rate, regular rhythm and normal heart sounds. Exam reveals no gallop and no friction rub.  No murmur heard. Pulmonary/Chest: Effort normal and breath sounds normal. No respiratory distress. He has no wheezes. He has no rales.  Abdominal: Soft. Bowel sounds are normal. He exhibits no mass. There is no hepatosplenomegaly. There is no tenderness. No hernia.  Musculoskeletal: Normal range of motion. He exhibits no edema, tenderness or deformity.  Lymphadenopathy:    He has no cervical adenopathy.  Neurological: He is alert and oriented to person, place, and time.  Skin: Skin is dry. No rash noted. He is not diaphoretic.  Vitals reviewed.   Lab Results  Component Value  Date   WBC 5.3 06/20/2017   HGB 12.2 (L) 02/27/2018   HCT 36.0 (L) 02/27/2018   PLT 261.0 06/20/2017   GLUCOSE 90 02/27/2018   CHOL 208 (H) 04/24/2017   TRIG 289.0 (H) 04/24/2017   HDL 40.80 04/24/2017   LDLDIRECT 127.0 04/24/2017   LDLCALC 151 (H) 03/01/2016   ALT 10 04/24/2017   AST 11 04/24/2017   NA 141 02/27/2018   K 4.1 02/27/2018   CL 104 04/24/2017   CREATININE 0.96 04/24/2017   BUN 23 04/24/2017   CO2 28 04/24/2017   TSH 1.94 04/24/2017   PSA 0.65 10/11/2017   INR  0.9 08/07/2007   HGBA1C 6.0 04/24/2017    Dg Abd Acute W/chest  Result Date: 03/01/2018 CLINICAL DATA:  Constipation EXAM: DG ABDOMEN ACUTE W/ 1V CHEST COMPARISON:  None. FINDINGS: Moderate stool burden throughout the colon. There is normal bowel gas pattern. No free air. No organomegaly or suspicious calcification. No acute bony abnormality. Heart is upper limits normal in size. Linear scarring or atelectasis in the lung bases. No effusions. No acute bony abnormality. IMPRESSION: Moderate stool burden.  No obstruction or free air. Bibasilar scarring or atelectasis. Electronically Signed   By: Rolm Baptise M.D.   On: 03/01/2018 19:12    Assessment & Plan:   Emoni was seen today for hypertension.  Diagnoses and all orders for this visit:  Need for influenza vaccination -     Flu vaccine HIGH DOSE PF (Fluzone High dose)  Essential hypertension- His blood pressure is over controlled.  I have asked him to stop taking the CCB and ARB.  Will continue carvedilol at the current dose. -     carvedilol (COREG CR) 20 MG 24 hr capsule; Take 1 capsule (20 mg total) by mouth every morning.   I have discontinued Garnie L. Cutting's amLODipine and losartan. I have also changed his carvedilol. Additionally, I am having him maintain his tamsulosin.  Meds ordered this encounter  Medications  . carvedilol (COREG CR) 20 MG 24 hr capsule    Sig: Take 1 capsule (20 mg total) by mouth every morning.    Dispense:  90 capsule    Refill:  1     Follow-up: Return in about 3 months (around 06/14/2018).  [name], MD

## 2018-04-27 DIAGNOSIS — N3011 Interstitial cystitis (chronic) with hematuria: Secondary | ICD-10-CM | POA: Diagnosis not present

## 2018-04-27 DIAGNOSIS — Z8546 Personal history of malignant neoplasm of prostate: Secondary | ICD-10-CM | POA: Diagnosis not present

## 2018-04-27 DIAGNOSIS — E291 Testicular hypofunction: Secondary | ICD-10-CM | POA: Diagnosis not present

## 2018-04-27 DIAGNOSIS — R3121 Asymptomatic microscopic hematuria: Secondary | ICD-10-CM | POA: Diagnosis not present

## 2018-04-27 DIAGNOSIS — N3941 Urge incontinence: Secondary | ICD-10-CM | POA: Diagnosis not present

## 2018-05-15 DIAGNOSIS — N3941 Urge incontinence: Secondary | ICD-10-CM | POA: Diagnosis not present

## 2018-05-22 ENCOUNTER — Ambulatory Visit (INDEPENDENT_AMBULATORY_CARE_PROVIDER_SITE_OTHER): Payer: Medicare Other | Admitting: Internal Medicine

## 2018-05-22 ENCOUNTER — Encounter: Payer: Self-pay | Admitting: Internal Medicine

## 2018-05-22 VITALS — BP 140/80 | HR 83 | Temp 98.5°F | Resp 16 | Ht 68.0 in | Wt 214.0 lb

## 2018-05-22 DIAGNOSIS — E785 Hyperlipidemia, unspecified: Secondary | ICD-10-CM

## 2018-05-22 DIAGNOSIS — D638 Anemia in other chronic diseases classified elsewhere: Secondary | ICD-10-CM | POA: Diagnosis not present

## 2018-05-22 DIAGNOSIS — I10 Essential (primary) hypertension: Secondary | ICD-10-CM

## 2018-05-22 DIAGNOSIS — Z0000 Encounter for general adult medical examination without abnormal findings: Secondary | ICD-10-CM | POA: Diagnosis not present

## 2018-05-22 DIAGNOSIS — R739 Hyperglycemia, unspecified: Secondary | ICD-10-CM | POA: Diagnosis not present
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
## 2018-05-24 ENCOUNTER — Other Ambulatory Visit (INDEPENDENT_AMBULATORY_CARE_PROVIDER_SITE_OTHER): Payer: Medicare Other

## 2018-05-24 DIAGNOSIS — R350 Frequency of micturition: Secondary | ICD-10-CM | POA: Diagnosis not present

## 2018-05-24 DIAGNOSIS — E785 Hyperlipidemia, unspecified: Secondary | ICD-10-CM | POA: Diagnosis not present

## 2018-05-24 DIAGNOSIS — I10 Essential (primary) hypertension: Secondary | ICD-10-CM

## 2018-05-24 DIAGNOSIS — R739 Hyperglycemia, unspecified: Secondary | ICD-10-CM

## 2018-05-24 DIAGNOSIS — D638 Anemia in other chronic diseases classified elsewhere: Secondary | ICD-10-CM

## 2018-05-24 DIAGNOSIS — N3941 Urge incontinence: Secondary | ICD-10-CM | POA: Diagnosis not present

## 2018-05-24 LAB — LIPID PANEL
Cholesterol: 236 mg/dL — ABNORMAL HIGH (ref 0–200)
HDL: 48.7 mg/dL (ref >39.00)
NonHDL: 187.74
Total CHOL/HDL Ratio: 5
Triglycerides: 274 mg/dL — ABNORMAL HIGH (ref 0.0–149.0)
VLDL: 54.8 mg/dL — ABNORMAL HIGH (ref 0.0–40.0)

## 2018-05-24 LAB — CBC WITH DIFFERENTIAL/PLATELET
Basophils Absolute: 0.1 K/uL (ref 0.0–0.1)
Basophils Relative: 1.4 % (ref 0.0–3.0)
Eosinophils Absolute: 0.1 K/uL (ref 0.0–0.7)
Eosinophils Relative: 1.3 % (ref 0.0–5.0)
HCT: 40.6 % (ref 39.0–52.0)
Hemoglobin: 13.6 g/dL (ref 13.0–17.0)
Lymphocytes Relative: 28.8 % (ref 12.0–46.0)
Lymphs Abs: 1.7 K/uL (ref 0.7–4.0)
MCHC: 33.6 g/dL (ref 30.0–36.0)
MCV: 91.5 fl (ref 78.0–100.0)
Monocytes Absolute: 0.5 K/uL (ref 0.1–1.0)
Monocytes Relative: 8.8 % (ref 3.0–12.0)
Neutro Abs: 3.4 K/uL (ref 1.4–7.7)
Neutrophils Relative %: 59.7 % (ref 43.0–77.0)
Platelets: 266 K/uL (ref 150.0–400.0)
RBC: 4.44 Mil/uL (ref 4.22–5.81)
RDW: 13.8 % (ref 11.5–15.5)
WBC: 5.8 K/uL (ref 4.0–10.5)

## 2018-05-24 LAB — BASIC METABOLIC PANEL
BUN: 19 mg/dL (ref 6–23)
CALCIUM: 9.6 mg/dL (ref 8.4–10.5)
CO2: 29 mEq/L (ref 19–32)
CREATININE: 0.97 mg/dL (ref 0.40–1.50)
Chloride: 104 mEq/L (ref 96–112)
GFR: 94.27 mL/min (ref >60.00)
GLUCOSE: 80 mg/dL (ref 70–99)
Potassium: 4.5 mEq/L (ref 3.5–5.1)
Sodium: 140 mEq/L (ref 135–145)

## 2018-05-24 LAB — HEMOGLOBIN A1C: Hgb A1c MFr Bld: 5.7 % (ref 4.6–6.5)

## 2018-05-25 ENCOUNTER — Encounter: Payer: Self-pay | Admitting: Internal Medicine

## 2018-05-25 LAB — LDL CHOLESTEROL, DIRECT: LDL DIRECT: 153 mg/dL

## 2018-05-25 NOTE — Assessment & Plan Note (Signed)

## 2018-05-25 NOTE — Patient Instructions (Signed)

## 2018-05-29 DIAGNOSIS — R310 Gross hematuria: Secondary | ICD-10-CM | POA: Diagnosis not present

## 2018-05-29 DIAGNOSIS — N3941 Urge incontinence: Secondary | ICD-10-CM | POA: Diagnosis not present

## 2018-06-05 DIAGNOSIS — N3941 Urge incontinence: Secondary | ICD-10-CM | POA: Diagnosis not present

## 2018-06-08 DIAGNOSIS — R300 Dysuria: Secondary | ICD-10-CM | POA: Diagnosis not present

## 2018-06-08 DIAGNOSIS — R310 Gross hematuria: Secondary | ICD-10-CM | POA: Diagnosis not present

## 2018-06-08 DIAGNOSIS — N3021 Other chronic cystitis with hematuria: Secondary | ICD-10-CM | POA: Diagnosis not present

## 2018-06-08 DIAGNOSIS — D075 Carcinoma in situ of prostate: Secondary | ICD-10-CM | POA: Diagnosis not present

## 2018-06-12 DIAGNOSIS — N3941 Urge incontinence: Secondary | ICD-10-CM | POA: Diagnosis not present

## 2018-06-19 DIAGNOSIS — N3941 Urge incontinence: Secondary | ICD-10-CM | POA: Diagnosis not present

## 2018-06-19 DIAGNOSIS — R350 Frequency of micturition: Secondary | ICD-10-CM | POA: Diagnosis not present

## 2018-06-28 DIAGNOSIS — R350 Frequency of micturition: Secondary | ICD-10-CM | POA: Diagnosis not present

## 2018-06-28 DIAGNOSIS — N3941 Urge incontinence: Secondary | ICD-10-CM | POA: Diagnosis not present

## 2018-07-05 DIAGNOSIS — N3941 Urge incontinence: Secondary | ICD-10-CM | POA: Diagnosis not present

## 2018-07-05 DIAGNOSIS — R350 Frequency of micturition: Secondary | ICD-10-CM | POA: Diagnosis not present

## 2018-07-09 ENCOUNTER — Other Ambulatory Visit: Payer: Self-pay | Admitting: Internal Medicine

## 2018-07-09 DIAGNOSIS — I10 Essential (primary) hypertension: Secondary | ICD-10-CM

## 2018-07-09 NOTE — Telephone Encounter (Signed)
Copied from [place] [id number]. Topic: Quick Communication - Rx Refill/Question >> Jul 09, 2018  2:24 PM [place], [place] D wrote: Medication: carvedilol (COREG CR) 20 MG 24 hr capsule / [facility] [phone]   Has the patient contacted their pharmacy? Yes  (Agent: If no, request that the patient contact the pharmacy for the refill.) (Agent: If yes, when and what did the pharmacy advise?)  Preferred Pharmacy (with phone number or street name): [place], [place] [phone] (Phone) [phone] (Fax)  Agent: Please be advised that RX refills may take up to 3 business days. We ask that you follow-up with your pharmacy.

## 2018-07-10 DIAGNOSIS — N3941 Urge incontinence: Secondary | ICD-10-CM | POA: Diagnosis not present

## 2018-07-10 DIAGNOSIS — R350 Frequency of micturition: Secondary | ICD-10-CM | POA: Diagnosis not present

## 2018-07-10 MED ORDER — CARVEDILOL PHOSPHATE ER 20 MG PO CP24: 20.0000 mg | ORAL_CAPSULE | Freq: Every morning | ORAL | 0 refills | Status: DC

## 2018-07-10 NOTE — Telephone Encounter (Signed)
Changed pharmacy Requested Prescriptions  Pending Prescriptions Disp Refills  . carvedilol (COREG CR) 20 MG 24 hr capsule 90 capsule 0    Sig: Take 1 capsule (20 mg total) by mouth every morning.     Cardiovascular:  Beta Blockers Failed - 07/10/2018 10:34 AM      Failed - Last BP in normal range    BP Readings from Last 1 Encounters:  05/22/18 140/80         Passed - Last Heart Rate in normal range    Pulse Readings from Last 1 Encounters:  05/22/18 83         Passed - Valid encounter within last 6 months    Recent Outpatient Visits          1 month ago Essential hypertension   [place], [place]   3 months ago Need for influenza vaccination   [place], [place]   5 months ago [place], Marvis Repress, [place]   1 year ago Anemia, chronic disease   [facility] -Mayer Camel, MD   1 year ago Essential hypertension   [facility] -Mayer Camel, MD

## 2018-07-17 DIAGNOSIS — R350 Frequency of micturition: Secondary | ICD-10-CM | POA: Diagnosis not present

## 2018-07-24 DIAGNOSIS — R350 Frequency of micturition: Secondary | ICD-10-CM | POA: Diagnosis not present

## 2018-07-24 DIAGNOSIS — N3941 Urge incontinence: Secondary | ICD-10-CM | POA: Diagnosis not present

## 2018-07-30 DIAGNOSIS — C61 Malignant neoplasm of prostate: Secondary | ICD-10-CM | POA: Diagnosis not present

## 2018-07-30 DIAGNOSIS — N3011 Interstitial cystitis (chronic) with hematuria: Secondary | ICD-10-CM | POA: Diagnosis not present

## 2018-07-30 DIAGNOSIS — R351 Nocturia: Secondary | ICD-10-CM | POA: Diagnosis not present

## 2018-07-30 DIAGNOSIS — R3121 Asymptomatic microscopic hematuria: Secondary | ICD-10-CM | POA: Diagnosis not present

## 2018-07-31 DIAGNOSIS — N3941 Urge incontinence: Secondary | ICD-10-CM | POA: Diagnosis not present

## 2018-08-21 DIAGNOSIS — R350 Frequency of micturition: Secondary | ICD-10-CM | POA: Diagnosis not present

## 2018-08-21 DIAGNOSIS — N3941 Urge incontinence: Secondary | ICD-10-CM | POA: Diagnosis not present

## 2018-08-27 ENCOUNTER — Telehealth: Payer: Self-pay | Admitting: Internal Medicine

## 2018-08-27 NOTE — Telephone Encounter (Signed)
Pt contacted and informed labs are ready for pick up at his convenience.

## 2018-08-27 NOTE — Telephone Encounter (Signed)
Copied from [place] [id number]. Topic: Quick Communication - See Telephone Encounter >> Aug 27, 2018 11:11 AM Robina Ade, Helene Kelp D wrote: CRM for notification. See Telephone encounter for: 08/27/18. Patient called and said that he would like a copy of his lab work for 05/24/18 he said that he needs it to take it to the [facility] in Thursday. Please call patient when its ready.

## 2018-08-30 LAB — HEPATIC FUNCTION PANEL
ALT: 20 (ref 10–40)
AST: 14 (ref 14–40)
Alkaline Phosphatase: 88 (ref 25–125)
BILIRUBIN DIRECT: 0.1 (ref 0.01–0.4)
Bilirubin, Total: 0.5

## 2018-08-30 LAB — CBC AND DIFFERENTIAL
HCT: 41 (ref 41–53)
HEMOGLOBIN: 13.6 (ref 13.5–17.5)
Platelets: 259 (ref 150–399)
WBC: 5.4

## 2018-08-30 LAB — LIPID PANEL
Cholesterol: 223 — AB (ref 0–200)
HDL: 55 (ref 35–70)
LDL CALC: 115
TRIGLYCERIDES: 263 — AB (ref 40–160)

## 2018-08-30 LAB — BASIC METABOLIC PANEL
BUN: 19 (ref 4–21)
Creatinine: 0.9 (ref 0.6–1.3)
Glucose: 84
Potassium: 4.7 (ref 3.4–5.3)
SODIUM: 140 (ref 137–147)

## 2018-08-30 LAB — TSH: TSH: 1.07 (ref 0.41–5.90)

## 2018-08-30 LAB — HEMOGLOBIN A1C: Hemoglobin A1C: 6

## 2018-09-03 ENCOUNTER — Ambulatory Visit: Payer: Self-pay

## 2018-09-03 NOTE — Telephone Encounter (Signed)
Patient called in with c/o "high blood pressure." He says "since November when Dr. [name] took me off Cozaar and Norvasc because of my low BP, my readings have been going up in the 180's on the top and 80's/90's on the bottom. This past weekend, the top number got as high as 200's. I've been having nose bleeds lately, but not sure if it's my blood pressure or my nose being stopped up since I was seen at the [facility] in [place] last week. When I was there, my BP was up in the 190's." I asked about other symptoms, he denies any other symptoms at this time. According to protocol, see PCP within 24 hours, appointment scheduled for tomorrow, 09/04/18 at 1100 with Dr. [name], care advice given, patient verbalized understanding.   Reason for Disposition . Systolic BP  >= 220 OR Diastolic >= 254  Answer Assessment - Initial Assessment Questions 1. BLOOD PRESSURE: "What is the blood pressure?" "Did you take at least two measurements 5 minutes apart?"     184/83 2. ONSET: "When did you take your blood pressure?"     10 minutes 3. HOW: "How did you obtain the blood pressure?" (e.g., visiting nurse, automatic home BP monitor)     Automatic home BP monitor 4. HISTORY: "Do you have a history of high blood pressure?"     Yes 5. MEDICATIONS: "Are you taking any medications for blood pressure?" "Have you missed any doses recently?"     Yes 6. OTHER SYMPTOMS: "Do you have any symptoms?" (e.g., headache, chest pain, blurred vision, difficulty breathing, weakness)     Nose bleed 7. PREGNANCY: "Is there any chance you are pregnant?" "When was your last menstrual period?"     N/A  Protocols used: HIGH BLOOD PRESSURE-A-AH

## 2018-09-04 ENCOUNTER — Encounter: Payer: Self-pay | Admitting: Internal Medicine

## 2018-09-04 ENCOUNTER — Ambulatory Visit (INDEPENDENT_AMBULATORY_CARE_PROVIDER_SITE_OTHER): Payer: Medicare Other | Admitting: Internal Medicine

## 2018-09-04 ENCOUNTER — Other Ambulatory Visit (INDEPENDENT_AMBULATORY_CARE_PROVIDER_SITE_OTHER): Payer: Medicare Other

## 2018-09-04 VITALS — BP 164/102 | HR 72 | Temp 98.1°F | Resp 16 | Ht 68.0 in | Wt 220.5 lb

## 2018-09-04 DIAGNOSIS — I10 Essential (primary) hypertension: Secondary | ICD-10-CM | POA: Diagnosis not present

## 2018-09-04 DIAGNOSIS — R0609 Other forms of dyspnea: Secondary | ICD-10-CM

## 2018-09-04 LAB — URINALYSIS, ROUTINE W REFLEX MICROSCOPIC
BILIRUBIN URINE: NEGATIVE
KETONES UR: NEGATIVE
LEUKOCYTE UA: NEGATIVE
NITRITE: NEGATIVE
Specific Gravity, Urine: 1.02 (ref 1.000–1.030)
TOTAL PROTEIN, URINE-UPE24: NEGATIVE
UROBILINOGEN UA: 0.2 (ref 0.0–1.0)
Urine Glucose: NEGATIVE
pH: 5.5 (ref 5.0–8.0)

## 2018-09-04 LAB — COMPREHENSIVE METABOLIC PANEL
ALT: 13 U/L (ref 0–53)
AST: 13 U/L (ref 0–37)
Albumin: 4.2 g/dL (ref 3.5–5.2)
Alkaline Phosphatase: 76 U/L (ref 39–117)
BUN: 18 mg/dL (ref 6–23)
CALCIUM: 9.4 mg/dL (ref 8.4–10.5)
CHLORIDE: 105 meq/L (ref 96–112)
CO2: 30 meq/L (ref 19–32)
Creatinine, Ser: 0.88 mg/dL (ref 0.40–1.50)
GFR: 99.18 mL/min (ref >60.00)
Glucose, Bld: 68 mg/dL — ABNORMAL LOW (ref 70–99)
POTASSIUM: 4 meq/L (ref 3.5–5.1)
Sodium: 141 mEq/L (ref 135–145)
Total Bilirubin: 0.4 mg/dL (ref 0.2–1.2)
Total Protein: 6.9 g/dL (ref 6.0–8.3)

## 2018-09-04 LAB — CBC WITH DIFFERENTIAL/PLATELET
BASOS PCT: 0.6 % (ref 0.0–3.0)
Basophils Absolute: 0 10*3/uL (ref 0.0–0.1)
EOS PCT: 1.5 % (ref 0.0–5.0)
Eosinophils Absolute: 0.1 10*3/uL (ref 0.0–0.7)
HEMATOCRIT: 41.3 % (ref 39.0–52.0)
HEMOGLOBIN: 13.6 g/dL (ref 13.0–17.0)
LYMPHS PCT: 25.9 % (ref 12.0–46.0)
Lymphs Abs: 1.4 10*3/uL (ref 0.7–4.0)
MCHC: 32.9 g/dL (ref 30.0–36.0)
MCV: 91.2 fl (ref 78.0–100.0)
MONOS PCT: 11.2 % (ref 3.0–12.0)
Monocytes Absolute: 0.6 10*3/uL (ref 0.1–1.0)
NEUTROS ABS: 3.3 10*3/uL (ref 1.4–7.7)
Neutrophils Relative %: 60.8 % (ref 43.0–77.0)
PLATELETS: 252 10*3/uL (ref 150.0–400.0)
RBC: 4.53 Mil/uL (ref 4.22–5.81)
RDW: 13.9 % (ref 11.5–15.5)
WBC: 5.5 10*3/uL (ref 4.0–10.5)

## 2018-09-04 LAB — BRAIN NATRIURETIC PEPTIDE: PRO B NATRI PEPTIDE: 21 pg/mL (ref 0.0–100.0)

## 2018-09-04 LAB — TSH: TSH: 1.22 u[IU]/mL (ref 0.35–4.50)

## 2018-09-04 LAB — TROPONIN I: TNIDX: 0 ug/l (ref 0.00–0.06)

## 2018-09-04 MED ORDER — AZILSARTAN-CHLORTHALIDONE 40-12.5 MG PO TABS: 1.0000 | ORAL_TABLET | Freq: Every day | ORAL | 1 refills | Status: DC

## 2018-09-04 NOTE — Patient Instructions (Signed)

## 2018-09-04 NOTE — Progress Notes (Signed)
Subjective:  Patient ID: Jonathan Medina, male    DOB: May 06, 1930  Age: 85 y.o. MRN: [id number]  CC: Hypertension   HPI Jonathan Medina presents for f/up - He complains that his blood pressure is not adequately well controlled.  At home he consistently has a systolic that is about 993-716.  He is compliant with the carvedilol but is not taking any other antihypertensives.  He complains of fatigue and DOE but he denies CP, edema, headache, blurred vision, or diaphoresis.  Outpatient Medications Prior to Visit  Medication Sig Dispense Refill  . carvedilol (COREG CR) 20 MG 24 hr capsule Take 1 capsule (20 mg total) by mouth every morning. 90 capsule 0  . Tamsulosin HCl (FLOMAX) 0.4 MG CAPS Take 0.4 mg by mouth daily after breakfast.     . NUCYNTA 50 MG tablet Take 50 mg by mouth daily.  0   No facility-administered medications prior to visit.     ROS Review of Systems  Constitutional: Positive for fatigue and unexpected weight change (wt gain).  Eyes: Negative for photophobia and visual disturbance.  Respiratory: Positive for shortness of breath (DOE). Negative for cough, chest tightness and wheezing.   Cardiovascular: Negative for chest pain, palpitations and leg swelling.  Gastrointestinal: Negative for abdominal pain, diarrhea and nausea.  Endocrine: Negative.   Genitourinary: Negative.  Negative for difficulty urinating, dysuria and hematuria.  Musculoskeletal: Negative.  Negative for arthralgias, back pain and neck pain.  Skin: Negative.  Negative for color change.  Neurological: Negative.  Negative for dizziness, weakness, light-headedness and headaches.  Hematological: Negative for adenopathy. Does not bruise/bleed easily.  Psychiatric/Behavioral: Negative.     Objective:  BP (!) 164/102 (BP Location: Left Arm, Patient Position: Sitting, Cuff Size: Normal)   Pulse 72   Temp 98.1 F (36.7 C) (Oral)   Resp 16   Ht 5\' 8"  (1.727 m)   Wt 220 lb 8 oz (100 kg)   SpO2 96%    BMI 33.53 kg/m   BP Readings from Last 3 Encounters:  09/04/18 (!) 164/102  05/22/18 140/80  03/15/18 112/60    Wt Readings from Last 3 Encounters:  09/04/18 220 lb 8 oz (100 kg)  05/22/18 214 lb (97.1 kg)  03/15/18 212 lb 1.9 oz (96.2 kg)    Physical Exam Vitals signs reviewed.  Constitutional:      Appearance: He is obese. He is not ill-appearing or diaphoretic.  HENT:     Nose: Nose normal. No congestion or rhinorrhea.     Mouth/Throat:     Mouth: Mucous membranes are moist.     Pharynx: Oropharynx is clear. No oropharyngeal exudate or posterior oropharyngeal erythema.  Eyes:     General: No scleral icterus.    Conjunctiva/sclera: Conjunctivae normal.  Neck:     Musculoskeletal: Normal range of motion and neck supple. No muscular tenderness.  Cardiovascular:     Rate and Rhythm: Normal rate. Frequent extrasystoles are present.    Heart sounds: Normal heart sounds. No systolic murmur. No diastolic murmur. No gallop.      Comments: EKG ----  Sinus  Rhythm  - frequent PAC s  # PACs = 2. WITHIN NORMAL LIMITS  Pulmonary:     Effort: Pulmonary effort is normal.     Breath sounds: No stridor. No wheezing or rales.  Abdominal:     General: Bowel sounds are normal.     Palpations: There is no hepatomegaly, splenomegaly or mass.     Tenderness: There  is no abdominal tenderness.  Musculoskeletal: Normal range of motion.        General: No swelling.     Right lower leg: No edema.     Left lower leg: No edema.  Skin:    General: Skin is warm and dry.  Neurological:     General: No focal deficit present.     Mental Status: He is oriented to person, place, and time. Mental status is at baseline.     Lab Results  Component Value Date   WBC 5.5 09/04/2018   HGB 13.6 09/04/2018   HCT 41.3 09/04/2018   PLT 252.0 09/04/2018   GLUCOSE 68 (L) 09/04/2018   CHOL 236 (H) 05/24/2018   TRIG 274.0 (H) 05/24/2018   HDL 48.70 05/24/2018   LDLDIRECT 153.0 05/24/2018   LDLCALC  151 (H) 03/01/2016   ALT 13 09/04/2018   AST 13 09/04/2018   NA 141 09/04/2018   K 4.0 09/04/2018   CL 105 09/04/2018   CREATININE 0.88 09/04/2018   BUN 18 09/04/2018   CO2 30 09/04/2018   TSH 1.22 09/04/2018   PSA 0.65 10/11/2017   INR 0.9 08/07/2007   HGBA1C 5.7 05/24/2018    Dg Abd Acute W/chest  Result Date: 03/01/2018 CLINICAL DATA:  Constipation EXAM: DG ABDOMEN ACUTE W/ 1V CHEST COMPARISON:  None. FINDINGS: Moderate stool burden throughout the colon. There is normal bowel gas pattern. No free air. No organomegaly or suspicious calcification. No acute bony abnormality. Heart is upper limits normal in size. Linear scarring or atelectasis in the lung bases. No effusions. No acute bony abnormality. IMPRESSION: Moderate stool burden.  No obstruction or free air. Bibasilar scarring or atelectasis. Electronically Signed   By: Rolm Baptise M.D.   On: 03/01/2018 19:12    Assessment & Plan:   Jonathan Medina was seen today for hypertension.  Diagnoses and all orders for this visit:  Essential hypertension- His blood pressure is not adequately well controlled.  His EKG is negative for LVH or ischemia.  His labs are negative for secondary causes or endorgan damage.  I have asked him to add an ARB and thiazide diuretic to the carvedilol. -     CBC with Differential/Platelet; Future -     Comprehensive metabolic panel; Future -     TSH; Future -     Urinalysis, Routine w reflex microscopic; Future -     EKG 12-Lead -     Azilsartan-Chlorthalidone (EDARBYCLOR) 40-12.5 MG TABS; Take 1 tablet by mouth daily.  DOE (dyspnea on exertion)- His EKG is negative for LVH or ischemia.  His troponin is negative which is reassuring that his symptoms are not related to ischemia.  His BNP is normal which is reassuring that he does not have fluid overload.  I think the DOE is related to obesity and poor conditioning. -     CBC with Differential/Platelet; Future -     Brain natriuretic peptide; Future -      Troponin I -; Future -     EKG 12-Lead   I have discontinued Jonathan Medina's NUCYNTA. I am also having him start on Azilsartan-Chlorthalidone. Additionally, I am having him maintain his tamsulosin and carvedilol.  Meds ordered this encounter  Medications  . Azilsartan-Chlorthalidone (EDARBYCLOR) 40-12.5 MG TABS    Sig: Take 1 tablet by mouth daily.    Dispense:  90 tablet    Refill:  1     Follow-up: Return in about 6 weeks (around 10/16/2018).  [name],  MD

## 2018-09-11 DIAGNOSIS — R350 Frequency of micturition: Secondary | ICD-10-CM | POA: Diagnosis not present

## 2018-09-11 DIAGNOSIS — N3941 Urge incontinence: Secondary | ICD-10-CM | POA: Diagnosis not present

## 2018-09-12 ENCOUNTER — Ambulatory Visit: Payer: Self-pay

## 2018-09-12 NOTE — Telephone Encounter (Signed)
Incoming call from Patient with complaint that Edarbyclor 40mg  is not working for him .  Reports that  It causing Extreme fatigue, and making him itch.  It has lower his blood  pressures.  Reports the following blood pressures:  09/4159/71  165/76  158/75  117/61  149/69  110/60 today 136/87  Patient has scheduled an appointment for 09/17/18 with Dr.  [name] @ 2:30pm with arrival at 2:15 Patient is  Tempted to become non compliant.  Really  dosen't like the way it makes him feel. Really wants  Issue resolved.    Reason for Disposition . Caller has medication question only, adult not sick, and triager answers question  Answer Assessment - Initial Assessment Questions 1. SYMPTOMS: "Do you have any symptoms?"    Irritated with blood Pressure medication 2. SEVERITY: If symptoms are present, ask "Are they mild, moderate or severe?"     severe  Protocols used: MEDICATION QUESTION CALL-A-AH

## 2018-09-13 IMAGING — CR DG ABDOMEN ACUTE W/ 1V CHEST
4 series · 4 of 4 positions shown · non-contrast
Comparison: None.

CLINICAL DATA: Constipation

EXAM:
DG ABDOMEN ACUTE W/ 1V CHEST

[w chest pa]
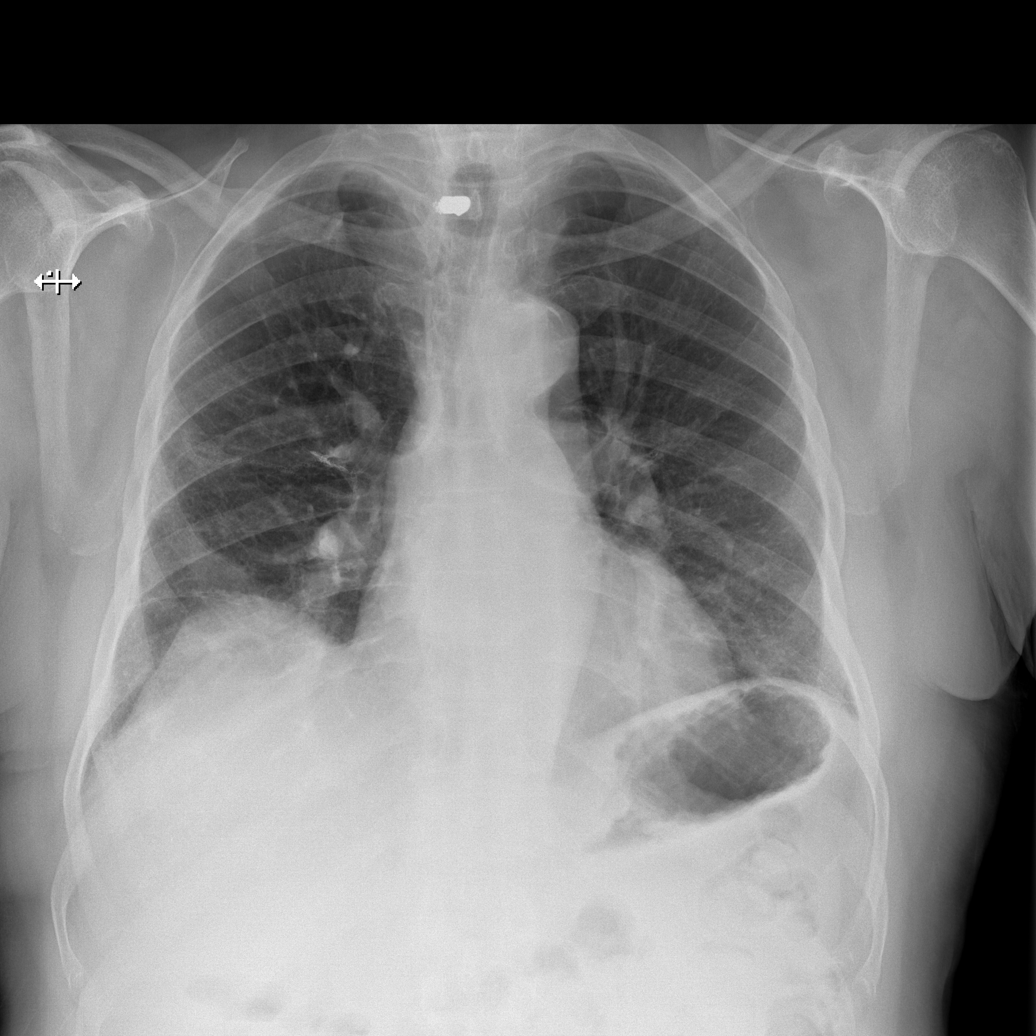

[w abdomen upright]
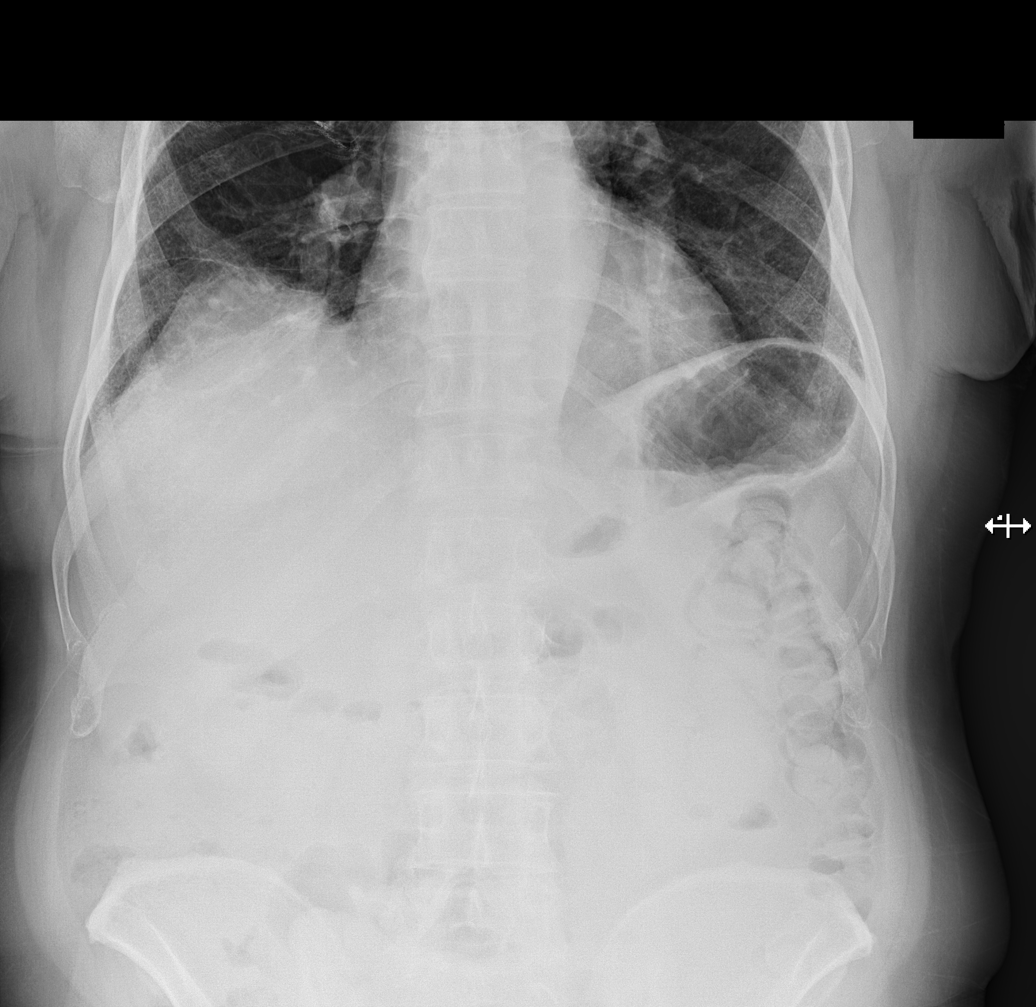

[t abdomen supine (1 of 2)]
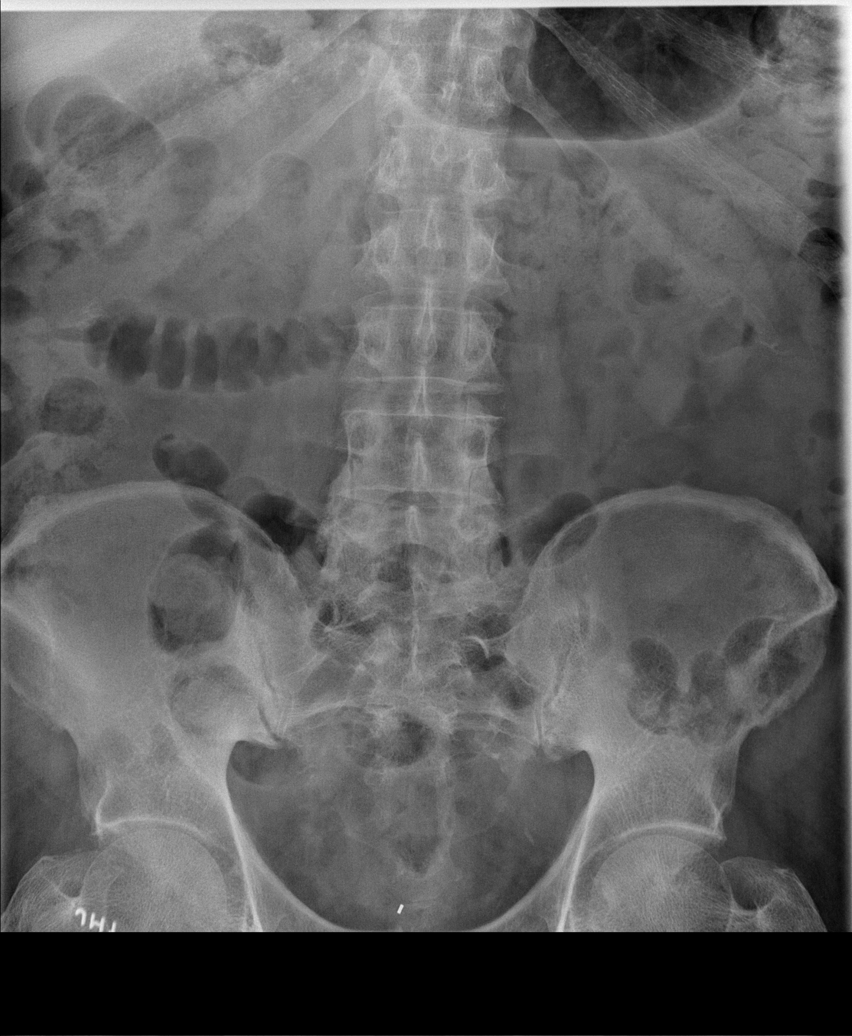

[t abdomen supine (2 of 2)]
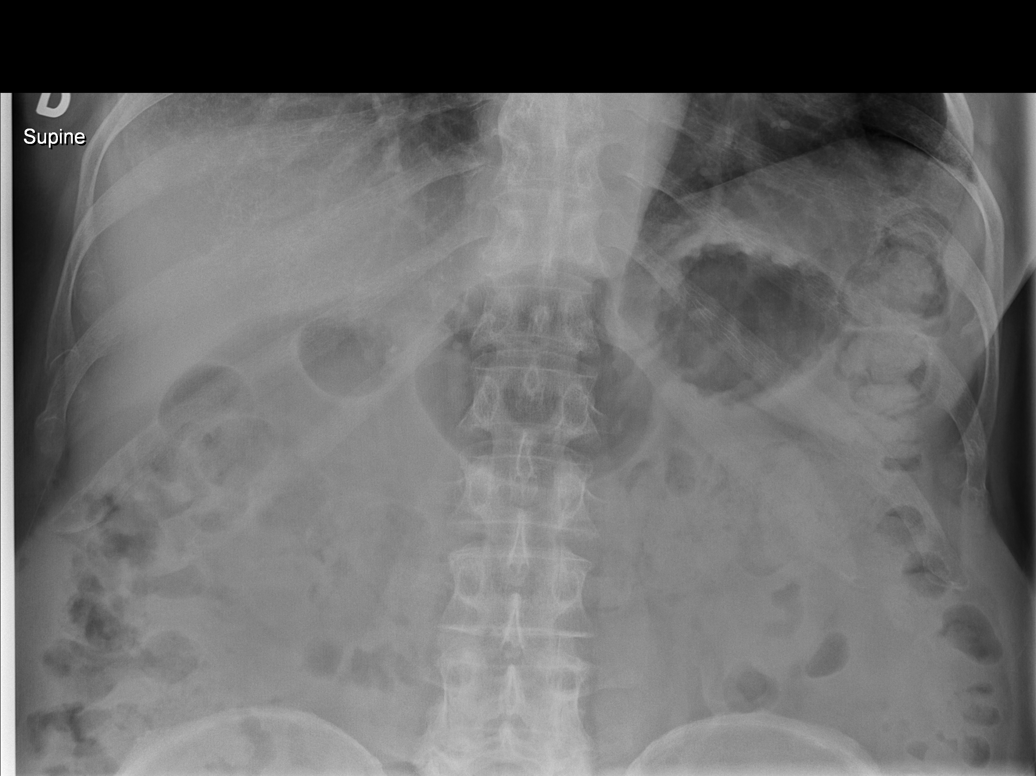

[4 of 4 positions shown; findings below may reference images not displayed]

FINDINGS: Moderate stool burden throughout the colon. There is normal bowel
gas pattern. No free air. No organomegaly or suspicious
calcification. No acute bony abnormality.

Heart is upper limits normal in size. Linear scarring or atelectasis
in the lung bases. No effusions. No acute bony abnormality.
IMPRESSION: Moderate stool burden.  No obstruction or free air.

Bibasilar scarring or atelectasis.

## 2018-09-17 ENCOUNTER — Other Ambulatory Visit: Payer: Self-pay

## 2018-09-17 ENCOUNTER — Ambulatory Visit (INDEPENDENT_AMBULATORY_CARE_PROVIDER_SITE_OTHER): Payer: Medicare Other | Admitting: Internal Medicine

## 2018-09-17 ENCOUNTER — Encounter: Payer: Self-pay | Admitting: Internal Medicine

## 2018-09-17 VITALS — BP 144/74 | HR 58 | Temp 97.5°F | Resp 14 | Ht 68.0 in | Wt 217.8 lb

## 2018-09-17 DIAGNOSIS — I10 Essential (primary) hypertension: Secondary | ICD-10-CM

## 2018-09-17 DIAGNOSIS — F5104 Psychophysiologic insomnia: Secondary | ICD-10-CM | POA: Insufficient documentation

## 2018-09-17 MED ORDER — ESZOPICLONE 2 MG PO TABS: 2.0000 mg | ORAL_TABLET | Freq: Every evening | ORAL | 1 refills | Status: DC | PRN

## 2018-09-17 NOTE — Progress Notes (Signed)
Subjective:  Patient ID: Jonathan Medina, male    DOB: [birth year]  Age: 85 y.o. MRN: [id number]  CC: Hypertension   HPI Jonathan Medina presents for a BP check - He has had a hard time controlling his blood pressure over the last few months and today he informs me that he has been taking melatonin for insomnia.  He tells me his urologist told him to start melatonin.  He was recently seen at the [place] and had lab work that all looks pretty good.  He complains of fatigue but tells me his blood pressure has been well controlled.  He denies headache, blurred vision, chest pain, edema, or shortness of breath.  Outpatient Medications Prior to Visit  Medication Sig Dispense Refill  . Azilsartan-Chlorthalidone (EDARBYCLOR) 40-12.5 MG TABS Take 1 tablet by mouth daily. 90 tablet 1  . carvedilol (COREG CR) 20 MG 24 hr capsule Take 1 capsule (20 mg total) by mouth every morning. 90 capsule 0  . Tamsulosin HCl (FLOMAX) 0.4 MG CAPS Take 0.4 mg by mouth daily after breakfast.      No facility-administered medications prior to visit.     ROS Review of Systems  Constitutional: Positive for fatigue. Negative for appetite change, diaphoresis and unexpected weight change.  HENT: Negative.   Eyes: Negative for visual disturbance.  Respiratory: Negative for cough, chest tightness, shortness of breath and wheezing.   Cardiovascular: Negative for chest pain, palpitations and leg swelling.  Gastrointestinal: Negative for abdominal pain, constipation, diarrhea, nausea and vomiting.  Genitourinary: Negative.  Negative for difficulty urinating.  Musculoskeletal: Negative.  Negative for arthralgias and myalgias.  Skin: Negative.  Negative for color change and pallor.  Neurological: Negative.  Negative for dizziness, weakness, light-headedness and headaches.  Hematological: Negative for adenopathy. Does not bruise/bleed easily.  Psychiatric/Behavioral: Negative.     Objective:  BP (!) 144/74 (BP Location: Left  Arm, Patient Position: Sitting, Cuff Size: Normal)   Pulse (!) 58   Temp (!) 97.5 F (36.4 C) (Oral)   Resp 14   Ht 5\' 8"  (1.727 m)   Wt 217 lb 12 oz (98.8 kg)   BMI 33.11 kg/m   BP Readings from Last 3 Encounters:  09/17/18 (!) 144/74  09/04/18 (!) 164/102  05/22/18 140/80    Wt Readings from Last 3 Encounters:  09/17/18 217 lb 12 oz (98.8 kg)  09/04/18 220 lb 8 oz (100 kg)  05/22/18 214 lb (97.1 kg)    Physical Exam Vitals signs reviewed.  Constitutional:      Appearance: He is obese. He is not ill-appearing or diaphoretic.  HENT:     Nose: Nose normal.     Mouth/Throat:     Mouth: Mucous membranes are moist.     Pharynx: Oropharynx is clear. No oropharyngeal exudate or posterior oropharyngeal erythema.  Eyes:     General: No scleral icterus.    Conjunctiva/sclera: Conjunctivae normal.  Neck:     Musculoskeletal: Normal range of motion and neck supple. No muscular tenderness.  Cardiovascular:     Rate and Rhythm: Normal rate and regular rhythm.     Heart sounds: No murmur. No gallop.   Pulmonary:     Effort: Pulmonary effort is normal.     Breath sounds: Normal breath sounds. No stridor. No wheezing, rhonchi or rales.  Abdominal:     General: Bowel sounds are normal.     Palpations: There is no hepatomegaly, splenomegaly or mass.     Tenderness: There is no  abdominal tenderness. There is no guarding.  Musculoskeletal: Normal range of motion.        General: No swelling.     Right lower leg: No edema.     Left lower leg: No edema.  Skin:    General: Skin is warm and dry.  Neurological:     General: No focal deficit present.     Mental Status: He is oriented to person, place, and time. Mental status is at baseline.     Lab Results  Component Value Date   WBC 5.5 09/04/2018   HGB 13.6 09/04/2018   HCT 41.3 09/04/2018   PLT 252.0 09/04/2018   GLUCOSE 68 (L) 09/04/2018   CHOL 223 (A) 08/30/2018   TRIG 263 (A) 08/30/2018   HDL 55 08/30/2018   LDLDIRECT  153.0 05/24/2018   LDLCALC 115 08/30/2018   ALT 13 09/04/2018   AST 13 09/04/2018   NA 141 09/04/2018   K 4.0 09/04/2018   CL 105 09/04/2018   CREATININE 0.88 09/04/2018   BUN 18 09/04/2018   CO2 30 09/04/2018   TSH 1.22 09/04/2018   PSA 0.65 10/11/2017   INR 0.9 08/07/2007   HGBA1C 6.0 08/30/2018    Dg Abd Acute W/chest  Result Date: 03/01/2018 CLINICAL DATA:  Constipation EXAM: DG ABDOMEN ACUTE W/ 1V CHEST COMPARISON:  None. FINDINGS: Moderate stool burden throughout the colon. There is normal bowel gas pattern. No free air. No organomegaly or suspicious calcification. No acute bony abnormality. Heart is upper limits normal in size. Linear scarring or atelectasis in the lung bases. No effusions. No acute bony abnormality. IMPRESSION: Moderate stool burden.  No obstruction or free air. Bibasilar scarring or atelectasis. Electronically Signed   By: Rolm Baptise M.D.   On: 03/01/2018 19:12    Assessment & Plan:   Larenzo was seen today for hypertension.  Diagnoses and all orders for this visit:  Essential hypertension- His blood pressure is better but still not quite adequately well controlled.  The recent labs done at the [place] are reassuring.  I think the best thing to do at this time is to ask him to stop taking melatonin and to continue his current antihypertensives.  Psychophysiological insomnia -     eszopiclone (LUNESTA) 2 MG TABS tablet; Take 1 tablet (2 mg total) by mouth at bedtime as needed for sleep. Take immediately before bedtime   I am having Jonathan Medina start on eszopiclone. I am also having him maintain his tamsulosin, carvedilol, and Azilsartan-Chlorthalidone.  Meds ordered this encounter  Medications  . eszopiclone (LUNESTA) 2 MG TABS tablet    Sig: Take 1 tablet (2 mg total) by mouth at bedtime as needed for sleep. Take immediately before bedtime    Dispense:  90 tablet    Refill:  1     Follow-up: Return in about 6 months (around 03/20/2019).  [name], MD

## 2018-09-17 NOTE — Patient Instructions (Signed)

## 2018-10-16 ENCOUNTER — Ambulatory Visit: Payer: Medicare Other | Admitting: Internal Medicine

## 2018-12-03 ENCOUNTER — Other Ambulatory Visit: Payer: Self-pay | Admitting: Internal Medicine

## 2018-12-03 DIAGNOSIS — I10 Essential (primary) hypertension: Secondary | ICD-10-CM

## 2019-01-21 DIAGNOSIS — H4901 Third [oculomotor] nerve palsy, right eye: Secondary | ICD-10-CM | POA: Diagnosis not present

## 2019-01-21 DIAGNOSIS — H2513 Age-related nuclear cataract, bilateral: Secondary | ICD-10-CM | POA: Diagnosis not present

## 2019-01-21 DIAGNOSIS — H02831 Dermatochalasis of right upper eyelid: Secondary | ICD-10-CM | POA: Diagnosis not present

## 2019-01-21 DIAGNOSIS — H02834 Dermatochalasis of left upper eyelid: Secondary | ICD-10-CM | POA: Diagnosis not present

## 2019-01-22 ENCOUNTER — Telehealth: Payer: Self-pay | Admitting: Internal Medicine

## 2019-01-22 ENCOUNTER — Other Ambulatory Visit: Payer: Self-pay | Admitting: Internal Medicine

## 2019-01-22 DIAGNOSIS — H4901 Third [oculomotor] nerve palsy, right eye: Secondary | ICD-10-CM | POA: Diagnosis not present

## 2019-01-22 DIAGNOSIS — H02834 Dermatochalasis of left upper eyelid: Secondary | ICD-10-CM | POA: Diagnosis not present

## 2019-01-22 DIAGNOSIS — H02831 Dermatochalasis of right upper eyelid: Secondary | ICD-10-CM | POA: Diagnosis not present

## 2019-01-22 DIAGNOSIS — H2513 Age-related nuclear cataract, bilateral: Secondary | ICD-10-CM | POA: Diagnosis not present

## 2019-01-22 NOTE — Telephone Encounter (Signed)
Jonathan Medina with [facility] calling:  Dr. Katy Fitch saw the pt today and has a pupil sparing right third nerve palsy and he needs to see a neurologist.  Dr. Katy Fitch wants to know if PCP would like to facilitate referral to [facility] or if PCP wants Dr. Katy Fitch to facilitate that referral.

## 2019-01-22 NOTE — Telephone Encounter (Signed)
Dr. Katy Fitch is recommending pt see a neurologist. Would you like to enter referral or would you rather Dr. Katy Fitch enter the referral?

## 2019-01-23 NOTE — Telephone Encounter (Signed)
Left detailed msg on vm at [facility] care to let them know we put the referral in and sent to [facility]rology

## 2019-01-28 ENCOUNTER — Telehealth: Payer: Medicare Other | Admitting: Neurology
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
## 2019-02-27 ENCOUNTER — Encounter: Payer: Self-pay | Admitting: Neurology

## 2019-02-27 ENCOUNTER — Ambulatory Visit (INDEPENDENT_AMBULATORY_CARE_PROVIDER_SITE_OTHER): Payer: Medicare Other | Admitting: Neurology

## 2019-02-27 ENCOUNTER — Other Ambulatory Visit: Payer: Self-pay

## 2019-02-27 ENCOUNTER — Other Ambulatory Visit (INDEPENDENT_AMBULATORY_CARE_PROVIDER_SITE_OTHER): Payer: Medicare Other

## 2019-02-27 DIAGNOSIS — H4901 Third [oculomotor] nerve palsy, right eye: Secondary | ICD-10-CM

## 2019-02-27 DIAGNOSIS — Z8669 Personal history of other diseases of the nervous system and sense organs: Secondary | ICD-10-CM

## 2019-02-27 MED ORDER — DIAZEPAM 5 MG PO TABS: ORAL_TABLET | ORAL | 0 refills | Status: DC

## 2019-02-27 NOTE — Patient Instructions (Addendum)
1.  We will check MRI of brain with and without contrast.  Take diazepam thirty minutes prior to MRI.  You must have a driver. 2.  We will check blood work:  ANA with reflex, ACE, Lyme 3.  Further recommendations pending results.

## 2019-02-28 ENCOUNTER — Telehealth: Payer: Self-pay | Admitting: Neurology

## 2019-02-28 NOTE — Telephone Encounter (Signed)
Yes, let's check the hospital for a sooner appointment.

## 2019-02-28 NOTE — Telephone Encounter (Signed)
Is this okay to wait till this time?

## 2019-02-28 NOTE — Telephone Encounter (Signed)
Patient said they didn't have an appt for an MRI until 03/29/19. He wants to make sure that is okay. He didn't know if it needed to be done sooner. He asked if he needed to schedule with the Hospital. Thanks!

## 2019-03-01 ENCOUNTER — Telehealth: Payer: Self-pay

## 2019-03-01 LAB — LYME AB/WESTERN BLOT REFLEX
LYME DISEASE AB, QUANT, IGM: <0.8 index (ref 0.00–0.79)
Lyme IgG/IgM Ab: <0.91 {ISR} (ref 0.00–0.90)

## 2019-03-01 LAB — ANTINUCLEAR ANTIBODIES, IFA: ANA Titer 1: NEGATIVE

## 2019-03-01 LAB — ANGIOTENSIN CONVERTING ENZYME: Angio Convert Enzyme: 28 U/L (ref 14–82)

## 2019-03-01 NOTE — Telephone Encounter (Signed)
-----   Message from Pieter Partridge, DO sent at 03/01/2019  3:04 PM EDT ----- Labs are normal

## 2019-03-01 NOTE — Telephone Encounter (Signed)
Patient notified of results.

## 2019-03-01 NOTE — Telephone Encounter (Signed)
Patient notified

## 2019-03-17 ENCOUNTER — Other Ambulatory Visit: Payer: Self-pay | Admitting: Internal Medicine

## 2019-03-17 DIAGNOSIS — I10 Essential (primary) hypertension: Secondary | ICD-10-CM

## 2019-03-29 ENCOUNTER — Other Ambulatory Visit: Payer: Self-pay

## 2019-03-29 ENCOUNTER — Ambulatory Visit
Admission: RE | Admit: 2019-03-29 | Discharge: 2019-03-29 | Disposition: A | Discharge status: Home / Self Care | Payer: Medicare Other | Source: Ambulatory Visit | Attending: Neurology | Admitting: Neurology

## 2019-03-29 DIAGNOSIS — H4901 Third [oculomotor] nerve palsy, right eye: Secondary | ICD-10-CM

## 2019-03-29 DIAGNOSIS — G939 Disorder of brain, unspecified: Secondary | ICD-10-CM | POA: Diagnosis not present

## 2019-03-29 DIAGNOSIS — Z8669 Personal history of other diseases of the nervous system and sense organs: Secondary | ICD-10-CM

## 2019-03-29 MED ORDER — GADOBENATE DIMEGLUMINE 529 MG/ML IV SOLN
20.0000 mL | Freq: Once | INTRAVENOUS | Status: AC | PRN
  Administered 2019-03-29: 15:00:00 20 mL via INTRAVENOUS

## 2019-04-01 ENCOUNTER — Telehealth: Payer: Self-pay | Admitting: *Deleted

## 2019-04-01 DIAGNOSIS — H4901 Third [oculomotor] nerve palsy, right eye: Secondary | ICD-10-CM | POA: Diagnosis not present

## 2019-04-01 DIAGNOSIS — H02831 Dermatochalasis of right upper eyelid: Secondary | ICD-10-CM | POA: Diagnosis not present

## 2019-04-01 DIAGNOSIS — H02834 Dermatochalasis of left upper eyelid: Secondary | ICD-10-CM | POA: Diagnosis not present

## 2019-04-01 DIAGNOSIS — H2513 Age-related nuclear cataract, bilateral: Secondary | ICD-10-CM | POA: Diagnosis not present

## 2019-04-01 NOTE — Telephone Encounter (Signed)
Called and spoke to patient and relayed following info from MD result notes :  "MRI of brain shows no cause or abnormality to explain the third nerve palsy of the eye. Likely idiopathic (something where we don't find the specific cause). There is an incidental small meningioma which is a benign mass. I don't think it is anything to worry about."  Patient thanked me for the information and wanted me to tell Dr. Tomi Likens that his vision problem started July 9 and it now is better as of September 10. He states he is doing much better now.

## 2019-04-08 DIAGNOSIS — Z23 Encounter for immunization: Secondary | ICD-10-CM | POA: Diagnosis not present

## 2019-05-09 ENCOUNTER — Other Ambulatory Visit: Payer: Self-pay

## 2019-05-27 ENCOUNTER — Other Ambulatory Visit: Payer: Self-pay | Admitting: Internal Medicine

## 2019-05-27 DIAGNOSIS — I10 Essential (primary) hypertension: Secondary | ICD-10-CM

## 2019-05-27 NOTE — Telephone Encounter (Signed)
Medication Refill - Medication: carvedilol (COREG CR) 20 MG 24 hr capsule for 90 days  Has the patient contacted their pharmacy? Yes.   (Agent: If no, request that the patient contact the pharmacy for the refill.) (Agent: If yes, when and what did the pharmacy advise?)  Preferred Pharmacy (with phone number or street name): [place], [place]  Agent: Please be advised that RX refills may take up to 3 business days. We ask that you follow-up with your pharmacy.

## 2019-06-06 ENCOUNTER — Telehealth: Payer: Self-pay | Admitting: Internal Medicine

## 2019-06-06 NOTE — Telephone Encounter (Signed)
Patient declined AWV at this time. SF °

## 2019-07-17 DIAGNOSIS — C61 Malignant neoplasm of prostate: Secondary | ICD-10-CM | POA: Diagnosis not present

## 2019-07-17 LAB — PSA: PSA: 0.54

## 2019-07-31 DIAGNOSIS — N3011 Interstitial cystitis (chronic) with hematuria: Secondary | ICD-10-CM | POA: Diagnosis not present

## 2019-07-31 DIAGNOSIS — N3941 Urge incontinence: Secondary | ICD-10-CM | POA: Diagnosis not present

## 2019-07-31 DIAGNOSIS — R3982 Chronic bladder pain: Secondary | ICD-10-CM | POA: Diagnosis not present

## 2019-07-31 DIAGNOSIS — Z8546 Personal history of malignant neoplasm of prostate: Secondary | ICD-10-CM | POA: Diagnosis not present

## 2019-07-31 DIAGNOSIS — R3121 Asymptomatic microscopic hematuria: Secondary | ICD-10-CM | POA: Diagnosis not present

## 2019-09-10 ENCOUNTER — Encounter: Payer: Self-pay | Admitting: Internal Medicine

## 2019-09-10 ENCOUNTER — Ambulatory Visit (INDEPENDENT_AMBULATORY_CARE_PROVIDER_SITE_OTHER): Payer: Medicare Other | Admitting: Internal Medicine

## 2019-09-10 ENCOUNTER — Other Ambulatory Visit: Payer: Self-pay

## 2019-09-10 VITALS — BP 136/82 | HR 61 | Temp 98.4°F | Resp 16 | Ht 68.0 in | Wt 219.5 lb

## 2019-09-10 DIAGNOSIS — N1832 Chronic kidney disease, stage 3b: Secondary | ICD-10-CM | POA: Diagnosis not present

## 2019-09-10 DIAGNOSIS — E785 Hyperlipidemia, unspecified: Secondary | ICD-10-CM | POA: Diagnosis not present

## 2019-09-10 DIAGNOSIS — I10 Essential (primary) hypertension: Secondary | ICD-10-CM

## 2019-09-10 DIAGNOSIS — C61 Malignant neoplasm of prostate: Secondary | ICD-10-CM

## 2019-09-10 MED ORDER — EDARBI 40 MG PO TABS: 1.0000 | ORAL_TABLET | Freq: Every day | ORAL | 1 refills | Status: DC

## 2019-09-10 NOTE — Progress Notes (Signed)
Subjective:  Patient ID: Jonathan Medina, male    DOB: 03-12-1930  Age: 86 y.o. MRN: [id number]  CC: Hypertension  This visit occurred during the SARS-CoV-2 public health emergency.  Safety protocols were in place, including screening questions prior to the visit, additional usage of staff PPE, and extensive cleaning of exam room while observing appropriate contact time as indicated for disinfecting solutions.    HPI Jonathan Medina presents for f/up - He complains of fatigue and urinary frequency.  He says that urinary frequency is quite bothersome and he has been followed by urology.  He tells me that none of the urological medications have helped very much.  Outpatient Medications Prior to Visit  Medication Sig Dispense Refill  . carvedilol (COREG CR) 20 MG 24 hr capsule TAKE 1 CAPSULE EVERY MORNING 90 capsule 1  . diazepam (VALIUM) 5 MG tablet Take 1 tablet thirty minutes prior to MRI 1 tablet 0  . Tamsulosin HCl (FLOMAX) 0.4 MG CAPS Take 0.4 mg by mouth daily after breakfast.     . EDARBYCLOR 40-12.5 MG TABS TAKE 1 TABLET DAILY 90 tablet 1  . eszopiclone (LUNESTA) 2 MG TABS tablet Take 1 tablet (2 mg total) by mouth at bedtime as needed for sleep. Take immediately before bedtime (Patient not taking: Reported on 02/27/2019) 90 tablet 1  . NUCYNTA 50 MG tablet As needed for pain     No facility-administered medications prior to visit.    ROS Review of Systems  Constitutional: Positive for fatigue. Negative for appetite change, diaphoresis and unexpected weight change.  HENT: Negative.   Eyes: Negative for visual disturbance.  Respiratory: Negative for cough, chest tightness, shortness of breath and wheezing.   Cardiovascular: Negative for chest pain, palpitations and leg swelling.  Gastrointestinal: Negative for abdominal pain, diarrhea, nausea and vomiting.  Endocrine: Positive for polyuria. Negative for polydipsia and polyphagia.  Genitourinary: Positive for frequency and  urgency. Negative for difficulty urinating, dysuria and hematuria.  Musculoskeletal: Negative.  Negative for arthralgias and myalgias.  Skin: Negative for color change, pallor and rash.  Neurological: Negative.  Negative for dizziness, weakness, light-headedness and numbness.  Hematological: Negative for adenopathy. Does not bruise/bleed easily.  Psychiatric/Behavioral: Negative.     Objective:  BP 136/82 (BP Location: Left Arm, Patient Position: Sitting, Cuff Size: Normal)   Pulse 61   Temp 98.4 F (36.9 C) (Oral)   Resp 16   Ht 5\' 8"  (1.727 m)   Wt 219 lb 8 oz (99.6 kg)   SpO2 99%   BMI 33.37 kg/m   BP Readings from Last 3 Encounters:  09/10/19 136/82  02/27/19 (!) 157/87  09/17/18 (!) 144/74    Wt Readings from Last 3 Encounters:  09/10/19 219 lb 8 oz (99.6 kg)  02/27/19 214 lb (97.1 kg)  09/17/18 217 lb 12 oz (98.8 kg)    Physical Exam Vitals reviewed.  HENT:     Nose: Nose normal.     Mouth/Throat:     Mouth: Mucous membranes are moist.     Pharynx: No oropharyngeal exudate.  Eyes:     General: No scleral icterus.    Conjunctiva/sclera: Conjunctivae normal.  Cardiovascular:     Rate and Rhythm: Normal rate and regular rhythm.     Heart sounds: No murmur.  Pulmonary:     Effort: Pulmonary effort is normal.     Breath sounds: No stridor. No wheezing, rhonchi or rales.  Abdominal:     General: Abdomen is protuberant. Bowel sounds  are normal. There is no distension.     Palpations: Abdomen is soft. There is no hepatomegaly, splenomegaly or mass.     Tenderness: There is no abdominal tenderness.  Musculoskeletal:        General: Normal range of motion.     Cervical back: Normal range of motion.     Right lower leg: No edema.     Left lower leg: No edema.  Lymphadenopathy:     Cervical: No cervical adenopathy.  Skin:    General: Skin is warm and dry.  Neurological:     General: No focal deficit present.     Mental Status: He is alert.  Psychiatric:         Mood and Affect: Mood normal.        Behavior: Behavior normal.     Lab Results  Component Value Date   WBC 6.3 09/10/2019   HGB 12.3 (L) 09/10/2019   HCT 37.0 (L) 09/10/2019   PLT 264.0 09/10/2019   GLUCOSE 85 09/10/2019   CHOL 250 (H) 09/10/2019   TRIG 277.0 (H) 09/10/2019   HDL 39.50 09/10/2019   LDLDIRECT 147.0 09/10/2019   LDLCALC 115 08/30/2018   ALT 14 09/10/2019   AST 15 09/10/2019   NA 134 (L) 09/10/2019   K 4.8 09/10/2019   CL 102 09/10/2019   CREATININE 1.46 09/10/2019   BUN 44 (H) 09/10/2019   CO2 28 09/10/2019   TSH 1.71 09/10/2019   PSA 0.54 07/17/2019   INR 0.9 08/07/2007   HGBA1C 6.0 08/30/2018    MR BRAIN W WO CONTRAST  Result Date: 03/30/2019 CLINICAL DATA:  Third nerve palsy, right. Double vision. History of prostate cancer and lung cancer. Creatinine was obtained on site at [facility] at [address] Results: Creatinine 1.4 mg/dL. EXAM: MRI HEAD WITHOUT AND WITH CONTRAST TECHNIQUE: Multiplanar, multiecho pulse sequences of the brain and surrounding structures were obtained without and with intravenous contrast. CONTRAST:  37mL MULTIHANCE GADOBENATE DIMEGLUMINE 529 MG/ML IV SOLN COMPARISON:  MRI brain 05/23/2008 FINDINGS: Brain: Generalized atrophy with progression. Negative for hydrocephalus. Course of the third cranial nerve shows no mass or abnormal enhancement. Cavernous sinus normal bilaterally. No infarct in the midbrain. Negative for acute infarct. Chronic microvascular ischemic changes in the white matter with progression since 2009. Negative for intracranial hemorrhage. Small area of enhancement along the dura left frontal convexity. This was not present previously and likely represents a meningioma measuring approximately 6 mm. Vascular: Normal arterial flow voids. Skull and upper cervical spine: Negative Sinuses/Orbits: Negative Other: None IMPRESSION: 1. No acute abnormality.  No cause for third nerve palsy 2. Progression of atrophy  and chronic microvascular ischemia since 2009. No acute infarct 3. 5 mm dural enhancing mass left convexity most compatible with small meningioma. Electronically Signed   By: Franchot Gallo M.D.   On: 03/30/2019 11:09    Assessment & Plan:   Webster was seen today for hypertension.  Diagnoses and all orders for this visit:  Essential hypertension- He has developed a prerenal azotemia with a decline in his renal function.  He also complains of urinary frequency.  I have asked him to stop taking the thiazide diuretic but to continue the ARB. -     Discontinue: Azilsartan Medoxomil (EDARBI) 40 MG TABS; Take 1 tablet by mouth daily. -     Azilsartan Medoxomil (EDARBI) 40 MG TABS; Take 1 tablet by mouth daily. -     CBC with Differential/Platelet -  Basic metabolic panel -     TSH  Prostate cancer ([place])- His recent PSA is low which is reassuring sign that he does not have prostate cancer.  Hyperlipidemia with target LDL less than 130- Statin therapy is not indicated at his age. -     Lipid panel -     TSH -     Hepatic function panel  Stage 3b chronic kidney disease- He is prerenal so we will stop the thiazide diuretic.  Will continue the ARB.  I have also asked him to avoid nephrotoxic agents.  Other orders -     LDL cholesterol, direct   I have discontinued Haiden L. Rumpf's eszopiclone, Nucynta, and Edarbyclor. I am also having him maintain his tamsulosin, diazepam, carvedilol, and Edarbi.  Meds ordered this encounter  Medications  . DISCONTD: Azilsartan Medoxomil (EDARBI) 40 MG TABS    Sig: Take 1 tablet by mouth daily.    Dispense:  30 tablet    Refill:  1  . Azilsartan Medoxomil (EDARBI) 40 MG TABS    Sig: Take 1 tablet by mouth daily.    Dispense:  90 tablet    Refill:  1     Follow-up: Return in about 6 months (around 03/12/2020).  [name], MD

## 2019-09-10 NOTE — Patient Instructions (Signed)

## 2019-09-11 ENCOUNTER — Encounter: Payer: Self-pay | Admitting: Internal Medicine

## 2019-09-11 DIAGNOSIS — N1832 Chronic kidney disease, stage 3b: Secondary | ICD-10-CM | POA: Insufficient documentation

## 2019-09-11 LAB — HEPATIC FUNCTION PANEL
ALT: 14 U/L (ref 0–53)
AST: 15 U/L (ref 0–37)
Albumin: 4 g/dL (ref 3.5–5.2)
Alkaline Phosphatase: 63 U/L (ref 39–117)
Bilirubin, Direct: 0.1 mg/dL (ref 0.0–0.3)
Total Bilirubin: 0.4 mg/dL (ref 0.2–1.2)
Total Protein: 7.2 g/dL (ref 6.0–8.3)

## 2019-09-11 LAB — CBC WITH DIFFERENTIAL/PLATELET
Basophils Absolute: 0 10*3/uL (ref 0.0–0.1)
Basophils Relative: 0.5 % (ref 0.0–3.0)
Eosinophils Absolute: 0.1 10*3/uL (ref 0.0–0.7)
Eosinophils Relative: 2.4 % (ref 0.0–5.0)
HCT: 37 % — ABNORMAL LOW (ref 39.0–52.0)
Hemoglobin: 12.3 g/dL — ABNORMAL LOW (ref 13.0–17.0)
Lymphocytes Relative: 30.7 % (ref 12.0–46.0)
Lymphs Abs: 1.9 10*3/uL (ref 0.7–4.0)
MCHC: 33.3 g/dL (ref 30.0–36.0)
MCV: 92.9 fl (ref 78.0–100.0)
Monocytes Absolute: 0.7 10*3/uL (ref 0.1–1.0)
Monocytes Relative: 10.4 % (ref 3.0–12.0)
Neutro Abs: 3.5 10*3/uL (ref 1.4–7.7)
Neutrophils Relative %: 56 % (ref 43.0–77.0)
Platelets: 264 10*3/uL (ref 150.0–400.0)
RBC: 3.98 Mil/uL — ABNORMAL LOW (ref 4.22–5.81)
RDW: 13 % (ref 11.5–15.5)
WBC: 6.3 10*3/uL (ref 4.0–10.5)

## 2019-09-11 LAB — LIPID PANEL
Cholesterol: 250 mg/dL — ABNORMAL HIGH (ref 0–200)
HDL: 39.5 mg/dL (ref >39.00)
NonHDL: 210.49
Total CHOL/HDL Ratio: 6
Triglycerides: 277 mg/dL — ABNORMAL HIGH (ref 0.0–149.0)
VLDL: 55.4 mg/dL — ABNORMAL HIGH (ref 0.0–40.0)

## 2019-09-11 LAB — BASIC METABOLIC PANEL
BUN: 44 mg/dL — ABNORMAL HIGH (ref 6–23)
CO2: 28 mEq/L (ref 19–32)
Calcium: 9.5 mg/dL (ref 8.4–10.5)
Chloride: 102 mEq/L (ref 96–112)
Creatinine, Ser: 1.46 mg/dL (ref 0.40–1.50)
GFR: 55.17 mL/min — ABNORMAL LOW (ref >60.00)
Glucose, Bld: 85 mg/dL (ref 70–99)
Potassium: 4.8 mEq/L (ref 3.5–5.1)
Sodium: 134 mEq/L — ABNORMAL LOW (ref 135–145)

## 2019-09-11 LAB — TSH: TSH: 1.71 u[IU]/mL (ref 0.35–4.50)

## 2019-09-11 LAB — LDL CHOLESTEROL, DIRECT: Direct LDL: 147 mg/dL

## 2019-09-12 ENCOUNTER — Telehealth: Payer: Self-pay | Admitting: Internal Medicine

## 2019-09-12 NOTE — Telephone Encounter (Signed)
New Message:   Pt is calling and states he would like have a copy of his last labs he had done. Please mail to the patient.

## 2019-09-12 NOTE — Telephone Encounter (Signed)
F/u   Dr. [name] sent him an email today.   The patient call stating he would like to try the new medication that recommendation by Dr. [name], patient verbalized no statin medication.    Local pharmacies charge full price for medication please send the new prescription to [facility].

## 2019-09-14 ENCOUNTER — Other Ambulatory Visit: Payer: Self-pay | Admitting: Internal Medicine

## 2019-09-14 DIAGNOSIS — I10 Essential (primary) hypertension: Secondary | ICD-10-CM

## 2019-09-24 ENCOUNTER — Telehealth: Payer: Self-pay | Admitting: Internal Medicine

## 2019-09-24 ENCOUNTER — Other Ambulatory Visit: Payer: Self-pay | Admitting: Internal Medicine

## 2019-09-24 DIAGNOSIS — I10 Essential (primary) hypertension: Secondary | ICD-10-CM

## 2019-09-24 MED ORDER — AMLODIPINE BESYLATE 5 MG PO TABS: 5.0000 mg | ORAL_TABLET | Freq: Every day | ORAL | 1 refills | Status: DC

## 2019-09-24 NOTE — Telephone Encounter (Signed)
Pt is rq to go back to amlodipine. Pt stated that the Jonathan Medina is making his face and tongue feel like it is swelling. He is having not other symptoms.   Please advise.

## 2019-09-24 NOTE — Telephone Encounter (Signed)
New Message:    Pt c/o medication issue:  1. Name of Medication:  Azilsartan Medoxomil (EDARBI) 40 MG TABS  2. What is your medication issue? Pt states this medication makes his tongue and face swell. He states he will not be taking anymore of this medication. He states can he go back on the Norvasc 10 mg and if so to please send the prescription to [facility]. Please advise.

## 2019-09-25 NOTE — Telephone Encounter (Signed)
Pt informed rx has been sent.  

## 2019-09-26 NOTE — Telephone Encounter (Addendum)
   [facility] calling to verify allergies. Also wanting to confirm amLODipine (NORVASC) 5 MG tablet is appropriate for taking due to several allergies to meds   Please call [facility]

## 2019-09-26 NOTE — Telephone Encounter (Signed)
The number listed is a non working number. Not able to complete call.

## 2019-09-27 NOTE — Telephone Encounter (Signed)
   Please return call to [facility] at [phone] Ref # [id number]

## 2019-09-27 NOTE — Telephone Encounter (Signed)
[facility] contacted and informed that amlodipine was a previous therapy with no issues.

## 2019-10-14 ENCOUNTER — Encounter: Payer: Self-pay | Admitting: Adult Health

## 2019-10-14 ENCOUNTER — Other Ambulatory Visit: Payer: Self-pay

## 2019-10-14 ENCOUNTER — Ambulatory Visit (INDEPENDENT_AMBULATORY_CARE_PROVIDER_SITE_OTHER): Payer: Medicare Other | Admitting: Adult Health

## 2019-10-14 DIAGNOSIS — G4733 Obstructive sleep apnea (adult) (pediatric): Secondary | ICD-10-CM | POA: Diagnosis not present

## 2019-10-14 DIAGNOSIS — Z9989 Dependence on other enabling machines and devices: Secondary | ICD-10-CM | POA: Diagnosis not present
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
## 2019-10-14 NOTE — Assessment & Plan Note (Signed)
Excellent control and compliance on nocturnal CPAP no changes  Plan  Patient Instructions  Continue on CPAP At bedtime   Keep up good work .  Do not drive if sleepy.  Work on healthy weight.  Follow up Dr. Elsworth Soho  In 1 year  And As needed

## 2019-10-14 NOTE — Assessment & Plan Note (Signed)
Encouraged on healthy weight loss

## 2019-10-14 NOTE — Patient Instructions (Signed)
Continue on CPAP At bedtime   Keep up good work .  Do not drive if sleepy.  Work on healthy weight.  Follow up Dr. Elsworth Soho  In 1 year  And As needed

## 2019-10-21 ENCOUNTER — Encounter: Payer: Self-pay | Admitting: Internal Medicine

## 2019-10-21 ENCOUNTER — Other Ambulatory Visit: Payer: Self-pay

## 2019-10-21 ENCOUNTER — Ambulatory Visit (INDEPENDENT_AMBULATORY_CARE_PROVIDER_SITE_OTHER): Payer: Medicare Other | Admitting: Internal Medicine

## 2019-10-21 VITALS — BP 154/64 | HR 61 | Temp 98.1°F | Resp 16 | Ht 68.0 in | Wt 219.0 lb

## 2019-10-21 DIAGNOSIS — D539 Nutritional anemia, unspecified: Secondary | ICD-10-CM | POA: Diagnosis not present

## 2019-10-21 DIAGNOSIS — I10 Essential (primary) hypertension: Secondary | ICD-10-CM

## 2019-10-21 DIAGNOSIS — E781 Pure hyperglyceridemia: Secondary | ICD-10-CM

## 2019-10-21 LAB — CBC WITH DIFFERENTIAL/PLATELET
Basophils Absolute: 0 10*3/uL (ref 0.0–0.1)
Basophils Relative: 0.4 % (ref 0.0–3.0)
Eosinophils Absolute: 0.1 10*3/uL (ref 0.0–0.7)
Eosinophils Relative: 1.7 % (ref 0.0–5.0)
HCT: 36.7 % — ABNORMAL LOW (ref 39.0–52.0)
Hemoglobin: 12.3 g/dL — ABNORMAL LOW (ref 13.0–17.0)
Lymphocytes Relative: 29.9 % (ref 12.0–46.0)
Lymphs Abs: 1.6 10*3/uL (ref 0.7–4.0)
MCHC: 33.5 g/dL (ref 30.0–36.0)
MCV: 92.6 fl (ref 78.0–100.0)
Monocytes Absolute: 0.5 10*3/uL (ref 0.1–1.0)
Monocytes Relative: 10.1 % (ref 3.0–12.0)
Neutro Abs: 3.1 10*3/uL (ref 1.4–7.7)
Neutrophils Relative %: 57.9 % (ref 43.0–77.0)
Platelets: 266 10*3/uL (ref 150.0–400.0)
RBC: 3.97 Mil/uL — ABNORMAL LOW (ref 4.22–5.81)
RDW: 13.2 % (ref 11.5–15.5)
WBC: 5.3 10*3/uL (ref 4.0–10.5)

## 2019-10-21 LAB — TRIGLYCERIDES: Triglycerides: 192 mg/dL — ABNORMAL HIGH (ref 0.0–149.0)

## 2019-10-21 LAB — FERRITIN: Ferritin: 39.8 ng/mL (ref 22.0–322.0)

## 2019-10-21 LAB — IBC PANEL
Iron: 61 ug/dL (ref 42–165)
Saturation Ratios: 16.1 % — ABNORMAL LOW (ref 20.0–50.0)
Transferrin: 271 mg/dL (ref 212.0–360.0)

## 2019-10-21 LAB — VITAMIN B12: Vitamin B-12: 542 pg/mL (ref 211–911)

## 2019-10-21 LAB — FOLATE: Folate: 22.3 ng/mL (ref >5.9)

## 2019-10-21 NOTE — Progress Notes (Signed)
Subjective:  Patient ID: Jonathan Medina, male    DOB: 08/12/1929  Age: 86 y.o. MRN: [id number]  CC: Hypertension, Anemia, and Hyperlipidemia  This visit occurred during the SARS-CoV-2 public health emergency.  Safety protocols were in place, including screening questions prior to the visit, additional usage of staff PPE, and extensive cleaning of exam room while observing appropriate contact time as indicated for disinfecting solutions.    HPI Jonathan Medina presents for f/up - He is concerned about his anemia and hypertriglyceridemia.  His wife gets frustrated with him because she thinks he lives off of Ensure, milk, and ice cream.  He denies any recent episodes of abdominal pain, sources of blood loss, paresthesias, shortness of breath, weakness, or fatigue.  Outpatient Medications Prior to Visit  Medication Sig Dispense Refill  . amLODipine (NORVASC) 5 MG tablet Take 1 tablet (5 mg total) by mouth daily. 90 tablet 1  . carvedilol (COREG CR) 20 MG 24 hr capsule TAKE 1 CAPSULE EVERY MORNING 90 capsule 1  . Tamsulosin HCl (FLOMAX) 0.4 MG CAPS Take 0.4 mg by mouth daily after breakfast.      No facility-administered medications prior to visit.    ROS Review of Systems  Constitutional: Negative for appetite change, diaphoresis, fatigue and unexpected weight change.  HENT: Negative.   Eyes: Negative for visual disturbance.  Respiratory: Negative for cough, chest tightness, shortness of breath and wheezing.   Cardiovascular: Negative for palpitations and leg swelling.  Gastrointestinal: Negative for abdominal pain, constipation, diarrhea, nausea and vomiting.  Genitourinary: Negative.  Negative for difficulty urinating.  Musculoskeletal: Negative.   Skin: Negative.  Negative for color change and pallor.  Neurological: Negative.   Hematological: Negative for adenopathy. Does not bruise/bleed easily.  Psychiatric/Behavioral: Negative.     Objective:  BP (!) 154/64 (BP Location: Left  Arm, Patient Position: Sitting, Cuff Size: Large)   Pulse 61   Temp 98.1 F (36.7 C) (Oral)   Resp 16   Ht 5\' 8"  (1.727 m)   Wt 219 lb (99.3 kg)   SpO2 96%   BMI 33.30 kg/m   BP Readings from Last 3 Encounters:  10/21/19 (!) 154/64  10/14/19 132/62  09/10/19 136/82    Wt Readings from Last 3 Encounters:  10/21/19 219 lb (99.3 kg)  10/14/19 218 lb 3.2 oz (99 kg)  09/10/19 219 lb 8 oz (99.6 kg)    Physical Exam Constitutional:      Appearance: He is obese.  HENT:     Nose: Nose normal.     Mouth/Throat:     Mouth: Mucous membranes are moist.  Eyes:     General: No scleral icterus.    Conjunctiva/sclera: Conjunctivae normal.  Cardiovascular:     Rate and Rhythm: Normal rate and regular rhythm.     Heart sounds: No murmur.  Pulmonary:     Effort: Pulmonary effort is normal.     Breath sounds: No stridor. No wheezing, rhonchi or rales.  Abdominal:     General: Abdomen is protuberant. Bowel sounds are normal. There is no distension.     Palpations: Abdomen is soft. There is no hepatomegaly, splenomegaly or mass.     Tenderness: There is no abdominal tenderness.  Musculoskeletal:        General: Normal range of motion.     Cervical back: Neck supple.     Right lower leg: No edema.     Left lower leg: No edema.  Lymphadenopathy:     Cervical:  No cervical adenopathy.  Skin:    General: Skin is warm and dry.     Coloration: Skin is not pale.  Neurological:     General: No focal deficit present.     Mental Status: He is alert.  Psychiatric:        Mood and Affect: Mood normal.        Behavior: Behavior normal.     Lab Results  Component Value Date   WBC 5.3 10/21/2019   HGB 12.3 (L) 10/21/2019   HCT 36.7 (L) 10/21/2019   PLT 266.0 10/21/2019   GLUCOSE 85 09/10/2019   CHOL 250 (H) 09/10/2019   TRIG 192.0 (H) 10/21/2019   HDL 39.50 09/10/2019   LDLDIRECT 147.0 09/10/2019   LDLCALC 115 08/30/2018   ALT 14 09/10/2019   AST 15 09/10/2019   NA 134 (L)  09/10/2019   K 4.8 09/10/2019   CL 102 09/10/2019   CREATININE 1.46 09/10/2019   BUN 44 (H) 09/10/2019   CO2 28 09/10/2019   TSH 1.71 09/10/2019   PSA 0.54 07/17/2019   INR 0.9 08/07/2007   HGBA1C 6.0 08/30/2018    MR BRAIN W WO CONTRAST  Result Date: 03/30/2019 CLINICAL DATA:  Third nerve palsy, right. Double vision. History of prostate cancer and lung cancer. Creatinine was obtained on site at [facility] at [address] Results: Creatinine 1.4 mg/dL. EXAM: MRI HEAD WITHOUT AND WITH CONTRAST TECHNIQUE: Multiplanar, multiecho pulse sequences of the brain and surrounding structures were obtained without and with intravenous contrast. CONTRAST:  64mL MULTIHANCE GADOBENATE DIMEGLUMINE 529 MG/ML IV SOLN COMPARISON:  MRI brain 05/23/2008 FINDINGS: Brain: Generalized atrophy with progression. Negative for hydrocephalus. Course of the third cranial nerve shows no mass or abnormal enhancement. Cavernous sinus normal bilaterally. No infarct in the midbrain. Negative for acute infarct. Chronic microvascular ischemic changes in the white matter with progression since 2009. Negative for intracranial hemorrhage. Small area of enhancement along the dura left frontal convexity. This was not present previously and likely represents a meningioma measuring approximately 6 mm. Vascular: Normal arterial flow voids. Skull and upper cervical spine: Negative Sinuses/Orbits: Negative Other: None IMPRESSION: 1. No acute abnormality.  No cause for third nerve palsy 2. Progression of atrophy and chronic microvascular ischemia since 2009. No acute infarct 3. 5 mm dural enhancing mass left convexity most compatible with small meningioma. Electronically Signed   By: Franchot Gallo M.D.   On: 03/30/2019 11:09    Assessment & Plan:   Jonathan Medina was seen today for hypertension, anemia and hyperlipidemia.  Diagnoses and all orders for this visit:  Deficiency anemia- His H&H are unchanged.  His vitamin levels are  normal.  This is consistent with the anemia of advanced age.  There are no treatment options for this. -     IBC panel; Future -     Reticulocytes; Future -     Folate; Future -     Ferritin; Future -     CBC with Differential/Platelet; Future -     Vitamin B12; Future -     Vitamin B1; Future -     Vitamin B1 -     Vitamin B12 -     CBC with Differential/Platelet -     Ferritin -     IBC panel -     Folate -     Reticulocytes  Essential hypertension- His blood pressure is adequately well controlled.  High triglycerides- His triglycerides are mildly elevated but do not require  medical therapy he was encouraged to improve his lifestyle modifications. -     Triglycerides; Future -     Triglycerides   I am having Jonathan Medina maintain his tamsulosin, carvedilol, and amLODipine.  No orders of the defined types were placed in this encounter.    Follow-up: Return in about 6 months (around 04/21/2020).  [name], MD

## 2019-10-21 NOTE — Patient Instructions (Signed)
Anemia  Anemia is a condition in which you do not have enough red blood cells or hemoglobin. Hemoglobin is a substance in red blood cells that carries oxygen. When you do not have enough red blood cells or hemoglobin (are anemic), your body cannot get enough oxygen and your organs may not work properly. As a result, you may feel very tired or have other problems. What are the causes? Common causes of anemia include:  Excessive bleeding. Anemia can be caused by excessive bleeding inside or outside the body, including bleeding from the intestine or from periods in women.  Poor nutrition.  Long-lasting (chronic) kidney, thyroid, and liver disease.  Bone marrow disorders.  Cancer and treatments for cancer.  HIV (human immunodeficiency virus) and AIDS (acquired immunodeficiency syndrome).  Treatments for HIV and AIDS.  Spleen problems.  Blood disorders.  Infections, medicines, and autoimmune disorders that destroy red blood cells. What are the signs or symptoms? Symptoms of this condition include:  Minor weakness.  Dizziness.  Headache.  Feeling heartbeats that are irregular or faster than normal (palpitations).  Shortness of breath, especially with exercise.  Paleness.  Cold sensitivity.  Indigestion.  Nausea.  Difficulty sleeping.  Difficulty concentrating. Symptoms may occur suddenly or develop slowly. If your anemia is mild, you may not have symptoms. How is this diagnosed? This condition is diagnosed based on:  Blood tests.  Your medical history.  A physical exam.  Bone marrow biopsy. Your health care provider may also check your stool (feces) for blood and may do additional testing to look for the cause of your bleeding. You may also have other tests, including:  Imaging tests, such as a CT scan or MRI.  Endoscopy.  Colonoscopy. How is this treated? Treatment for this condition depends on the cause. If you continue to lose a lot of blood, you may  need to be treated at a hospital. Treatment may include:  Taking supplements of iron, vitamin S31, or folic acid.  Taking a hormone medicine (erythropoietin) that can help to stimulate red blood cell growth.  Having a blood transfusion. This may be needed if you lose a lot of blood.  Making changes to your diet.  Having surgery to remove your spleen. Follow these instructions at home:  Take over-the-counter and prescription medicines only as told by your health care provider.  Take supplements only as told by your health care provider.  Follow any diet instructions that you were given.  Keep all follow-up visits as told by your health care provider. This is important. Contact a health care provider if:  You develop new bleeding anywhere in the body. Get help right away if:  You are very weak.  You are short of breath.  You have pain in your abdomen or chest.  You are dizzy or feel faint.  You have trouble concentrating.  You have bloody or black, tarry stools.  You vomit repeatedly or you vomit up blood. Summary  Anemia is a condition in which you do not have enough red blood cells or enough of a substance in your red blood cells that carries oxygen (hemoglobin).  Symptoms may occur suddenly or develop slowly.  If your anemia is mild, you may not have symptoms.  This condition is diagnosed with blood tests as well as a medical history and physical exam. Other tests may be needed.  Treatment for this condition depends on the cause of the anemia. This information is not intended to replace advice given to you by  your health care provider. Make sure you discuss any questions you have with your health care provider. Document Revised: 06/02/2017 Document Reviewed: 07/22/2016 Elsevier Patient Education  [employer].

## 2019-10-25 ENCOUNTER — Encounter: Payer: Self-pay | Admitting: Internal Medicine

## 2019-10-25 LAB — VITAMIN B1: Vitamin B1 (Thiamine): 20 nmol/L (ref 8–30)

## 2019-10-25 LAB — RETICULOCYTES
ABS Retic: 66560 cells/uL (ref 25000–9000)
Retic Ct Pct: 1.6 %

## 2019-11-29 DIAGNOSIS — M7989 Other specified soft tissue disorders: Secondary | ICD-10-CM | POA: Diagnosis not present

## 2019-11-29 DIAGNOSIS — R2231 Localized swelling, mass and lump, right upper limb: Secondary | ICD-10-CM | POA: Diagnosis not present

## 2019-11-29 DIAGNOSIS — M25841 Other specified joint disorders, right hand: Secondary | ICD-10-CM | POA: Diagnosis not present

## 2019-12-05 DIAGNOSIS — M7989 Other specified soft tissue disorders: Secondary | ICD-10-CM | POA: Diagnosis not present

## 2019-12-09 ENCOUNTER — Other Ambulatory Visit: Payer: Self-pay | Admitting: Internal Medicine

## 2019-12-09 DIAGNOSIS — M7989 Other specified soft tissue disorders: Secondary | ICD-10-CM | POA: Diagnosis not present

## 2019-12-09 DIAGNOSIS — I10 Essential (primary) hypertension: Secondary | ICD-10-CM

## 2019-12-16 DIAGNOSIS — M7989 Other specified soft tissue disorders: Secondary | ICD-10-CM | POA: Diagnosis not present

## 2020-01-20 DIAGNOSIS — M7989 Other specified soft tissue disorders: Secondary | ICD-10-CM | POA: Diagnosis not present

## 2020-02-25 DIAGNOSIS — Z23 Encounter for immunization: Secondary | ICD-10-CM | POA: Diagnosis not present

## 2020-03-16 ENCOUNTER — Other Ambulatory Visit: Payer: Self-pay | Admitting: Internal Medicine

## 2020-03-16 DIAGNOSIS — I10 Essential (primary) hypertension: Secondary | ICD-10-CM

## 2020-03-31 DIAGNOSIS — Z23 Encounter for immunization: Secondary | ICD-10-CM | POA: Diagnosis not present

## 2020-06-08 ENCOUNTER — Telehealth: Payer: Self-pay | Admitting: Internal Medicine

## 2020-06-08 DIAGNOSIS — I10 Essential (primary) hypertension: Secondary | ICD-10-CM

## 2020-06-10 MED ORDER — CARVEDILOL PHOSPHATE ER 20 MG PO CP24: 20.0000 mg | ORAL_CAPSULE | Freq: Every morning | ORAL | 0 refills | Status: DC

## 2020-06-10 NOTE — Telephone Encounter (Signed)
   Patient requesting short supply of Coreg be called to [facility], [place]

## 2020-06-10 NOTE — Addendum Note (Signed)
Addended by: Hinda Kehr on: 06/10/2020 11:06 AM   Modules accepted: Orders

## 2020-06-16 ENCOUNTER — Ambulatory Visit (INDEPENDENT_AMBULATORY_CARE_PROVIDER_SITE_OTHER): Payer: Medicare Other | Admitting: Internal Medicine

## 2020-06-16 ENCOUNTER — Other Ambulatory Visit: Payer: Self-pay

## 2020-06-16 ENCOUNTER — Encounter: Payer: Self-pay | Admitting: Internal Medicine

## 2020-06-16 VITALS — BP 140/70 | HR 63 | Temp 97.8°F | Resp 16 | Ht 68.0 in | Wt 204.0 lb

## 2020-06-16 DIAGNOSIS — N1832 Chronic kidney disease, stage 3b: Secondary | ICD-10-CM

## 2020-06-16 DIAGNOSIS — I10 Essential (primary) hypertension: Secondary | ICD-10-CM

## 2020-06-16 DIAGNOSIS — R7303 Prediabetes: Secondary | ICD-10-CM | POA: Diagnosis not present

## 2020-06-16 LAB — BASIC METABOLIC PANEL
BUN: 26 mg/dL — ABNORMAL HIGH (ref 6–23)
CO2: 30 mEq/L (ref 19–32)
Calcium: 9.1 mg/dL (ref 8.4–10.5)
Chloride: 102 mEq/L (ref 96–112)
Creatinine, Ser: 1.14 mg/dL (ref 0.40–1.50)
GFR: 57.41 mL/min — ABNORMAL LOW (ref >60.00)
Glucose, Bld: 71 mg/dL (ref 70–99)
Potassium: 4.4 mEq/L (ref 3.5–5.1)
Sodium: 138 mEq/L (ref 135–145)

## 2020-06-16 LAB — CBC WITH DIFFERENTIAL/PLATELET
Basophils Absolute: 0 10*3/uL (ref 0.0–0.1)
Basophils Relative: 0.5 % (ref 0.0–3.0)
Eosinophils Absolute: 0.1 10*3/uL (ref 0.0–0.7)
Eosinophils Relative: 1.6 % (ref 0.0–5.0)
HCT: 37.4 % — ABNORMAL LOW (ref 39.0–52.0)
Hemoglobin: 12.7 g/dL — ABNORMAL LOW (ref 13.0–17.0)
Lymphocytes Relative: 34.1 % (ref 12.0–46.0)
Lymphs Abs: 1.9 10*3/uL (ref 0.7–4.0)
MCHC: 33.9 g/dL (ref 30.0–36.0)
MCV: 90 fl (ref 78.0–100.0)
Monocytes Absolute: 0.6 10*3/uL (ref 0.1–1.0)
Monocytes Relative: 10.1 % (ref 3.0–12.0)
Neutro Abs: 3.1 10*3/uL (ref 1.4–7.7)
Neutrophils Relative %: 53.7 % (ref 43.0–77.0)
Platelets: 254 10*3/uL (ref 150.0–400.0)
RBC: 4.15 Mil/uL — ABNORMAL LOW (ref 4.22–5.81)
RDW: 13.7 % (ref 11.5–15.5)
WBC: 5.7 10*3/uL (ref 4.0–10.5)

## 2020-06-16 LAB — HEMOGLOBIN A1C: Hgb A1c MFr Bld: 5.9 % (ref 4.6–6.5)

## 2020-06-16 NOTE — Progress Notes (Signed)
Subjective:  Patient ID: Jonathan Medina, male    DOB: [birth year]  Age: 86 y.o. MRN: [id number]  CC: Hypertension and Diabetes  This visit occurred during the SARS-CoV-2 public health emergency.  Safety protocols were in place, including screening questions prior to the visit, additional usage of staff PPE, and extensive cleaning of exam room while observing appropriate contact time as indicated for disinfecting solutions.    HPI Jonathan Medina presents for f/up - He has intentionally lost weight with lifestyle modifications.  He is active and denies any recent episodes of chest pain, shortness of breath, dyspnea on exertion, palpitations, edema, fatigue, or polys.  Outpatient Medications Prior to Visit  Medication Sig Dispense Refill  . amLODipine (NORVASC) 5 MG tablet TAKE 1 TABLET DAILY 90 tablet 1  . carvedilol (COREG CR) 20 MG 24 hr capsule Take 1 capsule (20 mg total) by mouth every morning. 30 capsule 0  . Tamsulosin HCl (FLOMAX) 0.4 MG CAPS Take 0.4 mg by mouth daily after breakfast.      No facility-administered medications prior to visit.    ROS Review of Systems  Constitutional: Negative.  Negative for chills, diaphoresis, fatigue and unexpected weight change.  HENT: Negative.   Respiratory: Negative for cough, chest tightness, shortness of breath and wheezing.   Cardiovascular: Negative for chest pain, palpitations and leg swelling.  Gastrointestinal: Negative for abdominal pain, constipation, diarrhea, nausea and vomiting.  Endocrine: Negative.   Genitourinary: Negative.  Negative for difficulty urinating.  Musculoskeletal: Negative for arthralgias and myalgias.  Skin: Negative.  Negative for color change.  Neurological: Negative.  Negative for dizziness, weakness, light-headedness and headaches.  Hematological: Negative for adenopathy. Does not bruise/bleed easily.  Psychiatric/Behavioral: Negative.     Objective:  BP 140/70   Pulse 63   Temp 97.8 F (36.6 C)  (Oral)   Resp 16   Ht 5\' 8"  (1.727 m)   Wt 204 lb (92.5 kg)   SpO2 96%   BMI 31.02 kg/m   BP Readings from Last 3 Encounters:  06/16/20 140/70  10/21/19 (!) 154/64  10/14/19 132/62    Wt Readings from Last 3 Encounters:  06/16/20 204 lb (92.5 kg)  10/21/19 219 lb (99.3 kg)  10/14/19 218 lb 3.2 oz (99 kg)    Physical Exam Vitals reviewed.  HENT:     Nose: Nose normal.     Mouth/Throat:     Mouth: Mucous membranes are moist.  Eyes:     Conjunctiva/sclera: Conjunctivae normal.  Cardiovascular:     Rate and Rhythm: Normal rate and regular rhythm.     Heart sounds: No murmur heard.   Pulmonary:     Effort: Pulmonary effort is normal.     Breath sounds: No stridor. No wheezing, rhonchi or rales.  Abdominal:     General: Abdomen is protuberant. There is no distension.     Palpations: Abdomen is soft. There is no hepatomegaly, splenomegaly or mass.  Musculoskeletal:        General: Normal range of motion.     Cervical back: Normal range of motion and neck supple.     Right lower leg: No edema.     Left lower leg: No edema.  Skin:    General: Skin is warm and dry.  Neurological:     General: No focal deficit present.     Mental Status: He is alert.  Psychiatric:        Mood and Affect: Mood normal.  Behavior: Behavior normal.     Lab Results  Component Value Date   WBC 5.7 06/16/2020   HGB 12.7 (L) 06/16/2020   HCT 37.4 (L) 06/16/2020   PLT 254.0 06/16/2020   GLUCOSE 71 06/16/2020   CHOL 250 (H) 09/10/2019   TRIG 192.0 (H) 10/21/2019   HDL 39.50 09/10/2019   LDLDIRECT 147.0 09/10/2019   LDLCALC 115 08/30/2018   ALT 14 09/10/2019   AST 15 09/10/2019   NA 138 06/16/2020   K 4.4 06/16/2020   CL 102 06/16/2020   CREATININE 1.14 06/16/2020   BUN 26 (H) 06/16/2020   CO2 30 06/16/2020   TSH 1.71 09/10/2019   PSA 0.54 07/17/2019   INR 0.9 08/07/2007   HGBA1C 5.9 06/16/2020    MR BRAIN W WO CONTRAST  Result Date: 03/30/2019 CLINICAL DATA:  Third  nerve palsy, right. Double vision. History of prostate cancer and lung cancer. Creatinine was obtained on site at [facility] at [address] Results: Creatinine 1.4 mg/dL. EXAM: MRI HEAD WITHOUT AND WITH CONTRAST TECHNIQUE: Multiplanar, multiecho pulse sequences of the brain and surrounding structures were obtained without and with intravenous contrast. CONTRAST:  68mL MULTIHANCE GADOBENATE DIMEGLUMINE 529 MG/ML IV SOLN COMPARISON:  MRI brain 05/23/2008 FINDINGS: Brain: Generalized atrophy with progression. Negative for hydrocephalus. Course of the third cranial nerve shows no mass or abnormal enhancement. Cavernous sinus normal bilaterally. No infarct in the midbrain. Negative for acute infarct. Chronic microvascular ischemic changes in the white matter with progression since 2009. Negative for intracranial hemorrhage. Small area of enhancement along the dura left frontal convexity. This was not present previously and likely represents a meningioma measuring approximately 6 mm. Vascular: Normal arterial flow voids. Skull and upper cervical spine: Negative Sinuses/Orbits: Negative Other: None IMPRESSION: 1. No acute abnormality.  No cause for third nerve palsy 2. Progression of atrophy and chronic microvascular ischemia since 2009. No acute infarct 3. 5 mm dural enhancing mass left convexity most compatible with small meningioma. Electronically Signed   By: Franchot Gallo M.D.   On: 03/30/2019 11:09    Assessment & Plan:   Jonathan Medina was seen today for hypertension and diabetes.  Diagnoses and all orders for this visit:  Essential hypertension- His blood pressure is adequately well controlled. -     Basic metabolic panel; Future -     Basic metabolic panel  Stage 3b chronic kidney disease ([place])- His renal function is stable.  His blood pressure and blood sugar are adequately well controlled.  He continues to avoid nephrotoxic agents. -     CBC with Differential/Platelet; Future -     Basic  metabolic panel; Future -     Basic metabolic panel -     CBC with Differential/Platelet  Prediabetes- His A1c is down to 5.9%.  He was praised for improving on his lifestyle modifications. -     Basic metabolic panel; Future -     Hemoglobin A1c; Future -     Hemoglobin A1c -     Basic metabolic panel   I am having Jonathan Medina maintain his tamsulosin, amLODipine, and carvedilol.  No orders of the defined types were placed in this encounter.    Follow-up: Return in about 6 months (around 12/15/2020).  [name], MD

## 2020-06-16 NOTE — Patient Instructions (Signed)

## 2020-06-22 ENCOUNTER — Other Ambulatory Visit: Payer: Self-pay | Admitting: Internal Medicine

## 2020-06-22 DIAGNOSIS — I10 Essential (primary) hypertension: Secondary | ICD-10-CM

## 2020-06-22 MED ORDER — CARVEDILOL PHOSPHATE ER 20 MG PO CP24: 20.0000 mg | ORAL_CAPSULE | Freq: Every morning | ORAL | 1 refills | Status: DC

## 2020-07-10 ENCOUNTER — Telehealth: Payer: Self-pay | Admitting: Internal Medicine

## 2020-07-10 NOTE — Telephone Encounter (Signed)
Results printed and mailed.   

## 2020-07-10 NOTE — Telephone Encounter (Signed)
Jonathan Medina would like lab results from their last visit mailed to their home address for him and his wife, Narda Rutherford ([id number])  [place]  [place] [address]

## 2020-07-15 DIAGNOSIS — Z8546 Personal history of malignant neoplasm of prostate: Secondary | ICD-10-CM | POA: Diagnosis not present

## 2020-07-22 DIAGNOSIS — N3011 Interstitial cystitis (chronic) with hematuria: Secondary | ICD-10-CM | POA: Diagnosis not present

## 2020-07-22 DIAGNOSIS — R351 Nocturia: Secondary | ICD-10-CM | POA: Diagnosis not present

## 2020-07-22 DIAGNOSIS — R3915 Urgency of urination: Secondary | ICD-10-CM | POA: Diagnosis not present

## 2020-07-22 DIAGNOSIS — R3982 Chronic bladder pain: Secondary | ICD-10-CM | POA: Diagnosis not present

## 2020-07-22 DIAGNOSIS — Z8546 Personal history of malignant neoplasm of prostate: Secondary | ICD-10-CM | POA: Diagnosis not present

## 2020-07-22 DIAGNOSIS — E349 Endocrine disorder, unspecified: Secondary | ICD-10-CM | POA: Diagnosis not present

## 2020-08-27 DIAGNOSIS — N3011 Interstitial cystitis (chronic) with hematuria: Secondary | ICD-10-CM | POA: Diagnosis not present

## 2020-08-27 DIAGNOSIS — Z8546 Personal history of malignant neoplasm of prostate: Secondary | ICD-10-CM | POA: Diagnosis not present

## 2020-09-16 ENCOUNTER — Encounter: Payer: Self-pay | Admitting: Urology

## 2020-09-16 DIAGNOSIS — R3982 Chronic bladder pain: Secondary | ICD-10-CM | POA: Diagnosis not present

## 2020-09-16 DIAGNOSIS — N3011 Interstitial cystitis (chronic) with hematuria: Secondary | ICD-10-CM | POA: Diagnosis not present

## 2020-09-16 DIAGNOSIS — R310 Gross hematuria: Secondary | ICD-10-CM | POA: Diagnosis not present

## 2020-09-16 DIAGNOSIS — E349 Endocrine disorder, unspecified: Secondary | ICD-10-CM | POA: Diagnosis not present

## 2020-09-16 DIAGNOSIS — R351 Nocturia: Secondary | ICD-10-CM | POA: Diagnosis not present

## 2020-09-18 ENCOUNTER — Other Ambulatory Visit: Payer: Self-pay | Admitting: Internal Medicine

## 2020-09-18 DIAGNOSIS — I10 Essential (primary) hypertension: Secondary | ICD-10-CM

## 2020-09-21 DIAGNOSIS — D414 Neoplasm of uncertain behavior of bladder: Secondary | ICD-10-CM | POA: Diagnosis not present

## 2020-09-21 DIAGNOSIS — N3011 Interstitial cystitis (chronic) with hematuria: Secondary | ICD-10-CM | POA: Diagnosis not present

## 2020-09-24 ENCOUNTER — Other Ambulatory Visit: Payer: Self-pay | Admitting: Urology

## 2020-09-29 NOTE — Progress Notes (Addendum)
COVID Vaccine Completed: x3 Date COVID Vaccine completed:  07-24-19 08-14-19 Has received booster:  02-25-20 COVID vaccine manufacturer: Pfizer     Date of COVID positive in last 44 days:N/A  PCP - [name], MD Cardiologist - N/A  Chest x-ray - N/A EKG - 10-02-20 Epic Stress Test -  ECHO -  Cardiac Cath -  Pacemaker/ICD device last checked: Spinal Cord Stimulator:  Sleep Study - 1 year ago, + sleep apnea CPAP - Yes  Fasting Blood Sugar - N/A Checks Blood Sugar _____ times a day  Blood Thinner Instructions: N/A Aspirin Instructions: Last Dose:  Activity level:  Can go up a flight of stairs and perform activities of daily living without stopping and without symptoms of chest pain or shortness of breath.   Anesthesia review:  COPD, OSA  Patient denies shortness of breath, fever, cough and chest pain at PAT appointment   Patient verbalized understanding of instructions that were given to them at the PAT appointment. Patient was also instructed that they will need to review over the PAT instructions again at home before surgery.

## 2020-09-29 NOTE — Patient Instructions (Addendum)
DUE TO COVID-19 ONLY ONE VISITOR IS ALLOWED TO COME WITH YOU AND STAY IN THE WAITING ROOM ONLY DURING PRE OP AND PROCEDURE.     COVID SWAB TESTING MUST BE COMPLETED ON:  Friday, 10-02-20 @ 12:45 PM   [address] [place], [place] [address]  (Must self quarantine after testing. Follow instructions on handout.)   Your procedure is scheduled on:  Tuesday, 10-06-20   Report to [facility] Main  Entrance    Report to admitting at 7:00 AM   Call this number if you have problems the morning of surgery [phone]   Do not eat food :After Midnight.   May have liquids until 6:00 AM day of surgery  CLEAR LIQUID DIET  Foods Allowed                                                                     Foods Excluded  Water, Black Coffee and tea, regular and decaf              liquids that you cannot  Plain Jell-O in any flavor  (No red)                                     see through such as: Fruit ices (not with fruit pulp)                                      milk, soups, orange juice              Iced Popsicles (No red)                                      All solid food                                   Apple juices Sports drinks like Gatorade (No red) Lightly seasoned clear broth or consume(fat free) Sugar, honey syrup    Oral Hygiene is also important to reduce your risk of infection.                                    Remember - BRUSH YOUR TEETH THE MORNING OF SURGERY WITH YOUR REGULAR TOOTHPASTE   Do NOT smoke after Midnight   Take these medicines the morning of surgery with A SIP OF WATER:  Amlodipine, Carvedilol, Tamsulosin                               You may not have any metal on your body including  jewelry, and body piercings             Do not wear  lotions, powders, perfumes/cologne, or deodorant             Men may shave face and  neck.   Do not bring valuables to the hospital. [facility].   Contacts, dentures  or bridgework may not be worn into surgery.    Bring CPAP mask and tubing day of surgery.    Patients discharged the day of surgery will not be allowed to drive home.              Please read over the following fact sheets you were given: IF YOU HAVE QUESTIONS ABOUT YOUR PRE OP INSTRUCTIONS PLEASE CALL 3 [phone]   [facility] - Preparing for Surgery Before surgery, you can play an important role.  Because skin is not sterile, your skin needs to be as free of germs as possible.  You can reduce the number of germs on your skin by washing with CHG (chlorahexidine gluconate) soap before surgery.  CHG is an antiseptic cleaner which kills germs and bonds with the skin to continue killing germs even after washing. Please DO NOT use if you have an allergy to CHG or antibacterial soaps.  If your skin becomes reddened/irritated stop using the CHG and inform your nurse when you arrive at Short Stay. Do not shave (including legs and underarms) for at least 48 hours prior to the first CHG shower.  You may shave your face/neck.  Please follow these instructions carefully:  1.  Shower with CHG Soap the night before surgery and the  morning of surgery.  2.  If you choose to wash your hair, wash your hair first as usual with your normal  shampoo.  3.  After you shampoo, rinse your hair and body thoroughly to remove the shampoo.                             4.  Use CHG as you would any other liquid soap.  You can apply chg directly to the skin and wash.  Gently with a scrungie or clean washcloth.  5.  Apply the CHG Soap to your body ONLY FROM THE NECK DOWN.   Do   not use on face/ open                           Wound or open sores. Avoid contact with eyes, ears mouth and   genitals (private parts).                       Wash face,  Genitals (private parts) with your normal soap.             6.  Wash thoroughly, paying special attention to the area where your    surgery  will be performed.  7.  Thoroughly  rinse your body with warm water from the neck down.  8.  DO NOT shower/wash with your normal soap after using and rinsing off the CHG Soap.                9.  Pat yourself dry with a clean towel.            10.  Wear clean pajamas.            11.  Place clean sheets on your bed the night of your first shower and do not  sleep with pets. Day of Surgery : Do not apply  any lotions/deodorants the morning of surgery.  Please wear clean clothes to the hospital/surgery center.  FAILURE TO FOLLOW THESE INSTRUCTIONS MAY RESULT IN THE CANCELLATION OF YOUR SURGERY  PATIENT SIGNATURE_________________________________  NURSE SIGNATURE__________________________________  ________________________________________________________________________

## 2020-10-02 ENCOUNTER — Other Ambulatory Visit: Payer: Self-pay

## 2020-10-02 ENCOUNTER — Encounter (HOSPITAL_COMMUNITY)
Admission: RE | Admit: 2020-10-02 | Discharge: 2020-10-02 | Disposition: A | Discharge status: Home / Self Care | Payer: Medicare Other | Source: Ambulatory Visit | Attending: Urology | Admitting: Urology

## 2020-10-02 ENCOUNTER — Other Ambulatory Visit (HOSPITAL_COMMUNITY)
Admission: RE | Admit: 2020-10-02 | Discharge: 2020-10-02 | Disposition: A | Discharge status: Home / Self Care | Payer: Medicare Other | Source: Ambulatory Visit | Attending: Urology | Admitting: Urology

## 2020-10-02 ENCOUNTER — Encounter (HOSPITAL_COMMUNITY): Payer: Self-pay

## 2020-10-02 DIAGNOSIS — Z20822 Contact with and (suspected) exposure to covid-19: Secondary | ICD-10-CM | POA: Insufficient documentation

## 2020-10-02 DIAGNOSIS — Z01818 Encounter for other preprocedural examination: Secondary | ICD-10-CM | POA: Insufficient documentation

## 2020-10-02 DIAGNOSIS — Z01812 Encounter for preprocedural laboratory examination: Secondary | ICD-10-CM | POA: Insufficient documentation

## 2020-10-02 HISTORY — DX: Depression, unspecified: F32.A

## 2020-10-02 LAB — BASIC METABOLIC PANEL
Anion gap: 7 (ref 5–15)
BUN: 29 mg/dL — ABNORMAL HIGH (ref 8–23)
CO2: 26 mmol/L (ref 22–32)
Calcium: 9.2 mg/dL (ref 8.9–10.3)
Chloride: 105 mmol/L (ref 98–111)
Creatinine, Ser: 1.23 mg/dL (ref 0.61–1.24)
GFR, Estimated: 56 mL/min — ABNORMAL LOW (ref >60)
Glucose, Bld: 92 mg/dL (ref 70–99)
Potassium: 4.6 mmol/L (ref 3.5–5.1)
Sodium: 138 mmol/L (ref 135–145)

## 2020-10-02 LAB — CBC
HCT: 38.5 % — ABNORMAL LOW (ref 39.0–52.0)
Hemoglobin: 12.6 g/dL — ABNORMAL LOW (ref 13.0–17.0)
MCH: 30.5 pg (ref 26.0–34.0)
MCHC: 32.7 g/dL (ref 30.0–36.0)
MCV: 93.2 fL (ref 80.0–100.0)
Platelets: 230 10*3/uL (ref 150–400)
RBC: 4.13 MIL/uL — ABNORMAL LOW (ref 4.22–5.81)
RDW: 12.1 % (ref 11.5–15.5)
WBC: 6.6 10*3/uL (ref 4.0–10.5)
nRBC: 0 % (ref 0.0–0.2)

## 2020-10-03 LAB — SARS CORONAVIRUS 2 (TAT 6-24 HRS): SARS Coronavirus 2: NEGATIVE

## 2020-10-05 NOTE — H&P (Signed)
I have interstitial cystitis.     09/21/20: Jonathan Medina returns today in f/u for cystoscopy for further evaluation of his hematuria and voiding symptoms.    09/16/20: Jonathan Medina returns today in f/u. He had a recent episode of gross hematuria and that will recur when he lifts and strains. He continues to have bladder pain in the lower abdomen after breakfast. He can't tell fecal urgency from bladder urgency. He remains on tamsulosin. He is having to use the oxycodone 5mg  2 a day. He has IC with severe bladder wall inflammation and didn't really benefit from the last HOD in 2019. he has failed PTNS and multiple meds including anticholinergics, Myrbetriq and Gemtesa. He has had EXRT for prostate cancer and his PSA was 0.36 in 1/22. His testosterone is low but not castrate. He had firmagon adjuvant to the radiation.   08/27/20: He denies dysuria, fevers, chills and worsening voiding symptoms. His woife made this appointment due to concerns of lower abdominal pain and gross hematuria.   07/22/20: Jonathan Medina returns today in f/u. He had relief with the cysto with HOD in 8/19. He is no longer on PTNS. He continues to use Oxycodone daily. He has nocturia q61min. He has urgency with UUI. He has stable microhematuria. He has failed Myrbetriq, uribel and oxybutynin. He remains on tamsulosin. He is no longer on PTNS maintenance.     ALLERGIES: Betadine Cephalosporins EPINEPHrine SOLN Iodine SOLN IVP Dye Myrbetriq TB24 nisoldipine Shellfish Simvastatin Statins     MEDICATIONS: Tamsulosin Hcl 0.4 mg capsule 1 capsule PO Daily  Amlodipine Besylate 10 mg tablet  Coreg Cr 20 mg capsule,extended release multiphase 24hr  Oxycodone Hcl 5 mg tablet 1-2 tablet PO Daily PRN     GU PSH: Cysto Bladder Ureth Biopsy - 2019, 2016 Cysto Fulgurate < 0.5 cm - 2017 Cystoscopy Hydrodistention - 2019, 2017 Cystoscopy TURBT 2-5 cm - 2016 Prostate Needle Biopsy - 2016, 2015, 2012       [place] Notes: Cystoscopy With Biopsy, Needle  Biopsy Of Prostate, Cystoscopy With Fulguration Medium Lesion (2-5cm), Biopsy Of The Prostate Needle, Forearm Nonunion Repair, Biopsy Of The Prostate Needle, Surgery Epididymis Excision Of Spermatocele, Lung Surgery, Complete Colonoscopy, Tonsillectomy   NON-GU PSH: Diagnostic Colonoscopy - 2008 Lung Surgery (Unspecified) - 2009 Neuroeltrd Stim Post Tibial - 2020, 2020, 2020, 2020, 2020, 2020, 2020, 06/28/2018, 06/19/2018, 06/12/2018, 06/05/2018, 05/29/2018, 05/24/2018, 05/15/2018 Remove Tonsils - 2008     GU PMH: Gross hematuria, He is having intermittent hematuria. - 09/16/2020, - 06/08/2018, - 05/29/2018, Gross hematuria, - 2017 History of prostate cancer - 09/16/2020, - 08/27/2020, His PSA remains low. , - 07/22/2020 (Improving), His PSA continues to decline., - 04/27/2018 Interstitial Cystitis (with hematuria), He has increased pain and hematuria. I am going to have him return for cystoscopy to assess the cause of bleeding. he might benefit from hyperbaric O2. - 09/16/2020, - 08/27/2020, He has persistent microhematuria with bladder pain, urgency and nocturia. I have refilled the Oxycodone and will have him try Gemtesa. Samples and instructions given. , - 07/22/2020, He is still having pain at times but has decided to try aleve prn instead of another narcotic.. , - 07/31/2019 (Improving), He is doing better with the PTNS with improved daytime symptoms. , - 2020 (Improving), He got some improvement with the HOD but still has marked nocturia with urgency and UUI. I am going to set him up for PTNS to see if that will help. He will see me in 47mo. , - 04/27/2018, - 2019 (Stable), He  has stable symptoms with recurrent hematuria. , 2017/09/11 (Stable), He continues to have pain and nocturia. I discussed repeating the hydrodistention but he wants to hold off of that. I have refilled the Nucynta which he has been using sparingly but he wants to keep it on hand. , 11-Sep-2017, His voiding symptoms have improved but he has  persistent nocturia. I discussed adding possibly oxybutynin at bedtime but he is not interested. , 11-Sep-2016 (Stable), He has had no gross hematuria but has persistent frequency and nocturia. he will stay on his IC diet. , 2016/09/11 (Improving), He has improved but persistent symptoms but he is doing better with that. , September 11, 2016 (Improving), - 2015-09-12, 2015-09-12 Nocturia - 09/16/2020, (Stable), - 07/22/2020, Nocturia, - 09/12/15 Suprapubic pain, I will increased the oxycodone to 2 daily. - 09/16/2020, - 07/22/2020 (Worsening), The pain has become for severe., 2015/09/12 Urinary Urgency - 07/22/2020 Prostate Cancer, I will get a PSA and testosterone in 6 months. 2018/09/12, 2017/09/11 (Improving), His PSA is coming back down with some recovery of the testosterone. I will have him return in 6 months with a PSA and testosterone. , 2017/09/11, His PSA is coming back up with the rising testosterone which is no longer castrate. I will have him return in 3 months with a PSA and testosterone. , 2017/09/11, His PSA is up slightly despite a castrate T. I will repeat it in 4 months. , Sep 11, 2016 (Stable), His PSA continues to decline. He is having a lot of fatigue on the firmagon. I am going to hold that and watch his PSA and testosterone and resume it if need be, but his testosterone will probably stay suppressed for several more months. , 09/11/2016 (Improving), PSA continues to fall on firmagon post EXRT., September 11, 2016 (Improving), - 09/12/15 (Stable), His PSA is down to 2.00, - 2015-09-12, 09/12/2015, Prostate cancer, - 12-Sep-2015 Microscopic hematuria (Stable), stable - 04/27/2018 Urge incontinence (Worsening) - 04/27/2018 Chronic cystitis (with hematuria) (Worsening), He has increased bleeding from the bladder following radiation. - 2015/09/12 Dysuria, Dysuria - 2015/09/12 Oth GU systems Signs/Symptoms, Bladder pain - 12-Sep-2015 Other microscopic hematuria, Microscopic hematuria - September 12, 2015 Urinary Frequency, Urinary frequency - 12-Sep-2015 Elevated PSA, Elevated prostate specific antigen (PSA) - 09/12/14 BPH w/LUTS,  Benign prostatic hyperplasia with urinary obstruction - 2014-09-12 Bladder disorder, Unspec, Bladder disorder - September 12, 2014 Primary hypogonadism, Hypogonadism, testicular - 12-Sep-2014 Inflammatory Disease Prostate, Unspec, Prostatitis - 09/12/14 Urinary Tract Inf, Unspec site, Pyuria - September 11, 2013 Personal Hx Urinary Tract Infections, History of urinary tract infection - 09-11-2013 CIS of prostate/prostatic urethra, PIN III (prostatic intraepithelial neoplasia III) - 09-11-12 ED due to arterial insufficiency, Erectile dysfunction due to arterial insufficiency - 11-Sep-2012      PMH Notes:  2006-09-14 08:34:05 - Note: Benign Polyps Of The Large Intestine  2011-05-18 15:35:34 - Note: Lung Cancer   NON-GU PMH: Low testosterone - 09/16/2020, His testosterone level is low and stable but not castrate. , - 07/22/2020 (Improving), - 09/11/17, 09/11/2016 Encounter for general adult medical examination without abnormal findings, Encounter for preventive health examination - 2013/09/11 COPD, Chronic Obstructive Pulmonary Disease - 09-11-2012 Gout, Gout - 2012/09/11 Hereditary and idiopathic neuropathy, unspecified, Idiopathic Peripheral Neuropathy - 2012/09/11 Personal history of other diseases of the circulatory system, History of hypertension - 09-11-2012 Personal history of other endocrine, nutritional and metabolic disease, History of hypercholesterolemia - September 11, 2012    FAMILY HISTORY: Death In The Family Father - Father Death In The Family  Mother - Mother Family Health Status Number - Runs In Family Prostate Cancer - Father   SOCIAL HISTORY: Marital Status: Married Preferred Language: English; Ethnicity: Not Hispanic Or Latino; Race: Black or African American Current Smoking Status: Patient does not smoke anymore. Has not smoked since 07/04/1998.   Tobacco Use Assessment Completed: Used Tobacco in last 30 days? Has never drank.  Does not drink caffeine. Patient's occupation is/was retired.    REVIEW OF SYSTEMS:    GU Review Male:   Patient denies leakage of urine, have to  strain to urinate , stream starts and stops, penile pain, trouble starting your stream, erection problems, frequent urination, hard to postpone urination, get up at night to urinate, and burning/ pain with urination.  Gastrointestinal (Upper):   Patient denies nausea, vomiting, and indigestion/ heartburn.  Gastrointestinal (Lower):   Patient denies diarrhea and constipation.  Constitutional:   Patient denies fever, night sweats, weight loss, and fatigue.  Skin:   Patient denies skin rash/ lesion and itching.  Eyes:   Patient denies blurred vision and double vision.  Ears/ Nose/ Throat:   Patient denies sore throat and sinus problems.  Hematologic/Lymphatic:   Patient denies swollen glands and easy bruising.  Cardiovascular:   Patient denies leg swelling and chest pains.  Respiratory:   Patient denies cough and shortness of breath.  Endocrine:   Patient denies excessive thirst.  Musculoskeletal:   Patient denies back pain and joint pain.  Neurological:   Patient denies headaches and dizziness.  Psychologic:   Patient denies depression and anxiety.   VITAL SIGNS:      09/21/2020 03:11 PM  BP 166/84 mmHg  Pulse 75 /min  Temperature 97.3 F / 36.2 C   MULTI-SYSTEM PHYSICAL EXAMINATION:    Constitutional: Well-nourished. No physical deformities. Normally developed. Good grooming.  Respiratory: Normal breath sounds. No labored breathing, no use of accessory muscles.   Cardiovascular: Regular rate and rhythm. No murmur, no gallop.      Complexity of Data:  Records Review:   Previous Patient Records  Urine Test Review:   Urinalysis   07/15/20 07/17/19 04/19/18 10/11/17 07/12/17 03/13/17 12/26/16 11/17/16  PSA  Total PSA 0.36 ng/mL 0.54 ng/mL 0.50 ng/mL 0.65 ng/mL 0.81 ng/mL 0.54 ng/mL 0.31 ng/mL 0.29 ng/dl    07/15/20 07/17/19 04/19/18 10/11/17 07/12/17 03/13/17 12/26/16 04/19/16  Hormones  Testosterone, Total 157.0 ng/dL 172.4 ng/dL 121.1 ng/dL 138.0 ng/dL 88.2 ng/dL 37.9 ng/dL <10 ng/dL  16.8 pg/dL    PROCEDURES:         Flexible Cystoscopy - 52000  Risks, benefits, and some of the potential complications of the procedure were discussed. He was prepped with hibiclens. 72ml of 2% lidocaine jelly was instilled intraurethrally.  Cipro 500mg  given for antibiotic prophylaxis.     Meatus:  Normal size. Normal location. Normal condition.  Urethra:  No strictures.  External Sphincter:  Normal.  Verumontanum:  Normal.  Prostate:  Borderline obstructing. Mild hyperplasia. radiation blanching. small middle lobe.   Bladder Neck:  Non-obstructing.  Ureteral Orifices:  Normal location. Normal size. Normal shape. Effluxed clear urine.  Bladder:  Moderate trabeculation. Stable patchy erythema on the posterior wall but there is on area on the dome about 1cm in size that is more worrisome for CIS or a papillary neoplasm.       The procedure was well tolerated and there were no complications.         Urinalysis w/Scope Dipstick Dipstick Cont'd Micro  Color: Yellow Bilirubin: Neg mg/dL WBC/hpf:  0 - 5/hpf  Appearance: Clear Ketones: Neg mg/dL RBC/hpf: 10 - 20/hpf  Specific Gravity: 1.020 Blood: 2+ ery/uL Bacteria: Rare (0-9/hpf)  pH: 6.5 Protein: 1+ mg/dL Cystals: NS (Not Seen)  Glucose: Neg mg/dL Urobilinogen: 0.2 mg/dL Casts: NS (Not Seen)    Nitrites: Neg Trichomonas: Not Present    Leukocyte Esterase: Neg leu/uL Mucous: Not Present      Epithelial Cells: 0 - 5/hpf      Yeast: NS (Not Seen)      Sperm: Not Present    ASSESSMENT:      ICD-10 Details  1 GU:   Interstitial Cystitis (with hematuria) - N30.11 Chronic, Worsening - He has increased pain and has chronic bladder wall erythema. There is a lesion on the dome that is worrisome for CIS or a urothelial CA. I am going to get him set up for cystoscopy with bilateral RTG's and TURBT. With his bladder pain, I am not going to give Gemcitabine. I have reviewed the risks of bleeding, infection, bladder and ureteral injury, thrombotic  events and anesthetic complications.   2   Bladder tumor/neoplasm - D41.4 Undiagnosed New Problem   PLAN:           Schedule Return Visit/Planned Activity: Next Available Appointment - Schedule Surgery  Procedure: Unspecified Date - Cystoscopy TURBT <2 cm - 77116 Notes: Next available          Document Letter(s):  Created for Patient: Clinical Summary         Notes:   CC: Dr. [name].         Next Appointment:      Next Appointment: 01/13/2021 10:00 AM    Appointment Type: Laboratory Appointment    Location: [facility] [phone]    Provider: Lab LAB    Reason for Visit: 6 mo sa

## 2020-10-06 ENCOUNTER — Ambulatory Visit (HOSPITAL_COMMUNITY)
Admission: RE | Admit: 2020-10-06 | Discharge: 2020-10-06 | Disposition: A | Discharge status: Home / Self Care | Payer: Medicare Other | Source: Ambulatory Visit | Attending: Urology | Admitting: Urology

## 2020-10-06 ENCOUNTER — Ambulatory Visit (HOSPITAL_COMMUNITY): Payer: Medicare Other

## 2020-10-06 ENCOUNTER — Encounter (HOSPITAL_COMMUNITY)
Admission: RE | Disposition: A | Discharge status: Home / Self Care | Payer: Self-pay | Source: Ambulatory Visit | Attending: Urology

## 2020-10-06 ENCOUNTER — Encounter (HOSPITAL_COMMUNITY): Payer: Self-pay | Admitting: Urology

## 2020-10-06 ENCOUNTER — Ambulatory Visit (HOSPITAL_COMMUNITY): Payer: Medicare Other | Admitting: Physician Assistant

## 2020-10-06 ENCOUNTER — Ambulatory Visit (HOSPITAL_COMMUNITY): Payer: Medicare Other | Admitting: Certified Registered"

## 2020-10-06 DIAGNOSIS — C61 Malignant neoplasm of prostate: Secondary | ICD-10-CM | POA: Insufficient documentation

## 2020-10-06 DIAGNOSIS — Z881 Allergy status to other antibiotic agents status: Secondary | ICD-10-CM | POA: Diagnosis not present

## 2020-10-06 DIAGNOSIS — N3091 Cystitis, unspecified with hematuria: Secondary | ICD-10-CM | POA: Diagnosis not present

## 2020-10-06 DIAGNOSIS — Z8669 Personal history of other diseases of the nervous system and sense organs: Secondary | ICD-10-CM | POA: Insufficient documentation

## 2020-10-06 DIAGNOSIS — E785 Hyperlipidemia, unspecified: Secondary | ICD-10-CM | POA: Diagnosis not present

## 2020-10-06 DIAGNOSIS — D414 Neoplasm of uncertain behavior of bladder: Secondary | ICD-10-CM | POA: Diagnosis not present

## 2020-10-06 DIAGNOSIS — N3020 Other chronic cystitis without hematuria: Secondary | ICD-10-CM | POA: Diagnosis not present

## 2020-10-06 DIAGNOSIS — Z91041 Radiographic dye allergy status: Secondary | ICD-10-CM | POA: Diagnosis not present

## 2020-10-06 DIAGNOSIS — D494 Neoplasm of unspecified behavior of bladder: Secondary | ICD-10-CM | POA: Diagnosis not present

## 2020-10-06 DIAGNOSIS — N3011 Interstitial cystitis (chronic) with hematuria: Secondary | ICD-10-CM | POA: Insufficient documentation

## 2020-10-06 DIAGNOSIS — Z87891 Personal history of nicotine dependence: Secondary | ICD-10-CM | POA: Insufficient documentation

## 2020-10-06 DIAGNOSIS — G4733 Obstructive sleep apnea (adult) (pediatric): Secondary | ICD-10-CM | POA: Diagnosis not present

## 2020-10-06 DIAGNOSIS — Z8042 Family history of malignant neoplasm of prostate: Secondary | ICD-10-CM | POA: Insufficient documentation

## 2020-10-06 DIAGNOSIS — N3041 Irradiation cystitis with hematuria: Secondary | ICD-10-CM | POA: Diagnosis not present

## 2020-10-06 DIAGNOSIS — N3289 Other specified disorders of bladder: Secondary | ICD-10-CM | POA: Diagnosis not present

## 2020-10-06 DIAGNOSIS — Z888 Allergy status to other drugs, medicaments and biological substances status: Secondary | ICD-10-CM | POA: Insufficient documentation

## 2020-10-06 DIAGNOSIS — Y842 Radiological procedure and radiotherapy as the cause of abnormal reaction of the patient, or of later complication, without mention of misadventure at the time of the procedure: Secondary | ICD-10-CM | POA: Diagnosis not present

## 2020-10-06 DIAGNOSIS — I10 Essential (primary) hypertension: Secondary | ICD-10-CM | POA: Diagnosis not present

## 2020-10-06 DIAGNOSIS — D303 Benign neoplasm of bladder: Secondary | ICD-10-CM | POA: Diagnosis not present

## 2020-10-06 DIAGNOSIS — Z9989 Dependence on other enabling machines and devices: Secondary | ICD-10-CM | POA: Diagnosis not present

## 2020-10-06 HISTORY — PX: TRANSURETHRAL RESECTION OF BLADDER TUMOR: SHX2575

## 2020-10-06 SURGERY — TURBT (TRANSURETHRAL RESECTION OF BLADDER TUMOR)
Anesthesia: General | Site: Bladder | Laterality: Bilateral

## 2020-10-06 MED ORDER — SODIUM CHLORIDE 0.9 % IR SOLN
Status: DC | PRN
  Administered 2020-10-06 (×3): 3000 mL via INTRAVESICAL

## 2020-10-06 MED ORDER — HYDROMORPHONE HCL 1 MG/ML IJ SOLN
INTRAMUSCULAR | Status: AC
  Filled 2020-10-06: qty 2

## 2020-10-06 MED ORDER — SODIUM CHLORIDE 0.9% FLUSH: 3.0000 mL | Freq: Two times a day (BID) | INTRAVENOUS | Status: DC

## 2020-10-06 MED ORDER — AMISULPRIDE (ANTIEMETIC) 5 MG/2ML IV SOLN: 10.0000 mg | Freq: Once | INTRAVENOUS | Status: DC | PRN

## 2020-10-06 MED ORDER — ORAL CARE MOUTH RINSE: 15.0000 mL | Freq: Once | OROMUCOSAL | Status: AC

## 2020-10-06 MED ORDER — LIDOCAINE 2% (20 MG/ML) 5 ML SYRINGE
INTRAMUSCULAR | Status: AC
  Filled 2020-10-06: qty 5

## 2020-10-06 MED ORDER — LIDOCAINE 2% (20 MG/ML) 5 ML SYRINGE
INTRAMUSCULAR | Status: DC | PRN
  Administered 2020-10-06: 50 mg via INTRAVENOUS

## 2020-10-06 MED ORDER — ONDANSETRON HCL 4 MG/2ML IJ SOLN: 4.0000 mg | Freq: Once | INTRAMUSCULAR | Status: DC | PRN

## 2020-10-06 MED ORDER — OXYCODONE HCL 10 MG PO TABS: 10.0000 mg | ORAL_TABLET | Freq: Four times a day (QID) | ORAL | 0 refills | Status: DC | PRN

## 2020-10-06 MED ORDER — FENTANYL CITRATE (PF) 100 MCG/2ML IJ SOLN
INTRAMUSCULAR | Status: DC | PRN
  Administered 2020-10-06: 25 ug via INTRAVENOUS
  Administered 2020-10-06: 50 ug via INTRAVENOUS
  Administered 2020-10-06: 25 ug via INTRAVENOUS

## 2020-10-06 MED ORDER — OXYCODONE HCL 5 MG PO TABS
ORAL_TABLET | ORAL | Status: AC
  Filled 2020-10-06: qty 1

## 2020-10-06 MED ORDER — ONDANSETRON HCL 4 MG/2ML IJ SOLN
INTRAMUSCULAR | Status: AC
  Filled 2020-10-06: qty 2

## 2020-10-06 MED ORDER — LACTATED RINGERS IV SOLN: INTRAVENOUS | Status: DC

## 2020-10-06 MED ORDER — ONDANSETRON HCL 4 MG/2ML IJ SOLN
INTRAMUSCULAR | Status: DC | PRN
  Administered 2020-10-06: 4 mg via INTRAVENOUS

## 2020-10-06 MED ORDER — CIPROFLOXACIN IN D5W 400 MG/200ML IV SOLN
400.0000 mg | INTRAVENOUS | Status: AC
  Administered 2020-10-06: 400 mg via INTRAVENOUS
  Filled 2020-10-06: qty 200

## 2020-10-06 MED ORDER — CHLORHEXIDINE GLUCONATE 0.12 % MT SOLN
15.0000 mL | Freq: Once | OROMUCOSAL | Status: AC
  Administered 2020-10-06: 15 mL via OROMUCOSAL

## 2020-10-06 MED ORDER — HYDROMORPHONE HCL 1 MG/ML IJ SOLN
0.5000 mg | INTRAMUSCULAR | Status: AC | PRN
  Administered 2020-10-06 (×4): 0.5 mg via INTRAVENOUS

## 2020-10-06 MED ORDER — PROPOFOL 10 MG/ML IV BOLUS
INTRAVENOUS | Status: DC | PRN
  Administered 2020-10-06: 130 mg via INTRAVENOUS

## 2020-10-06 MED ORDER — ROCURONIUM BROMIDE 10 MG/ML (PF) SYRINGE
PREFILLED_SYRINGE | INTRAVENOUS | Status: AC
  Filled 2020-10-06: qty 10

## 2020-10-06 MED ORDER — ROCURONIUM BROMIDE 10 MG/ML (PF) SYRINGE
PREFILLED_SYRINGE | INTRAVENOUS | Status: DC | PRN
  Administered 2020-10-06: 60 mg via INTRAVENOUS

## 2020-10-06 MED ORDER — SUGAMMADEX SODIUM 200 MG/2ML IV SOLN
INTRAVENOUS | Status: DC | PRN
  Administered 2020-10-06: 200 mg via INTRAVENOUS

## 2020-10-06 MED ORDER — DEXAMETHASONE SODIUM PHOSPHATE 10 MG/ML IJ SOLN
INTRAMUSCULAR | Status: DC | PRN
  Administered 2020-10-06: 4 mg via INTRAVENOUS

## 2020-10-06 MED ORDER — FENTANYL CITRATE (PF) 100 MCG/2ML IJ SOLN
INTRAMUSCULAR | Status: AC
  Filled 2020-10-06: qty 4

## 2020-10-06 MED ORDER — FENTANYL CITRATE (PF) 100 MCG/2ML IJ SOLN
25.0000 ug | INTRAMUSCULAR | Status: DC | PRN
  Administered 2020-10-06 (×3): 50 ug via INTRAVENOUS

## 2020-10-06 MED ORDER — FENTANYL CITRATE (PF) 100 MCG/2ML IJ SOLN
INTRAMUSCULAR | Status: AC
  Filled 2020-10-06: qty 2

## 2020-10-06 MED ORDER — OXYCODONE HCL 5 MG/5ML PO SOLN: 5.0000 mg | Freq: Once | ORAL | Status: AC | PRN

## 2020-10-06 MED ORDER — DEXAMETHASONE SODIUM PHOSPHATE 10 MG/ML IJ SOLN
INTRAMUSCULAR | Status: AC
  Filled 2020-10-06: qty 1

## 2020-10-06 MED ORDER — PROPOFOL 10 MG/ML IV BOLUS
INTRAVENOUS | Status: AC
  Filled 2020-10-06: qty 20

## 2020-10-06 MED ORDER — OXYCODONE HCL 5 MG PO TABS
5.0000 mg | ORAL_TABLET | Freq: Once | ORAL | Status: AC | PRN
  Administered 2020-10-06: 5 mg via ORAL

## 2020-10-06 MED ORDER — OXYCODONE HCL 5 MG PO TABS
5.0000 mg | ORAL_TABLET | ORAL | Status: DC | PRN
Start: 2020-10-06 — End: 2020-10-06
  Administered 2020-10-06: 5 mg via ORAL

## 2020-10-06 SURGICAL SUPPLY — 25 items
BAG DRN RND TRDRP ANRFLXCHMBR (UROLOGICAL SUPPLIES)
BAG URINE DRAIN 2000ML AR STRL (UROLOGICAL SUPPLIES) IMPLANT
BAG URINE LEG 500ML (DRAIN) ×1 IMPLANT
BAG URO CATCHER STRL LF (MISCELLANEOUS) ×2 IMPLANT
CATH FOLEY 2WAY SLVR  5CC 20FR (CATHETERS) ×2
CATH FOLEY 2WAY SLVR 5CC 20FR (CATHETERS) IMPLANT
CATH FOLEY 3WAY 30CC 22FR (CATHETERS) IMPLANT
CATH URET 5FR 28IN OPEN ENDED (CATHETERS) ×1 IMPLANT
DRAPE FOOT SWITCH (DRAPES) ×2 IMPLANT
GLOVE SURG POLYISO LF SZ8 (GLOVE) IMPLANT
GOWN STRL REUS W/TWL XL LVL3 (GOWN DISPOSABLE) ×2 IMPLANT
GUIDEWIRE ANG ZIPWIRE 038X150 (WIRE) ×1 IMPLANT
GUIDEWIRE STR DUAL SENSOR (WIRE) ×1 IMPLANT
HOLDER FOLEY CATH W/STRAP (MISCELLANEOUS) IMPLANT
KIT TURNOVER KIT A (KITS) ×2 IMPLANT
LOOP CUT BIPOLAR 24F LRG (ELECTROSURGICAL) IMPLANT
MANIFOLD NEPTUNE II (INSTRUMENTS) ×2 IMPLANT
PACK CYSTO (CUSTOM PROCEDURE TRAY) ×2 IMPLANT
SUT ETHILON 3 0 PS 1 (SUTURE) IMPLANT
SYR 10ML LL (SYRINGE) ×1 IMPLANT
SYR 30ML LL (SYRINGE) IMPLANT
SYR TOOMEY IRRIG 70ML (MISCELLANEOUS)
SYRINGE TOOMEY IRRIG 70ML (MISCELLANEOUS) IMPLANT
TUBING CONNECTING 10 (TUBING) ×2 IMPLANT
TUBING UROLOGY SET (TUBING) ×2 IMPLANT

## 2020-10-06 NOTE — Anesthesia Preprocedure Evaluation (Signed)
Anesthesia Evaluation  Patient identified by MRN, date of birth, ID band Patient awake    Reviewed: Allergy & Precautions, NPO status , Patient's Chart, lab work & pertinent test results, reviewed documented beta blocker date and time   History of Anesthesia Complications Negative for: history of anesthetic complications  Airway Mallampati: II  TM Distance: >3 FB Neck ROM: Full    Dental  (+) Teeth Intact   Pulmonary sleep apnea and Continuous Positive Airway Pressure Ventilation , COPD, former smoker,  Lung cancer s/p wedge resection   Pulmonary exam normal        Cardiovascular hypertension, Normal cardiovascular exam     Neuro/Psych Anxiety Depression negative neurological ROS     GI/Hepatic negative GI ROS, Neg liver ROS,   Endo/Other  negative endocrine ROS  Renal/GU Renal InsufficiencyRenal disease   Bladder lesion H/o prostate cancer    Musculoskeletal  (+) Arthritis ,   Abdominal   Peds  Hematology  (+) anemia ,   Anesthesia Other Findings   Reproductive/Obstetrics                             Anesthesia Physical Anesthesia Plan  ASA: III  Anesthesia Plan: General   Post-op Pain Management:    Induction: Intravenous  PONV Risk Score and Plan: 2 and Ondansetron, Dexamethasone, Treatment may vary due to age or medical condition and Midazolam  Airway Management Planned: Oral ETT  Additional Equipment: None  Intra-op Plan:   Post-operative Plan: Extubation in OR  Informed Consent: I have reviewed the patients History and Physical, chart, labs and discussed the procedure including the risks, benefits and alternatives for the proposed anesthesia with the patient or authorized representative who has indicated his/her understanding and acceptance.     Dental advisory given  Plan Discussed with:   Anesthesia Plan Comments:         Anesthesia Quick  Evaluation

## 2020-10-06 NOTE — Anesthesia Postprocedure Evaluation (Signed)
Anesthesia Post Note  Patient: Jonathan Medina  Procedure(s) Performed: TRANSURETHRAL RESECTION OF BLADDER TUMOR (TURBT) CYSTOSCOPY BILATERAL RETROGRADES (Bilateral Bladder)     Patient location during evaluation: PACU Anesthesia Type: General Level of consciousness: awake and alert Pain management: pain level controlled Vital Signs Assessment: post-procedure vital signs reviewed and stable Respiratory status: spontaneous breathing, nonlabored ventilation and respiratory function stable Cardiovascular status: blood pressure returned to baseline and stable Postop Assessment: no apparent nausea or vomiting Anesthetic complications: no   No complications documented.  Last Vitals:  Vitals:   10/06/20 1115 10/06/20 1121  BP:  (!) 165/76  Pulse: (!) 59 66  Resp:  16  Temp:  36.4 C  SpO2: 95% 92%    Last Pain:  Vitals:   10/06/20 1100  TempSrc:   PainSc: 5                  Lidia Collum

## 2020-10-06 NOTE — Discharge Instructions (Signed)
Transurethral Resection of Bladder Tumor, Care After This sheet gives you information about how to care for yourself after your procedure. Your health care provider may also give you more specific instructions. If you have problems or questions, contact your health care provider. What can I expect after the procedure? After the procedure, it is common to have:  A small amount of blood in your urine for up to 2 weeks.  Soreness or mild pain from your catheter. After your catheter is removed, you may have mild soreness, especially when urinating.  Pain in your lower abdomen. Follow these instructions at home: Medicines  Take over-the-counter and prescription medicines only as told by your health care provider.  If you were prescribed an antibiotic medicine, take it as told by your health care provider. Do not stop taking the antibiotic even if you start to feel better.  Do not drive for 24 hours if you were given a sedative during your procedure.  Ask your health care provider if the medicine prescribed to you: ? Requires you to avoid driving or using heavy machinery. ? Can cause constipation. You may need to take these actions to prevent or treat constipation:  Take over-the-counter or prescription medicines.  Eat foods that are high in fiber, such as beans, whole grains, and fresh fruits and vegetables.  Limit foods that are high in fat and processed sugars, such as fried or sweet foods.   Activity  Return to your normal activities as told by your health care provider. Ask your health care provider what activities are safe for you.  Do not lift anything that is heavier than 10 lb (4.5 kg), or the limit that you are told, until your health care provider says that it is safe.  Avoid intense physical activity for as long as told by your health care provider.  Rest as told by your health care provider.  Avoid sitting for a long time without moving. Get up to take short walks every  1-2 hours. This is important to improve blood flow and breathing. Ask for help if you feel weak or unsteady. General instructions  Do not drink alcohol for as long as told by your health care provider. This is especially important if you are taking prescription pain medicines.  Do not take baths, swim, or use a hot tub until your health care provider approves. Ask your health care provider if you may take showers. You may only be allowed to take sponge baths.  If you have a catheter, follow instructions from your health care provider about caring for your catheter and your drainage bag.  Drink enough fluid to keep your urine pale yellow.  Wear compression stockings as told by your health care provider. These stockings help to prevent blood clots and reduce swelling in your legs.  Keep all follow-up visits as told by your health care provider. This is important. ? You will need to be followed closely with regular checks of your bladder and urethra (cystoscopies) to make sure that the cancer does not come back.   Contact a health care provider if:  You have pain that gets worse or does not improve with medicine.  You have blood in your urine for more than 2 weeks.  You have cloudy or bad-smelling urine.  You become constipated. Signs of constipation may include having: ? Fewer than three bowel movements in a week. ? Difficulty having a bowel movement. ? Stools that are dry, hard, or larger than normal.  You have a fever. Get help right away if:  You have: ? Severe pain. ? Bright red blood in your urine. ? Blood clots in your urine. ? A lot of blood in your urine.  Your catheter has been removed and you are not able to urinate.  You have a catheter in place and the catheter is not draining urine. Summary  After your procedure, it is common to have a small amount of blood in your urine, soreness or mild pain from your catheter, and pain in your lower abdomen.  Take  over-the-counter and prescription medicines only as told by your health care provider.  Rest as told by your health care provider. Follow your health care provider's instructions about returning to normal activities. Ask what activities are safe for you.  If you have a catheter, follow instructions from your health care provider about caring for your catheter and your drainage bag.  Get help right away if you cannot urinate, you have severe pain, or you have bright red blood or blood clots in your urine.  You may remove the catheter on Thursday morning.  To remove the catheter, drain the balloon by cutting off the yellow nipple with scissors and letting the fluid drain out.  The catheter should just slide out.  If you don't feel comfortable doing this, please call the office to come have it removed.   This information is not intended to replace advice given to you by your health care provider. Make sure you discuss any questions you have with your health care provider. Document Revised: 01/18/2018 Document Reviewed: 01/18/2018 Elsevier Patient Education  [employer].

## 2020-10-06 NOTE — Transfer of Care (Signed)
Immediate Anesthesia Transfer of Care Note  Patient: SRIHAN BRUTUS  Procedure(s) Performed: TRANSURETHRAL RESECTION OF BLADDER TUMOR (TURBT) CYSTOSCOPY BILATERAL RETROGRADES (Bilateral Bladder)  Patient Location: PACU  Anesthesia Type:General  Level of Consciousness: awake, alert  and oriented  Airway & Oxygen Therapy: Patient Spontanous Breathing and Patient connected to face mask oxygen  Post-op Assessment: Report given to RN, Post -op Vital signs reviewed and stable and Patient moving all extremities X 4  Post vital signs: Reviewed and stable  Last Vitals:  Vitals Value Taken Time  BP    Temp    Pulse 60 10/06/20 0957  Resp 26 10/06/20 0957  SpO2 100 % 10/06/20 0957  Vitals shown include unvalidated device data.  Last Pain:  Vitals:   10/06/20 0725  TempSrc:   PainSc: 5          Complications: No complications documented.

## 2020-10-06 NOTE — Op Note (Signed)
Procedure: 1.  Cystoscopy with bilateral retrograde pyelograms and interpretation. 2.  Transurethral resection of large bladder tumor.  Preop diagnosis: Hematuria with radiation cystitis and probable bladder tumor.  Postop diagnosis: Hematuria with papillary bladder tumors of overlapping sites and radiation cystitis.  Surgeon: Dr. Irine Seal.  Anesthesia: General.  Specimen: Bladder tumor chips.  Drains: 20 French Foley catheter.  EBL: 10 mL.  Complications: None.  Indications: The patient is an 87 year old male who has a long history of interstitial cystitis with bladder ulceration and pain.  He is also had prostate cancer treated with radiation therapy.  He has had recent increased hematuria and pain and office cystoscopy demonstrated multiple small papillary lesions on the dome and posterior wall that are worrisome for urothelial carcinoma.  There was extensive erythema of the dome posterior wall and right lateral wall as well consistent with his prior findings of interstitial cystitis and chronic inflammation.  It was felt that TURBT and bilateral retrograde pyelography was indicated.  Procedure: He was given antibiotics.  General anesthetic was induced with paralysis to avoid the obturator reflex.  He was placed in lithotomy position and fitted with PAS hose.  His perineum and genitalia were prepped with Hibiclens and he was draped in usual sterile fashion.  Cystoscopy was performed using the 21 [place] scope and 30 degree lens.  Examination revealed a normal urethra.  The external sphincter was somewhat blanched but otherwise intact.  The prostatic urethra was widely patent with blanching and radiation neovascularity.  Examination of bladder was difficult because of near immediate bleeding from the dome that obscured visualization.  Prior to the bleeding beginning I did note multiple small papillary lesions extensively on the dome posterior wall and right lateral wall consistent with  urothelial carcinoma.  There was associated extensive erythema of the right lateral wall posterior wall and dome as well.  Initially the ureteral orifices were difficult to locate because of the bleeding and the radiation changes particularly on the right of the trigone.  I was able to identify the ureteral orifices and using a 5 [place] open-ended catheter and Omnipaque perform bilateral retrograde pyelograms.  The left retrograde pyelogram demonstrated narrowing of the intramural ureter consistent with bladder wall fibrosis and proximally there was dilation with tortuosity to the mildly dilated collecting system.  The contrast eventually completely cleared from the collecting system.  The right retrograde pyelogram was more difficult to do J hooking of the distal ureter, but I was able to visualize the distal mid and proximal ureter without evidence of filling defects and minimal dilation.  The renal pelvis filled but the calyces did not.  The contrast eventually cleared from the collecting system.  I attempted to pass a wire up the right ureteral orifice to advance the catheter to do a more complete retrograde pyelogram but I was unsuccessful due to the J hooking.  The cystoscope was removed and a 26 French continuous-flow resectoscope sheath was inserted.  This was fitted with an Beatrix Fetters handle with a 30 degree lens and a bipolar loop.  Saline was used as the irrigant.  Several tumors were resected from the posterior wall, right lateral wall and dome and were collected for pathologic analysis.  Additional resection of some of the most severely inflamed areas was performed as well as fulguration of a generous portion of the right lateral wall done posterior wall anterior wall and the anterior left lateral wall to try to eliminate the inflammatory changes which bled easily.  There is involvement  was greater than 5 cm in diameter.  Once the resection was completed, inspection demonstrated no retained  tissue, no active bleeding and no bladder wall perforation.  Ureteral orifices remained intact.  A 20 French Foley catheter was inserted and the balloon was filled with 10 mL of sterile fluid.  The catheter was placed to straight drainage.  He was taken down from lithotomy position, his anesthetic was reversed and he was moved to recovery room in stable condition.  There were no complications.

## 2020-10-06 NOTE — Interval H&P Note (Signed)
History and Physical Interval Note:  10/06/2020 8:23 AM  Jonathan Medina  has presented today for surgery, with the diagnosis of BLADDER LESION.  The various methods of treatment have been discussed with the patient and family. After consideration of risks, benefits and other options for treatment, the patient has consented to  Procedure(s): TRANSURETHRAL RESECTION OF BLADDER TUMOR (TURBT) CYSTOSCOPY BILATERAL RETROGRADES (Bilateral) as a surgical intervention.  The patient's history has been reviewed, patient examined, no change in status, stable for surgery.  I have reviewed the patient's chart and labs.  Questions were answered to the patient's satisfaction.     Irine Seal

## 2020-10-06 NOTE — Anesthesia Procedure Notes (Signed)
Procedure Name: Intubation Date/Time: 10/06/2020 8:48 AM Performed by: Niel Hummer, CRNA Pre-anesthesia Checklist: Patient identified, Suction available, Emergency Drugs available and Patient being monitored Patient Re-evaluated:Patient Re-evaluated prior to induction Oxygen Delivery Method: Circle system utilized Preoxygenation: Pre-oxygenation with 100% oxygen Induction Type: IV induction Ventilation: Mask ventilation without difficulty Laryngoscope Size: Mac and 4 Grade View: Grade I Tube type: Oral Tube size: 7.5 mm Number of attempts: 1 Airway Equipment and Method: Stylet Placement Confirmation: ETT inserted through vocal cords under direct vision,  positive ETCO2 and breath sounds checked- equal and bilateral Secured at: 23 cm Tube secured with: Tape Dental Injury: Teeth and Oropharynx as per pre-operative assessment  Comments: 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 Easily passed tube.

## 2020-10-07 ENCOUNTER — Encounter (HOSPITAL_COMMUNITY): Payer: Self-pay | Admitting: Urology

## 2020-10-07 LAB — SURGICAL PATHOLOGY

## 2020-10-14 DIAGNOSIS — Z23 Encounter for immunization: Secondary | ICD-10-CM | POA: Diagnosis not present

## 2020-10-19 DIAGNOSIS — N3941 Urge incontinence: Secondary | ICD-10-CM | POA: Diagnosis not present

## 2020-10-19 DIAGNOSIS — D414 Neoplasm of uncertain behavior of bladder: Secondary | ICD-10-CM | POA: Diagnosis not present

## 2020-10-19 DIAGNOSIS — N3021 Other chronic cystitis with hematuria: Secondary | ICD-10-CM | POA: Diagnosis not present

## 2020-10-19 DIAGNOSIS — N3011 Interstitial cystitis (chronic) with hematuria: Secondary | ICD-10-CM | POA: Diagnosis not present

## 2020-11-10 DIAGNOSIS — H02831 Dermatochalasis of right upper eyelid: Secondary | ICD-10-CM | POA: Diagnosis not present

## 2020-11-10 DIAGNOSIS — H02834 Dermatochalasis of left upper eyelid: Secondary | ICD-10-CM | POA: Diagnosis not present

## 2020-11-10 DIAGNOSIS — H2513 Age-related nuclear cataract, bilateral: Secondary | ICD-10-CM | POA: Diagnosis not present

## 2020-12-16 DIAGNOSIS — N3011 Interstitial cystitis (chronic) with hematuria: Secondary | ICD-10-CM | POA: Diagnosis not present

## 2020-12-18 ENCOUNTER — Telehealth: Payer: Self-pay | Admitting: Internal Medicine

## 2020-12-18 DIAGNOSIS — I10 Essential (primary) hypertension: Secondary | ICD-10-CM

## 2020-12-21 ENCOUNTER — Other Ambulatory Visit: Payer: Self-pay | Admitting: Internal Medicine

## 2020-12-21 DIAGNOSIS — I10 Essential (primary) hypertension: Secondary | ICD-10-CM

## 2020-12-21 MED ORDER — CARVEDILOL PHOSPHATE ER 20 MG PO CP24: 20.0000 mg | ORAL_CAPSULE | Freq: Every morning | ORAL | 0 refills | Status: DC

## 2020-12-21 NOTE — Telephone Encounter (Signed)
Patient is requesting a short supply of carvedilol (COREG CR) 20 MG 24 hr capsule. He is scheduled for 01-21-21. Please advise.    [id number] - [place], [place] - [address]

## 2021-01-21 ENCOUNTER — Other Ambulatory Visit: Payer: Self-pay

## 2021-01-21 ENCOUNTER — Ambulatory Visit (INDEPENDENT_AMBULATORY_CARE_PROVIDER_SITE_OTHER): Payer: Medicare Other | Admitting: Internal Medicine

## 2021-01-21 ENCOUNTER — Encounter: Payer: Self-pay | Admitting: Internal Medicine

## 2021-01-21 VITALS — BP 136/76 | HR 55 | Temp 98.4°F | Resp 16 | Ht 68.0 in | Wt 208.0 lb

## 2021-01-21 DIAGNOSIS — N401 Enlarged prostate with lower urinary tract symptoms: Secondary | ICD-10-CM | POA: Diagnosis not present

## 2021-01-21 DIAGNOSIS — I10 Essential (primary) hypertension: Secondary | ICD-10-CM | POA: Diagnosis not present

## 2021-01-21 DIAGNOSIS — R3911 Hesitancy of micturition: Secondary | ICD-10-CM | POA: Diagnosis not present

## 2021-01-21 MED ORDER — CARVEDILOL PHOSPHATE ER 20 MG PO CP24: 20.0000 mg | ORAL_CAPSULE | Freq: Every morning | ORAL | 0 refills | Status: DC

## 2021-01-21 MED ORDER — AMLODIPINE BESYLATE 5 MG PO TABS: 5.0000 mg | ORAL_TABLET | Freq: Every day | ORAL | 1 refills | Status: DC

## 2021-01-21 MED ORDER — TAMSULOSIN HCL 0.4 MG PO CAPS: 0.4000 mg | ORAL_CAPSULE | Freq: Every day | ORAL | 1 refills | Status: DC

## 2021-01-23 NOTE — Progress Notes (Signed)
Subjective:  Patient ID: Jonathan Medina, male    DOB: 1930/01/19  Age: 87 y.o. MRN: [id number]  CC: Hypertension  This visit occurred during the SARS-CoV-2 public health emergency.  Safety protocols were in place, including screening questions prior to the visit, additional usage of staff PPE, and extensive cleaning of exam room while observing appropriate contact time as indicated for disinfecting solutions.    HPI Jonathan Medina presents for f/up -  His BP has been well controlled.  Outpatient Medications Prior to Visit  Medication Sig Dispense Refill   oxyCODONE (OXY IR/ROXICODONE) 5 MG immediate release tablet Take 5 mg by mouth in the morning and at bedtime.     Oxycodone HCl 10 MG TABS Take 1 tablet (10 mg total) by mouth every 6 (six) hours as needed (breakthrough pain). 20 tablet 0   amLODipine (NORVASC) 5 MG tablet TAKE 1 TABLET DAILY (Patient taking differently: Take 5 mg by mouth in the morning.) 90 tablet 1   carvedilol (COREG CR) 20 MG 24 hr capsule Take 1 capsule (20 mg total) by mouth every morning. 90 capsule 0   tamsulosin (FLOMAX) 0.4 MG CAPS capsule Take 0.4 mg by mouth daily after breakfast.     No facility-administered medications prior to visit.    ROS Review of Systems  Constitutional:  Negative for diaphoresis and fatigue.  HENT: Negative.    Eyes: Negative.   Respiratory:  Negative for chest tightness, shortness of breath and wheezing.   Cardiovascular:  Negative for chest pain, palpitations and leg swelling.  Gastrointestinal:  Negative for abdominal pain, constipation, diarrhea and nausea.  Endocrine: Negative.   Genitourinary:  Positive for difficulty urinating and enuresis.  Musculoskeletal: Negative.  Negative for arthralgias.  Skin: Negative.   Neurological: Negative.  Negative for weakness and light-headedness.  Hematological:  Negative for adenopathy. Does not bruise/bleed easily.  Psychiatric/Behavioral: Negative.     Objective:  BP 136/76  (BP Location: Right Arm, Patient Position: Sitting, Cuff Size: Large)   Pulse (!) 55   Temp 98.4 F (36.9 C) (Oral)   Resp 16   Ht 5\' 8"  (1.727 m)   Wt 208 lb (94.3 kg)   SpO2 96%   BMI 31.63 kg/m   BP Readings from Last 3 Encounters:  01/21/21 136/76  10/06/20 (!) 160/76  10/02/20 (!) 150/73    Wt Readings from Last 3 Encounters:  01/21/21 208 lb (94.3 kg)  10/06/20 202 lb (91.6 kg)  10/02/20 202 lb (91.6 kg)    Physical Exam Vitals reviewed.  HENT:     Mouth/Throat:     Mouth: Mucous membranes are moist.  Eyes:     Conjunctiva/sclera: Conjunctivae normal.  Cardiovascular:     Rate and Rhythm: Normal rate and regular rhythm.     Heart sounds: No murmur heard. Pulmonary:     Breath sounds: No stridor. No wheezing, rhonchi or rales.  Abdominal:     General: Abdomen is protuberant. There is no distension.     Palpations: Abdomen is soft. There is no hepatomegaly, splenomegaly or mass.     Tenderness: There is no abdominal tenderness.  Musculoskeletal:        General: Normal range of motion.     Cervical back: Neck supple.     Right lower leg: No edema.     Left lower leg: No edema.  Lymphadenopathy:     Cervical: No cervical adenopathy.  Skin:    General: Skin is warm and dry.  Neurological:  General: No focal deficit present.     Mental Status: He is alert.    Lab Results  Component Value Date   WBC 6.6 10/02/2020   HGB 12.6 (L) 10/02/2020   HCT 38.5 (L) 10/02/2020   PLT 230 10/02/2020   GLUCOSE 92 10/02/2020   CHOL 250 (H) 09/10/2019   TRIG 192.0 (H) 10/21/2019   HDL 39.50 09/10/2019   LDLDIRECT 147.0 09/10/2019   LDLCALC 115 08/30/2018   ALT 14 09/10/2019   AST 15 09/10/2019   NA 138 10/02/2020   K 4.6 10/02/2020   CL 105 10/02/2020   CREATININE 1.23 10/02/2020   BUN 29 (H) 10/02/2020   CO2 26 10/02/2020   TSH 1.71 09/10/2019   PSA 0.54 07/17/2019   INR 0.9 08/07/2007   HGBA1C 5.9 06/16/2020    No results found.  Assessment & Plan:    Willam was seen today for hypertension.  Diagnoses and all orders for this visit:  Benign prostatic hyperplasia with urinary hesitancy -     tamsulosin (FLOMAX) 0.4 MG CAPS capsule; Take 1 capsule (0.4 mg total) by mouth daily after breakfast.  Essential hypertension- His blood pressure is adequately well controlled.  He has mild bradycardia but is asymptomatic with this.  Will continue the combination of carvedilol and amlodipine. -     carvedilol (COREG CR) 20 MG 24 hr capsule; Take 1 capsule (20 mg total) by mouth every morning. -     amLODipine (NORVASC) 5 MG tablet; Take 1 tablet (5 mg total) by mouth daily.  I have changed Wanda L. Merriwether's amLODipine and tamsulosin. I am also having him maintain his oxyCODONE, Oxycodone HCl, and carvedilol.  Meds ordered this encounter  Medications   carvedilol (COREG CR) 20 MG 24 hr capsule    Sig: Take 1 capsule (20 mg total) by mouth every morning.    Dispense:  90 capsule    Refill:  0   amLODipine (NORVASC) 5 MG tablet    Sig: Take 1 tablet (5 mg total) by mouth daily.    Dispense:  90 tablet    Refill:  1   tamsulosin (FLOMAX) 0.4 MG CAPS capsule    Sig: Take 1 capsule (0.4 mg total) by mouth daily after breakfast.    Dispense:  90 capsule    Refill:  1     Follow-up: No follow-ups on file.  [name], MD

## 2021-01-27 DIAGNOSIS — N3011 Interstitial cystitis (chronic) with hematuria: Secondary | ICD-10-CM | POA: Diagnosis not present

## 2021-01-27 DIAGNOSIS — N3941 Urge incontinence: Secondary | ICD-10-CM | POA: Diagnosis not present

## 2021-03-22 ENCOUNTER — Telehealth: Payer: Self-pay | Admitting: Internal Medicine

## 2021-03-22 ENCOUNTER — Other Ambulatory Visit: Payer: Self-pay | Admitting: Internal Medicine

## 2021-03-22 DIAGNOSIS — I10 Essential (primary) hypertension: Secondary | ICD-10-CM

## 2021-03-22 NOTE — Telephone Encounter (Signed)
Rx sent yday.  Called pt, LVM.

## 2021-03-22 NOTE — Telephone Encounter (Signed)
1.Medication Requested:  amLODipine (NORVASC) 5 MG tablet tamsulosin (FLOMAX) 0.4 MG CAPS capsule   2. Pharmacy (Name, Street, [place]): [facility], [place]  Phone:  [phone] Fax:  [phone]   3. On Med List: yes  4. Last Visit with PCP: 07.21.22  5. Next visit date with PCP: n/a   Agent: Please be advised that RX refills may take up to 3 business days. We ask that you follow-up with your pharmacy.

## 2021-03-22 NOTE — Telephone Encounter (Signed)
Patient requesting 90DS for previous refill request (SEE BELOW)

## 2021-03-23 DIAGNOSIS — Z23 Encounter for immunization: Secondary | ICD-10-CM | POA: Diagnosis not present

## 2021-04-26 DIAGNOSIS — R3982 Chronic bladder pain: Secondary | ICD-10-CM | POA: Diagnosis not present

## 2021-04-26 DIAGNOSIS — N3011 Interstitial cystitis (chronic) with hematuria: Secondary | ICD-10-CM | POA: Diagnosis not present

## 2021-04-26 DIAGNOSIS — R3121 Asymptomatic microscopic hematuria: Secondary | ICD-10-CM | POA: Diagnosis not present

## 2021-04-26 DIAGNOSIS — Z8546 Personal history of malignant neoplasm of prostate: Secondary | ICD-10-CM | POA: Diagnosis not present

## 2021-04-26 DIAGNOSIS — N3942 Incontinence without sensory awareness: Secondary | ICD-10-CM | POA: Diagnosis not present

## 2021-04-27 ENCOUNTER — Ambulatory Visit (INDEPENDENT_AMBULATORY_CARE_PROVIDER_SITE_OTHER): Payer: Medicare Other | Admitting: Adult Health

## 2021-04-27 ENCOUNTER — Other Ambulatory Visit: Payer: Self-pay

## 2021-04-27 ENCOUNTER — Encounter: Payer: Self-pay | Admitting: Adult Health

## 2021-04-27 DIAGNOSIS — Z9989 Dependence on other enabling machines and devices: Secondary | ICD-10-CM | POA: Diagnosis not present

## 2021-04-27 DIAGNOSIS — G4733 Obstructive sleep apnea (adult) (pediatric): Secondary | ICD-10-CM | POA: Diagnosis not present
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
## 2021-04-27 NOTE — Assessment & Plan Note (Signed)
Healthy weight loss discussed 

## 2021-04-27 NOTE — Patient Instructions (Addendum)
Continue on CPAP At bedtime   Keep up good work .  Do not drive if sleepy.  Work on healthy weight.  Follow up Dr. Elsworth Soho  or Sutton Hirsch NP In 1 year  And As needed

## 2021-04-27 NOTE — Assessment & Plan Note (Signed)
Obstructive sleep apnea with excellent control compliance on nocturnal CPAP continue on current settings  Plan  Patient Instructions  Continue on CPAP At bedtime   Keep up good work .  Do not drive if sleepy.  Work on healthy weight.  Follow up Dr. Elsworth Soho  or Cabrina Shiroma NP In 1 year  And As needed

## 2021-06-04 ENCOUNTER — Other Ambulatory Visit: Payer: Self-pay | Admitting: Internal Medicine

## 2021-06-04 DIAGNOSIS — I10 Essential (primary) hypertension: Secondary | ICD-10-CM

## 2021-06-07 ENCOUNTER — Telehealth: Payer: Self-pay | Admitting: Internal Medicine

## 2021-06-07 ENCOUNTER — Other Ambulatory Visit: Payer: Self-pay | Admitting: Internal Medicine

## 2021-06-07 DIAGNOSIS — I10 Essential (primary) hypertension: Secondary | ICD-10-CM

## 2021-06-07 MED ORDER — AMLODIPINE BESYLATE 5 MG PO TABS: 5.0000 mg | ORAL_TABLET | Freq: Every day | ORAL | 0 refills | Status: DC

## 2021-06-07 NOTE — Telephone Encounter (Signed)
1.Medication Requested: amLODipine (NORVASC) 5 MG tablet  2. Pharmacy (Name, Street, [place]): [facility], [place]  Phone:  [phone] Fax:  [phone]   3. On Med List: yes  4. Last Visit with PCP: 07.21.22  5. Next visit date with PCP: n/a   Agent: Please be advised that RX refills may take up to 3 business days. We ask that you follow-up with your pharmacy.

## 2021-06-29 DIAGNOSIS — Z23 Encounter for immunization: Secondary | ICD-10-CM | POA: Diagnosis not present

## 2021-07-06 DIAGNOSIS — H2513 Age-related nuclear cataract, bilateral: Secondary | ICD-10-CM | POA: Diagnosis not present

## 2021-07-06 DIAGNOSIS — H02834 Dermatochalasis of left upper eyelid: Secondary | ICD-10-CM | POA: Diagnosis not present

## 2021-07-06 DIAGNOSIS — H02831 Dermatochalasis of right upper eyelid: Secondary | ICD-10-CM | POA: Diagnosis not present

## 2021-07-19 DIAGNOSIS — Z8546 Personal history of malignant neoplasm of prostate: Secondary | ICD-10-CM | POA: Diagnosis not present

## 2021-07-26 DIAGNOSIS — Z8546 Personal history of malignant neoplasm of prostate: Secondary | ICD-10-CM | POA: Diagnosis not present

## 2021-07-26 DIAGNOSIS — N3941 Urge incontinence: Secondary | ICD-10-CM | POA: Diagnosis not present

## 2021-07-26 DIAGNOSIS — N3011 Interstitial cystitis (chronic) with hematuria: Secondary | ICD-10-CM | POA: Diagnosis not present

## 2021-07-26 DIAGNOSIS — R351 Nocturia: Secondary | ICD-10-CM | POA: Diagnosis not present

## 2021-07-26 DIAGNOSIS — N401 Enlarged prostate with lower urinary tract symptoms: Secondary | ICD-10-CM | POA: Diagnosis not present

## 2021-07-26 DIAGNOSIS — R3121 Asymptomatic microscopic hematuria: Secondary | ICD-10-CM | POA: Diagnosis not present

## 2021-08-19 DIAGNOSIS — H2512 Age-related nuclear cataract, left eye: Secondary | ICD-10-CM | POA: Diagnosis not present

## 2021-09-13 ENCOUNTER — Telehealth: Payer: Self-pay | Admitting: Internal Medicine

## 2021-09-13 DIAGNOSIS — I10 Essential (primary) hypertension: Secondary | ICD-10-CM

## 2021-09-17 NOTE — Telephone Encounter (Signed)
1.Medication Requested: tamsulosin (FLOMAX) 0.4 MG CAPS capsule ? ?2. Pharmacy (Name, [place]): [facility], [place]  ?Phone:  [phone] ?Fax:  [phone] ? ? ?3. On Med List: yes ? ?4. Last Visit with PCP: 07.21.22 ? ?5. Next visit date with PCP: 04.03.23 ? ? ?Agent: Please be advised that RX refills may take up to 3 business days. We ask that you follow-up with your pharmacy.  ?

## 2021-09-20 ENCOUNTER — Other Ambulatory Visit: Payer: Self-pay | Admitting: Internal Medicine

## 2021-09-20 DIAGNOSIS — N401 Enlarged prostate with lower urinary tract symptoms: Secondary | ICD-10-CM

## 2021-09-20 MED ORDER — TAMSULOSIN HCL 0.4 MG PO CAPS: 0.4000 mg | ORAL_CAPSULE | Freq: Every day | ORAL | 0 refills | Status: AC

## 2021-10-04 ENCOUNTER — Ambulatory Visit (INDEPENDENT_AMBULATORY_CARE_PROVIDER_SITE_OTHER): Payer: Medicare Other | Admitting: Internal Medicine

## 2021-10-04 ENCOUNTER — Encounter: Payer: Self-pay | Admitting: Internal Medicine

## 2021-10-04 VITALS — BP 134/76 | HR 64 | Temp 97.8°F | Ht 68.0 in | Wt 217.0 lb

## 2021-10-04 DIAGNOSIS — N1832 Chronic kidney disease, stage 3b: Secondary | ICD-10-CM | POA: Diagnosis not present

## 2021-10-04 DIAGNOSIS — E785 Hyperlipidemia, unspecified: Secondary | ICD-10-CM | POA: Diagnosis not present

## 2021-10-04 DIAGNOSIS — R7303 Prediabetes: Secondary | ICD-10-CM

## 2021-10-04 DIAGNOSIS — I10 Essential (primary) hypertension: Secondary | ICD-10-CM

## 2021-10-04 LAB — CBC WITH DIFFERENTIAL/PLATELET
Basophils Absolute: 0.1 10*3/uL (ref 0.0–0.1)
Basophils Relative: 0.8 % (ref 0.0–3.0)
Eosinophils Absolute: 0.1 10*3/uL (ref 0.0–0.7)
Eosinophils Relative: 1.6 % (ref 0.0–5.0)
HCT: 39 % (ref 39.0–52.0)
Hemoglobin: 13.1 g/dL (ref 13.0–17.0)
Lymphocytes Relative: 35.6 % (ref 12.0–46.0)
Lymphs Abs: 2.1 10*3/uL (ref 0.7–4.0)
MCHC: 33.5 g/dL (ref 30.0–36.0)
MCV: 91.5 fl (ref 78.0–100.0)
Monocytes Absolute: 0.6 10*3/uL (ref 0.1–1.0)
Monocytes Relative: 9.7 % (ref 3.0–12.0)
Neutro Abs: 3.1 10*3/uL (ref 1.4–7.7)
Neutrophils Relative %: 52.3 % (ref 43.0–77.0)
Platelets: 243 10*3/uL (ref 150.0–400.0)
RBC: 4.26 Mil/uL (ref 4.22–5.81)
RDW: 13.4 % (ref 11.5–15.5)
WBC: 6 10*3/uL (ref 4.0–10.5)

## 2021-10-04 LAB — TSH: TSH: 2.41 u[IU]/mL (ref 0.35–5.50)

## 2021-10-04 LAB — HEMOGLOBIN A1C: Hgb A1c MFr Bld: 6 % (ref 4.6–6.5)

## 2021-10-04 NOTE — Progress Notes (Signed)
? ?Subjective:  ?Patient ID: Jonathan Medina, male    DOB: 01/03/1930  Age: 88 y.o. MRN: [id number] ? ?CC: Hypertension and Hyperlipidemia ? ? ?HPI ?Jonathan Medina presents for f/up - ? ?He is doing well. ? ?Outpatient Medications Prior to Visit  ?Medication Sig Dispense Refill  ? amLODipine (NORVASC) 5 MG tablet TAKE 1 TABLET DAILY 90 tablet 1  ? carvedilol (COREG CR) 20 MG 24 hr capsule TAKE 1 CAPSULE EVERY MORNING 90 capsule 1  ? oxyCODONE (OXY IR/ROXICODONE) 5 MG immediate release tablet Take 5 mg by mouth in the morning and at bedtime.    ? Oxycodone HCl 10 MG TABS Take 1 tablet (10 mg total) by mouth every 6 (six) hours as needed (breakthrough pain). 20 tablet 0  ? tamsulosin (FLOMAX) 0.4 MG CAPS capsule Take 1 capsule (0.4 mg total) by mouth daily after breakfast. 90 capsule 0  ? ?No facility-administered medications prior to visit.  ? ? ?ROS ?Review of Systems  ?Constitutional:  Negative for diaphoresis, fatigue and unexpected weight change.  ?HENT: Negative.    ?Eyes: Negative.   ?Respiratory:  Negative for cough, chest tightness, shortness of breath and wheezing.   ?Cardiovascular:  Negative for chest pain, palpitations and leg swelling.  ?Gastrointestinal:  Negative for abdominal pain, constipation, diarrhea, nausea and vomiting.  ?Endocrine: Negative.   ?Genitourinary: Negative.  Negative for difficulty urinating.  ?Musculoskeletal: Negative.  Negative for arthralgias.  ?Skin: Negative.   ?Neurological:  Negative for dizziness, weakness, light-headedness and headaches.  ?Hematological:  Negative for adenopathy. Does not bruise/bleed easily.  ?Psychiatric/Behavioral: Negative.    ? ?Objective:  ?BP 134/76 (BP Location: Right Arm, Patient Position: Sitting, Cuff Size: Large)   Pulse 64   Temp 97.8 ?F (36.6 ?C) (Oral)   Ht 5\' 8"  (1.727 m)   Wt 217 lb (98.4 kg)   SpO2 97%   BMI 32.99 kg/m?  ? ?BP Readings from Last 3 Encounters:  ?10/04/21 134/76  ?04/27/21 140/70  ?01/21/21 136/76  ? ? ?Wt Readings  from Last 3 Encounters:  ?10/04/21 217 lb (98.4 kg)  ?04/27/21 212 lb (96.2 kg)  ?01/21/21 208 lb (94.3 kg)  ? ? ?Physical Exam ?Vitals reviewed.  ?Constitutional:   ?   Appearance: He is obese. He is not ill-appearing.  ?HENT:  ?   Mouth/Throat:  ?   Mouth: Mucous membranes are moist.  ?Eyes:  ?   General: No scleral icterus. ?   Conjunctiva/sclera: Conjunctivae normal.  ?Cardiovascular:  ?   Rate and Rhythm: Normal rate and regular rhythm.  ?   Heart sounds: No murmur heard. ?Pulmonary:  ?   Effort: Pulmonary effort is normal.  ?   Breath sounds: No stridor. No wheezing, rhonchi or rales.  ?Abdominal:  ?   General: Abdomen is protuberant.  ?   Palpations: There is no mass.  ?   Tenderness: There is no abdominal tenderness. There is no guarding.  ?   Hernia: No hernia is present.  ?Musculoskeletal:     ?   General: Normal range of motion.  ?   Cervical back: Neck supple.  ?   Right lower leg: No edema.  ?   Left lower leg: No edema.  ?Lymphadenopathy:  ?   Cervical: No cervical adenopathy.  ?Skin: ?   General: Skin is warm and dry.  ?Neurological:  ?   General: No focal deficit present.  ?   Mental Status: He is alert.  ? ? ?Lab Results  ?Component  Value Date  ? WBC 6.0 10/04/2021  ? HGB 13.1 10/04/2021  ? HCT 39.0 10/04/2021  ? PLT 243.0 10/04/2021  ? GLUCOSE 83 10/04/2021  ? CHOL 205 (H) 10/04/2021  ? TRIG 195.0 (H) 10/04/2021  ? HDL 47.50 10/04/2021  ? LDLDIRECT 147.0 09/10/2019  ? LDLCALC 118 (H) 10/04/2021  ? ALT 14 10/04/2021  ? AST 17 10/04/2021  ? NA 138 10/04/2021  ? K 4.3 10/04/2021  ? CL 103 10/04/2021  ? CREATININE 1.12 10/04/2021  ? BUN 25 (H) 10/04/2021  ? CO2 29 10/04/2021  ? TSH 2.41 10/04/2021  ? PSA 0.54 07/17/2019  ? INR 0.9 08/07/2007  ? HGBA1C 6.0 10/04/2021  ? ? ?DG C-Arm 1-60 Min-No Report ? ?Result Date: 10/06/2020 ?Fluoroscopy was utilized by the requesting physician.  No radiographic interpretation.  ? ? ?Assessment & Plan:  ? ?Jefrey was seen today for hypertension and  hyperlipidemia. ? ?Diagnoses and all orders for this visit: ? ?Essential hypertension- His BP is well controlled ?-     Basic metabolic panel; Future ?-     CBC with Differential/Platelet; Future ?-     Hepatic function panel; Future ?-     Hepatic function panel ?-     CBC with Differential/Platelet ?-     Basic metabolic panel ? ?Stage 3b chronic kidney disease ([place])- Renal fxn is stable ?-     Basic metabolic panel; Future ?-     CBC with Differential/Platelet; Future ?-     CBC with Differential/Platelet ?-     Basic metabolic panel ? ?Hyperlipidemia with target LDL less than 130-  Statin tx is not indicated. ?-     Lipid panel; Future ?-     TSH; Future ?-     Hepatic function panel; Future ?-     Hepatic function panel ?-     TSH ?-     Lipid panel ? ?Prediabetes ?-     Hemoglobin A1c; Future ?-     Hemoglobin A1c ? ? ?I am having Jonathan Medina maintain his oxyCODONE, Oxycodone HCl, amLODipine, carvedilol, and tamsulosin. ? ?No orders of the defined types were placed in this encounter. ? ? ? ?Follow-up: No follow-ups on file. ? ?[name], MD ?

## 2021-10-05 ENCOUNTER — Encounter: Payer: Self-pay | Admitting: Internal Medicine

## 2021-10-05 LAB — LIPID PANEL
Cholesterol: 205 mg/dL — ABNORMAL HIGH (ref 0–200)
HDL: 47.5 mg/dL (ref >39.00)
LDL Cholesterol: 118 mg/dL — ABNORMAL HIGH (ref 0–99)
NonHDL: 157.4
Total CHOL/HDL Ratio: 4
Triglycerides: 195 mg/dL — ABNORMAL HIGH (ref 0.0–149.0)
VLDL: 39 mg/dL (ref 0.0–40.0)

## 2021-10-05 LAB — BASIC METABOLIC PANEL
BUN: 25 mg/dL — ABNORMAL HIGH (ref 6–23)
CO2: 29 mEq/L (ref 19–32)
Calcium: 9.4 mg/dL (ref 8.4–10.5)
Chloride: 103 mEq/L (ref 96–112)
Creatinine, Ser: 1.12 mg/dL (ref 0.40–1.50)
GFR: 58.11 mL/min — ABNORMAL LOW (ref >60.00)
Glucose, Bld: 83 mg/dL (ref 70–99)
Potassium: 4.3 mEq/L (ref 3.5–5.1)
Sodium: 138 mEq/L (ref 135–145)

## 2021-10-05 LAB — HEPATIC FUNCTION PANEL
ALT: 14 U/L (ref 0–53)
AST: 17 U/L (ref 0–37)
Albumin: 4.2 g/dL (ref 3.5–5.2)
Alkaline Phosphatase: 62 U/L (ref 39–117)
Bilirubin, Direct: 0.1 mg/dL (ref 0.0–0.3)
Total Bilirubin: 0.5 mg/dL (ref 0.2–1.2)
Total Protein: 6.9 g/dL (ref 6.0–8.3)

## 2021-10-17 DIAGNOSIS — H2511 Age-related nuclear cataract, right eye: Secondary | ICD-10-CM | POA: Diagnosis not present

## 2021-10-21 DIAGNOSIS — H2511 Age-related nuclear cataract, right eye: Secondary | ICD-10-CM | POA: Diagnosis not present

## 2021-10-21 DIAGNOSIS — H5703 Miosis: Secondary | ICD-10-CM | POA: Diagnosis not present

## 2021-10-25 DIAGNOSIS — N3011 Interstitial cystitis (chronic) with hematuria: Secondary | ICD-10-CM | POA: Diagnosis not present

## 2021-10-25 DIAGNOSIS — R351 Nocturia: Secondary | ICD-10-CM | POA: Diagnosis not present

## 2021-10-25 DIAGNOSIS — R3982 Chronic bladder pain: Secondary | ICD-10-CM | POA: Diagnosis not present

## 2021-10-25 DIAGNOSIS — N3942 Incontinence without sensory awareness: Secondary | ICD-10-CM | POA: Diagnosis not present

## 2021-10-25 DIAGNOSIS — N401 Enlarged prostate with lower urinary tract symptoms: Secondary | ICD-10-CM | POA: Diagnosis not present

## 2021-11-25 ENCOUNTER — Telehealth: Payer: Self-pay | Admitting: Internal Medicine

## 2021-11-25 NOTE — Telephone Encounter (Signed)
PT calls today in regards to a handicap placard form that was mailed into [place]. PT could not make it in himself to bring [place] the form but states he had mailed it in. I checked our incoming mail and Dr.Jones' mailbox but did not see anything in here. I had told him I would follow up with ya'll if it had been received back there.  PT would like it mailed back to him once finished!

## 2022-01-26 DIAGNOSIS — N3011 Interstitial cystitis (chronic) with hematuria: Secondary | ICD-10-CM | POA: Diagnosis not present

## 2022-01-26 DIAGNOSIS — N3942 Incontinence without sensory awareness: Secondary | ICD-10-CM | POA: Diagnosis not present

## 2022-02-22 ENCOUNTER — Telehealth: Payer: Self-pay | Admitting: *Deleted

## 2022-02-22 NOTE — Patient Outreach (Signed)
  Care Coordination   02/22/2022 Name: Jonathan Medina MRN: [id number] DOB: [birth year]   Care Coordination Outreach Attempts:  An unsuccessful telephone outreach was attempted today to offer the patient information about available care coordination services as a benefit of their health plan.   Follow Up Plan:  Additional outreach attempts will be made to offer the patient care coordination information and services.   Encounter Outcome:  No Answer  Care Coordination Interventions Activated:  No   Care Coordination Interventions:  No, not indicated    Emelia Loron RN, BSN [place] [phone] [email or web address]

## 2022-02-28 ENCOUNTER — Ambulatory Visit: Payer: Self-pay

## 2022-02-28 NOTE — Patient Outreach (Signed)
  Care Coordination   02/28/2022 Name: Jonathan Medina MRN: [id number] DOB: [birth year]   Care Coordination Outreach Attempts:  A second unsuccessful outreach was attempted today to offer the patient with information about available care coordination services as a benefit of their health plan.    HIPAA compliant message left with wife.  Follow Up Plan:  Additional outreach attempts will be made to offer the patient care coordination information and services.   Encounter Outcome:  No Answer  Care Coordination Interventions Activated:  No   Care Coordination Interventions:  No, not indicated    Thea Silversmith, RN, MSN, BSN, CCM Care Coordinator [phone]

## 2022-03-02 ENCOUNTER — Ambulatory Visit: Payer: Self-pay

## 2022-03-02 NOTE — Patient Outreach (Signed)
  Care Coordination   Initial Visit Note   03/02/2022 Name: Jonathan Medina MRN: [id number] DOB: Oct 15, 1929  Jonathan Medina is a 88 y.o. year old male who sees [name], MD for primary care. I spoke with  Jonathan Medina by phone today.  What matters to the patients health and wellness today?  Patient reports he is seen a the [employer] and wife takes care of his personal care needs. Has all his medications. He denies any care coordination or resource needs at this time.    Goals Addressed             This Visit's Progress    Care Coordination Activites-no follow up needs       Care Coordination Interventions: Advised patient to contact provider office for refills prior to running out Discussed annual wellness visit and encouraged to schedule Encouraged to contact RN Care Coordination and or primary care provider if needs change.         SDOH assessments and interventions completed:  Yes  SDOH Interventions Today    Flowsheet Row Most Recent Value  SDOH Interventions   Food Insecurity Interventions Intervention Not Indicated  Housing Interventions Intervention Not Indicated  Transportation Interventions Intervention Not Indicated        Care Coordination Interventions Activated:  Yes  Care Coordination Interventions:  Yes, provided   Follow up plan: No further intervention required.   Encounter Outcome:  Pt. Visit Completed   Thea Silversmith, RN, MSN, BSN, CCM Care Coordinator [phone]

## 2022-03-04 ENCOUNTER — Telehealth: Payer: Self-pay

## 2022-03-04 DIAGNOSIS — I10 Essential (primary) hypertension: Secondary | ICD-10-CM

## 2022-03-04 MED ORDER — CARVEDILOL PHOSPHATE ER 20 MG PO CP24: 20.0000 mg | ORAL_CAPSULE | Freq: Every morning | ORAL | 0 refills | Status: DC

## 2022-03-04 MED ORDER — AMLODIPINE BESYLATE 5 MG PO TABS: 5.0000 mg | ORAL_TABLET | Freq: Every day | ORAL | 0 refills | Status: DC

## 2022-03-04 NOTE — Telephone Encounter (Signed)
Pt is requesting refill on: amLODipine (NORVASC) 5 MG tablet carvedilol (COREG CR) 20 MG 24 hr capsule  Pharmacy: [place] [facility], [place] 10/04/21 ROV 04/12/22

## 2022-03-25 DIAGNOSIS — Z23 Encounter for immunization: Secondary | ICD-10-CM | POA: Diagnosis not present

## 2022-04-11 ENCOUNTER — Ambulatory Visit: Payer: Medicare Other | Admitting: Internal Medicine

## 2022-04-11 ENCOUNTER — Ambulatory Visit (INDEPENDENT_AMBULATORY_CARE_PROVIDER_SITE_OTHER): Payer: Medicare Other

## 2022-04-11 VITALS — Ht 68.0 in | Wt 217.0 lb

## 2022-04-11 DIAGNOSIS — Z0000 Encounter for general adult medical examination without abnormal findings: Secondary | ICD-10-CM | POA: Diagnosis not present
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
## 2022-04-11 NOTE — Patient Instructions (Signed)
Jonathan Medina , Thank you for taking time to come for your Medicare Wellness Visit. I appreciate your ongoing commitment to your health goals. Please review the following plan we discussed and let me know if I can assist you in the future.   These are the goals we discussed:  Goals      Care Coordination Activites-no follow up required     Care Coordination Interventions: Advised patient to contact provider office for refills prior to running out Discussed annual wellness visit and benefits; encouraged to schedule Encouraged to contact RN Care Coordination and or primary care provider if needs change.         This is a list of the screening recommended for you and due dates:  Health Maintenance  Topic Date Due   COVID-19 Vaccine (6 - Pfizer risk series) 05/18/2021   Flu Shot  02/01/2022   Tetanus Vaccine  04/16/2024   Pneumonia Vaccine  Completed   Zoster (Shingles) Vaccine  Completed   HPV Vaccine  Aged Out    Advanced directives: Please bring a copy of your health care power of attorney and living will to the office to be added to your chart at your convenience.   Conditions/risks identified: Aim for 30 minutes of exercise or brisk walking, 6-8 glasses of water, and 5 servings of fruits and vegetables each day.   Next appointment: Follow up in one year for your annual wellness visit.   Preventive Care 49 Years and Older, Male  Preventive care refers to lifestyle choices and visits with your health care provider that can promote health and wellness. What does preventive care include? A yearly physical exam. This is also called an annual well check. Dental exams once or twice a year. Routine eye exams. Ask your health care provider how often you should have your eyes checked. Personal lifestyle choices, including: Daily care of your teeth and gums. Regular physical activity. Eating a healthy diet. Avoiding tobacco and drug use. Limiting alcohol use. Practicing safe  sex. Taking low doses of aspirin every day. Taking vitamin and mineral supplements as recommended by your health care provider. What happens during an annual well check? The services and screenings done by your health care provider during your annual well check will depend on your age, overall health, lifestyle risk factors, and family history of disease. Counseling  Your health care provider may ask you questions about your: Alcohol use. Tobacco use. Drug use. Emotional well-being. Home and relationship well-being. Sexual activity. Eating habits. History of falls. Memory and ability to understand (cognition). Work and work Statistician. Screening  You may have the following tests or measurements: Height, weight, and BMI. Blood pressure. Lipid and cholesterol levels. These may be checked every 5 years, or more frequently if you are over 67 years old. Skin check. Lung cancer screening. You may have this screening every year starting at age 13 if you have a 30-pack-year history of smoking and currently smoke or have quit within the past 15 years. Fecal occult blood test (FOBT) of the stool. You may have this test every year starting at age 65. Flexible sigmoidoscopy or colonoscopy. You may have a sigmoidoscopy every 5 years or a colonoscopy every 10 years starting at age 14. Prostate cancer screening. Recommendations will vary depending on your family history and other risks. Hepatitis C blood test. Hepatitis B blood test. Sexually transmitted disease (STD) testing. Diabetes screening. This is done by checking your blood sugar (glucose) after you have not eaten for  a while (fasting). You may have this done every 1-3 years. Abdominal aortic aneurysm (AAA) screening. You may need this if you are a current or former smoker. Osteoporosis. You may be screened starting at age 21 if you are at high risk. Talk with your health care provider about your test results, treatment options, and if  necessary, the need for more tests. Vaccines  Your health care provider may recommend certain vaccines, such as: Influenza vaccine. This is recommended every year. Tetanus, diphtheria, and acellular pertussis (Tdap, Td) vaccine. You may need a Td booster every 10 years. Zoster vaccine. You may need this after age 61. Pneumococcal 13-valent conjugate (PCV13) vaccine. One dose is recommended after age 32. Pneumococcal polysaccharide (PPSV23) vaccine. One dose is recommended after age 45. Talk to your health care provider about which screenings and vaccines you need and how often you need them. This information is not intended to replace advice given to you by your health care provider. Make sure you discuss any questions you have with your health care provider. Document Released: 07/17/2015 Document Revised: 03/09/2016 Document Reviewed: 04/21/2015 Elsevier Interactive Patient Education  2017 [place] Prevention in the Home Falls can cause injuries. They can happen to people of all ages. There are many things you can do to make your home safe and to help prevent falls. What can I do on the outside of my home? Regularly fix the edges of walkways and driveways and fix any cracks. Remove anything that might make you trip as you walk through a door, such as a raised step or threshold. Trim any bushes or trees on the path to your home. Use bright outdoor lighting. Clear any walking paths of anything that might make someone trip, such as rocks or tools. Regularly check to see if handrails are loose or broken. Make sure that both sides of any steps have handrails. Any raised decks and porches should have guardrails on the edges. Have any leaves, snow, or ice cleared regularly. Use sand or salt on walking paths during winter. Clean up any spills in your garage right away. This includes oil or grease spills. What can I do in the bathroom? Use night lights. Install grab bars by the toilet  and in the tub and shower. Do not use towel bars as grab bars. Use non-skid mats or decals in the tub or shower. If you need to sit down in the shower, use a plastic, non-slip stool. Keep the floor dry. Clean up any water that spills on the floor as soon as it happens. Remove soap buildup in the tub or shower regularly. Attach bath mats securely with double-sided non-slip rug tape. Do not have throw rugs and other things on the floor that can make you trip. What can I do in the bedroom? Use night lights. Make sure that you have a light by your bed that is easy to reach. Do not use any sheets or blankets that are too big for your bed. They should not hang down onto the floor. Have a firm chair that has side arms. You can use this for support while you get dressed. Do not have throw rugs and other things on the floor that can make you trip. What can I do in the kitchen? Clean up any spills right away. Avoid walking on wet floors. Keep items that you use a lot in easy-to-reach places. If you need to reach something above you, use a strong step stool that has a  grab bar. Keep electrical cords out of the way. Do not use floor polish or wax that makes floors slippery. If you must use wax, use non-skid floor wax. Do not have throw rugs and other things on the floor that can make you trip. What can I do with my stairs? Do not leave any items on the stairs. Make sure that there are handrails on both sides of the stairs and use them. Fix handrails that are broken or loose. Make sure that handrails are as long as the stairways. Check any carpeting to make sure that it is firmly attached to the stairs. Fix any carpet that is loose or worn. Avoid having throw rugs at the top or bottom of the stairs. If you do have throw rugs, attach them to the floor with carpet tape. Make sure that you have a light switch at the top of the stairs and the bottom of the stairs. If you do not have them, ask someone to add  them for you. What else can I do to help prevent falls? Wear shoes that: Do not have high heels. Have rubber bottoms. Are comfortable and fit you well. Are closed at the toe. Do not wear sandals. If you use a stepladder: Make sure that it is fully opened. Do not climb a closed stepladder. Make sure that both sides of the stepladder are locked into place. Ask someone to hold it for you, if possible. Clearly mark and make sure that you can see: Any grab bars or handrails. First and last steps. Where the edge of each step is. Use tools that help you move around (mobility aids) if they are needed. These include: Canes. Walkers. Scooters. Crutches. Turn on the lights when you go into a dark area. Replace any light bulbs as soon as they burn out. Set up your furniture so you have a clear path. Avoid moving your furniture around. If any of your floors are uneven, fix them. If there are any pets around you, be aware of where they are. Review your medicines with your doctor. Some medicines can make you feel dizzy. This can increase your chance of falling. Ask your doctor what other things that you can do to help prevent falls. This information is not intended to replace advice given to you by your health care provider. Make sure you discuss any questions you have with your health care provider. Document Released: 04/16/2009 Document Revised: 11/26/2015 Document Reviewed: 07/25/2014 Elsevier Interactive Patient Education  2017 [employer].

## 2022-04-12 ENCOUNTER — Encounter: Payer: Self-pay | Admitting: Internal Medicine

## 2022-04-12 ENCOUNTER — Ambulatory Visit (INDEPENDENT_AMBULATORY_CARE_PROVIDER_SITE_OTHER): Payer: Medicare Other | Admitting: Internal Medicine

## 2022-04-12 ENCOUNTER — Telehealth: Payer: Self-pay

## 2022-04-12 VITALS — BP 136/88 | HR 66 | Temp 97.7°F | Ht 68.0 in | Wt 221.0 lb

## 2022-04-12 DIAGNOSIS — N1832 Chronic kidney disease, stage 3b: Secondary | ICD-10-CM | POA: Diagnosis not present

## 2022-04-12 DIAGNOSIS — G4733 Obstructive sleep apnea (adult) (pediatric): Secondary | ICD-10-CM | POA: Diagnosis not present

## 2022-04-12 DIAGNOSIS — I10 Essential (primary) hypertension: Secondary | ICD-10-CM | POA: Diagnosis not present

## 2022-04-12 LAB — CBC WITH DIFFERENTIAL/PLATELET
Basophils Absolute: 0 10*3/uL (ref 0.0–0.1)
Basophils Relative: 0.5 % (ref 0.0–3.0)
Eosinophils Absolute: 0.1 10*3/uL (ref 0.0–0.7)
Eosinophils Relative: 1.9 % (ref 0.0–5.0)
HCT: 40.1 % (ref 39.0–52.0)
Hemoglobin: 13.2 g/dL (ref 13.0–17.0)
Lymphocytes Relative: 36.3 % (ref 12.0–46.0)
Lymphs Abs: 2.2 10*3/uL (ref 0.7–4.0)
MCHC: 32.9 g/dL (ref 30.0–36.0)
MCV: 91.6 fl (ref 78.0–100.0)
Monocytes Absolute: 0.6 10*3/uL (ref 0.1–1.0)
Monocytes Relative: 9.8 % (ref 3.0–12.0)
Neutro Abs: 3.2 10*3/uL (ref 1.4–7.7)
Neutrophils Relative %: 51.5 % (ref 43.0–77.0)
Platelets: 235 10*3/uL (ref 150.0–400.0)
RBC: 4.38 Mil/uL (ref 4.22–5.81)
RDW: 13.6 % (ref 11.5–15.5)
WBC: 6.2 10*3/uL (ref 4.0–10.5)

## 2022-04-12 LAB — BASIC METABOLIC PANEL
BUN: 25 mg/dL — ABNORMAL HIGH (ref 6–23)
CO2: 30 mEq/L (ref 19–32)
Calcium: 9.5 mg/dL (ref 8.4–10.5)
Chloride: 101 mEq/L (ref 96–112)
Creatinine, Ser: 1.16 mg/dL (ref 0.40–1.50)
GFR: 55.51 mL/min — ABNORMAL LOW (ref >60.00)
Glucose, Bld: 73 mg/dL (ref 70–99)
Potassium: 4.1 mEq/L (ref 3.5–5.1)
Sodium: 138 mEq/L (ref 135–145)

## 2022-04-12 MED ORDER — AMLODIPINE BESYLATE 5 MG PO TABS: 5.0000 mg | ORAL_TABLET | Freq: Every day | ORAL | 1 refills | Status: DC

## 2022-04-12 MED ORDER — CARVEDILOL PHOSPHATE ER 20 MG PO CP24: 20.0000 mg | ORAL_CAPSULE | Freq: Every morning | ORAL | 1 refills | Status: DC

## 2022-04-12 NOTE — Telephone Encounter (Signed)
Patient called back in to inform Dr.Jones that he sees pulmonologist, Dr.Tammy Porrot [phone]

## 2022-04-12 NOTE — Patient Instructions (Signed)
Hypertension, Adult High blood pressure (hypertension) is when the force of blood pumping through the arteries is too strong. The arteries are the blood vessels that carry blood from the heart throughout the body. Hypertension forces the heart to work harder to pump blood and may cause arteries to become narrow or stiff. Untreated or uncontrolled hypertension can lead to a heart attack, heart failure, a stroke, kidney disease, and other problems. A blood pressure reading consists of a higher number over a lower number. Ideally, your blood pressure should be below 120/80. The first ("top") number is called the systolic pressure. It is a measure of the pressure in your arteries as your heart beats. The second ("bottom") number is called the diastolic pressure. It is a measure of the pressure in your arteries as the heart relaxes. What are the causes? The exact cause of this condition is not known. There are some conditions that result in high blood pressure. What increases the risk? Certain factors may make you more likely to develop high blood pressure. Some of these risk factors are under your control, including: Smoking. Not getting enough exercise or physical activity. Being overweight. Having too much fat, sugar, calories, or salt (sodium) in your diet. Drinking too much alcohol. Other risk factors include: Having a personal history of heart disease, diabetes, high cholesterol, or kidney disease. Stress. Having a family history of high blood pressure and high cholesterol. Having obstructive sleep apnea. Age. The risk increases with age. What are the signs or symptoms? High blood pressure may not cause symptoms. Very high blood pressure (hypertensive crisis) may cause: Headache. Fast or irregular heartbeats (palpitations). Shortness of breath. Nosebleed. Nausea and vomiting. Vision changes. Severe chest pain, dizziness, and seizures. How is this diagnosed? This condition is diagnosed by  measuring your blood pressure while you are seated, with your arm resting on a flat surface, your legs uncrossed, and your feet flat on the floor. The cuff of the blood pressure monitor will be placed directly against the skin of your upper arm at the level of your heart. Blood pressure should be measured at least twice using the same arm. Certain conditions can cause a difference in blood pressure between your right and left arms. If you have a high blood pressure reading during one visit or you have normal blood pressure with other risk factors, you may be asked to: Return on a different day to have your blood pressure checked again. Monitor your blood pressure at home for 1 week or longer. If you are diagnosed with hypertension, you may have other blood or imaging tests to help your health care provider understand your overall risk for other conditions. How is this treated? This condition is treated by making healthy lifestyle changes, such as eating healthy foods, exercising more, and reducing your alcohol intake. You may be referred for counseling on a healthy diet and physical activity. Your health care provider may prescribe medicine if lifestyle changes are not enough to get your blood pressure under control and if: Your systolic blood pressure is above 130. Your diastolic blood pressure is above 80. Your personal target blood pressure may vary depending on your medical conditions, your age, and other factors. Follow these instructions at home: Eating and drinking  Eat a diet that is high in fiber and potassium, and low in sodium, added sugar, and fat. An example of this eating plan is called the DASH diet. DASH stands for Dietary Approaches to Stop Hypertension. To eat this way: Eat   plenty of fresh fruits and vegetables. Try to fill one half of your plate at each meal with fruits and vegetables. Eat whole grains, such as whole-wheat pasta, brown rice, or whole-grain bread. Fill about one  fourth of your plate with whole grains. Eat or drink low-fat dairy products, such as skim milk or low-fat yogurt. Avoid fatty cuts of meat, processed or cured meats, and poultry with skin. Fill about one fourth of your plate with lean proteins, such as fish, chicken without skin, beans, eggs, or tofu. Avoid pre-made and processed foods. These tend to be higher in sodium, added sugar, and fat. Reduce your daily sodium intake. Many people with hypertension should eat less than 1,500 mg of sodium a day. Do not drink alcohol if: Your health care provider tells you not to drink. You are pregnant, may be pregnant, or are planning to become pregnant. If you drink alcohol: Limit how much you have to: 0-1 drink a day for women. 0-2 drinks a day for men. Know how much alcohol is in your drink. In the [place] one drink equals one 12 oz bottle of beer (355 mL), one 5 oz glass of wine (148 mL), or one 1 oz glass of hard liquor (44 mL). Lifestyle  Work with your health care provider to maintain a healthy body weight or to lose weight. Ask what an ideal weight is for you. Get at least 30 minutes of exercise that causes your heart to beat faster (aerobic exercise) most days of the week. Activities may include walking, swimming, or biking. Include exercise to strengthen your muscles (resistance exercise), such as Pilates or lifting weights, as part of your weekly exercise routine. Try to do these types of exercises for 30 minutes at least 3 days a week. Do not use any products that contain nicotine or tobacco. These products include cigarettes, chewing tobacco, and vaping devices, such as e-cigarettes. If you need help quitting, ask your health care provider. Monitor your blood pressure at home as told by your health care provider. Keep all follow-up visits. This is important. Medicines Take over-the-counter and prescription medicines only as told by your health care provider. Follow directions carefully. Blood  pressure medicines must be taken as prescribed. Do not skip doses of blood pressure medicine. Doing this puts you at risk for problems and can make the medicine less effective. Ask your health care provider about side effects or reactions to medicines that you should watch for. Contact a health care provider if you: Think you are having a reaction to a medicine you are taking. Have headaches that keep coming back (recurring). Feel dizzy. Have swelling in your ankles. Have trouble with your vision. Get help right away if you: Develop a severe headache or confusion. Have unusual weakness or numbness. Feel faint. Have severe pain in your chest or abdomen. Vomit repeatedly. Have trouble breathing. These symptoms may be an emergency. Get help right away. Call 911. Do not wait to see if the symptoms will go away. Do not drive yourself to the hospital. Summary Hypertension is when the force of blood pumping through your arteries is too strong. If this condition is not controlled, it may put you at risk for serious complications. Your personal target blood pressure may vary depending on your medical conditions, your age, and other factors. For most people, a normal blood pressure is less than 120/80. Hypertension is treated with lifestyle changes, medicines, or a combination of both. Lifestyle changes include losing weight, eating a healthy,   low-sodium diet, exercising more, and limiting alcohol. This information is not intended to replace advice given to you by your health care provider. Make sure you discuss any questions you have with your health care provider. Document Revised: 04/27/2021 Document Reviewed: 04/27/2021 Elsevier Patient Education  2023 Elsevier Inc.  

## 2022-04-12 NOTE — Progress Notes (Unsigned)
Subjective:  Patient ID: Jonathan Medina, male    DOB: [birth year]  Age: 88 y.o. MRN: [id number]  CC: No chief complaint on file.   HPI TRENTAN TRIPPE presents for ***  Outpatient Medications Prior to Visit  Medication Sig Dispense Refill   oxyCODONE (OXY IR/ROXICODONE) 5 MG immediate release tablet Take 5 mg by mouth in the morning and at bedtime.     Oxycodone HCl 10 MG TABS Take 1 tablet (10 mg total) by mouth every 6 (six) hours as needed (breakthrough pain). 20 tablet 0   tamsulosin (FLOMAX) 0.4 MG CAPS capsule Take 1 capsule (0.4 mg total) by mouth daily after breakfast. 90 capsule 0   amLODipine (NORVASC) 5 MG tablet Take 1 tablet (5 mg total) by mouth daily. 90 tablet 0   carvedilol (COREG CR) 20 MG 24 hr capsule Take 1 capsule (20 mg total) by mouth every morning. 90 capsule 0   No facility-administered medications prior to visit.    ROS Review of Systems  Respiratory:  Positive for apnea, cough and choking. Negative for shortness of breath and wheezing.     Objective:  BP 136/88 (BP Location: Right Arm, Patient Position: Sitting, Cuff Size: Large)   Pulse 66   Temp 97.7 F (36.5 C) (Oral)   Ht 5\' 8"  (1.727 m)   Wt 221 lb (100.2 kg)   SpO2 97%   BMI 33.60 kg/m   BP Readings from Last 3 Encounters:  04/12/22 136/88  10/04/21 134/76  04/27/21 140/70    Wt Readings from Last 3 Encounters:  04/12/22 221 lb (100.2 kg)  04/11/22 217 lb (98.4 kg)  10/04/21 217 lb (98.4 kg)    Physical Exam  Lab Results  Component Value Date   WBC 6.0 10/04/2021   HGB 13.1 10/04/2021   HCT 39.0 10/04/2021   PLT 243.0 10/04/2021   GLUCOSE 83 10/04/2021   CHOL 205 (H) 10/04/2021   TRIG 195.0 (H) 10/04/2021   HDL 47.50 10/04/2021   LDLDIRECT 147.0 09/10/2019   LDLCALC 118 (H) 10/04/2021   ALT 14 10/04/2021   AST 17 10/04/2021   NA 138 10/04/2021   K 4.3 10/04/2021   CL 103 10/04/2021   CREATININE 1.12 10/04/2021   BUN 25 (H) 10/04/2021   CO2 29 10/04/2021   TSH  2.41 10/04/2021   PSA 0.54 07/17/2019   INR 0.9 08/07/2007   HGBA1C 6.0 10/04/2021    DG C-Arm 1-60 Min-No Report  Result Date: 10/06/2020 Fluoroscopy was utilized by the requesting physician.  No radiographic interpretation.    Assessment & Plan:   Diagnoses and all orders for this visit:  Stage 3b chronic kidney disease ([place]) -     Basic metabolic panel; Future -     CBC with Differential/Platelet; Future  Essential hypertension -     amLODipine (NORVASC) 5 MG tablet; Take 1 tablet (5 mg total) by mouth daily. -     carvedilol (COREG CR) 20 MG 24 hr capsule; Take 1 capsule (20 mg total) by mouth every morning. -     Basic metabolic panel; Future -     CBC with Differential/Platelet; Future  OSA on CPAP -     Ambulatory referral to Pulmonology   I am having Kyandre L. Losey maintain his oxyCODONE, Oxycodone HCl, tamsulosin, amLODipine, and carvedilol.  Meds ordered this encounter  Medications   amLODipine (NORVASC) 5 MG tablet    Sig: Take 1 tablet (5 mg total) by mouth daily.  Dispense:  90 tablet    Refill:  1   carvedilol (COREG CR) 20 MG 24 hr capsule    Sig: Take 1 capsule (20 mg total) by mouth every morning.    Dispense:  90 capsule    Refill:  1     Follow-up: Return in about 6 months (around 10/12/2022).  [name], MD

## 2022-04-29 ENCOUNTER — Ambulatory Visit (INDEPENDENT_AMBULATORY_CARE_PROVIDER_SITE_OTHER): Payer: Medicare Other | Admitting: Adult Health

## 2022-04-29 ENCOUNTER — Encounter: Payer: Self-pay | Admitting: Adult Health

## 2022-04-29 ENCOUNTER — Ambulatory Visit (INDEPENDENT_AMBULATORY_CARE_PROVIDER_SITE_OTHER): Payer: Medicare Other

## 2022-04-29 VITALS — BP 144/82 | HR 73 | Temp 99.0°F | Ht 68.0 in | Wt 223.4 lb

## 2022-04-29 DIAGNOSIS — J40 Bronchitis, not specified as acute or chronic: Secondary | ICD-10-CM | POA: Diagnosis not present

## 2022-04-29 DIAGNOSIS — G4733 Obstructive sleep apnea (adult) (pediatric): Secondary | ICD-10-CM

## 2022-04-29 DIAGNOSIS — J209 Acute bronchitis, unspecified: Secondary | ICD-10-CM

## 2022-04-29 DIAGNOSIS — R053 Chronic cough: Secondary | ICD-10-CM | POA: Insufficient documentation

## 2022-04-29 DIAGNOSIS — K219 Gastro-esophageal reflux disease without esophagitis: Secondary | ICD-10-CM | POA: Diagnosis not present

## 2022-04-29 MED ORDER — AZITHROMYCIN 250 MG PO TABS: ORAL_TABLET | ORAL | 0 refills | Status: AC

## 2022-04-29 NOTE — Assessment & Plan Note (Signed)
Acute bronchitis check chest x-ray. We will treat with Z-Pak.  May use Mucinex as needed  Plan  Patient Instructions  Zpack take as directed . Take with food Liquid Mucinex DM Twice daily  As needed  cough/congestion  Try Prilosec 20mg  daily for 2 weeks and then daily as needed.  Chest xray today .  Please contact office for sooner follow up if symptoms do not improve or worsen or seek emergency care    Continue on CPAP At bedtime   Saline nasal rinses Twice daily  As needed   Keep up good work .  Do not drive if sleepy.  Work on healthy weight.  Follow up Dr. Elsworth Soho  or Camren Henthorn NP In 1 year  And As needed

## 2022-04-29 NOTE — Assessment & Plan Note (Signed)
GERd diet PPI   Plan Patient Instructions  Zpack take as directed . Take with food Liquid Mucinex DM Twice daily  As needed  cough/congestion  Try Prilosec 20mg  daily for 2 weeks and then daily as needed.  Chest xray today .  Please contact office for sooner follow up if symptoms do not improve or worsen or seek emergency care    Continue on CPAP At bedtime   Saline nasal rinses Twice daily  As needed   Keep up good work .  Do not drive if sleepy.  Work on healthy weight.  Follow up Dr. Elsworth Soho  or Staphany Ditton NP In 1 year  And As needed
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
## 2022-04-29 NOTE — Patient Instructions (Addendum)
Zpack take as directed . Take with food Liquid Mucinex DM Twice daily  As needed  cough/congestion  Try Prilosec 20mg  daily for 2 weeks and then daily as needed.  Chest xray today .  Please contact office for sooner follow up if symptoms do not improve or worsen or seek emergency care    Continue on CPAP At bedtime   Saline nasal rinses Twice daily  As needed   Keep up good work .  Do not drive if sleepy.  Work on healthy weight.  Follow up Dr. Elsworth Soho  or Myrtha Tonkovich NP In 1 year  And As needed

## 2022-04-29 NOTE — Assessment & Plan Note (Signed)
Excellent control compliance on nocturnal CPAP  Plan  Patient Instructions  Zpack take as directed . Take with food Liquid Mucinex DM Twice daily  As needed  cough/congestion  Try Prilosec 20mg  daily for 2 weeks and then daily as needed.  Chest xray today .  Please contact office for sooner follow up if symptoms do not improve or worsen or seek emergency care    Continue on CPAP At bedtime   Saline nasal rinses Twice daily  As needed   Keep up good work .  Do not drive if sleepy.  Work on healthy weight.  Follow up Dr. Elsworth Soho  or Gianne Shugars NP In 1 year  And As needed

## 2022-05-02 ENCOUNTER — Telehealth: Payer: Self-pay | Admitting: Adult Health

## 2022-05-02 DIAGNOSIS — J209 Acute bronchitis, unspecified: Secondary | ICD-10-CM

## 2022-05-02 DIAGNOSIS — J40 Bronchitis, not specified as acute or chronic: Secondary | ICD-10-CM

## 2022-05-02 NOTE — Telephone Encounter (Signed)
It has not come through yet, I will check on and call him tomorrow

## 2022-05-02 NOTE — Telephone Encounter (Signed)
Called patient this morning and clarified with him that he was wanting the chest xray results from last week.   Please advise tammy

## 2022-05-03 NOTE — Telephone Encounter (Signed)
Chest x-ray shows bronchitic changes with bilateral interstitial thickening- No sign of pneumonia.  Continue with office visit recommendations. We can set up a CT chest high-resolution to further evaluate and make sure that this is not pulmonary fibrosis if he is not feeling any better and cough is continuing.

## 2022-05-03 NOTE — Telephone Encounter (Signed)
Patient called back to go over CT results. [phone] please call and advise

## 2022-05-03 NOTE — Telephone Encounter (Signed)
Called patient but he did not answer and I could not leave a VM.

## 2022-05-03 NOTE — Telephone Encounter (Signed)
ATC patient.  No answer.  VM full.

## 2022-05-04 NOTE — Telephone Encounter (Signed)
ATC (returning patient's call).  No answer.  VM full.

## 2022-05-04 NOTE — Telephone Encounter (Signed)
Called and spoke with patient, provided results/recommendations per Tammy Parrett NP.  He verbalized understanding.  Nothing further needed.

## 2022-05-24 DIAGNOSIS — Z961 Presence of intraocular lens: Secondary | ICD-10-CM | POA: Diagnosis not present

## 2022-05-24 DIAGNOSIS — H02831 Dermatochalasis of right upper eyelid: Secondary | ICD-10-CM | POA: Diagnosis not present

## 2022-05-24 DIAGNOSIS — H04123 Dry eye syndrome of bilateral lacrimal glands: Secondary | ICD-10-CM | POA: Diagnosis not present

## 2022-05-24 DIAGNOSIS — H02834 Dermatochalasis of left upper eyelid: Secondary | ICD-10-CM | POA: Diagnosis not present

## 2022-06-01 DIAGNOSIS — R351 Nocturia: Secondary | ICD-10-CM | POA: Diagnosis not present

## 2022-06-01 DIAGNOSIS — N401 Enlarged prostate with lower urinary tract symptoms: Secondary | ICD-10-CM | POA: Diagnosis not present

## 2022-06-01 DIAGNOSIS — N3941 Urge incontinence: Secondary | ICD-10-CM | POA: Diagnosis not present

## 2022-06-01 DIAGNOSIS — N3011 Interstitial cystitis (chronic) with hematuria: Secondary | ICD-10-CM | POA: Diagnosis not present

## 2022-06-08 ENCOUNTER — Ambulatory Visit
Admission: RE | Admit: 2022-06-08 | Discharge: 2022-06-08 | Disposition: A | Discharge status: Home / Self Care | Payer: Medicare Other | Source: Ambulatory Visit | Attending: Adult Health | Admitting: Adult Health

## 2022-06-08 DIAGNOSIS — J439 Emphysema, unspecified: Secondary | ICD-10-CM | POA: Diagnosis not present

## 2022-06-08 DIAGNOSIS — I700 Atherosclerosis of aorta: Secondary | ICD-10-CM | POA: Diagnosis not present

## 2022-06-08 DIAGNOSIS — J40 Bronchitis, not specified as acute or chronic: Secondary | ICD-10-CM

## 2022-06-08 DIAGNOSIS — J84112 Idiopathic pulmonary fibrosis: Secondary | ICD-10-CM | POA: Diagnosis not present

## 2022-06-08 DIAGNOSIS — J209 Acute bronchitis, unspecified: Secondary | ICD-10-CM

## 2022-06-08 DIAGNOSIS — J479 Bronchiectasis, uncomplicated: Secondary | ICD-10-CM | POA: Diagnosis not present

## 2022-06-10 ENCOUNTER — Telehealth: Payer: Self-pay | Admitting: Adult Health

## 2022-06-10 NOTE — Telephone Encounter (Signed)
See results

## 2022-06-10 NOTE — Telephone Encounter (Signed)
Melvenia Needles, NP 06/10/2022  3:53 PM EST     CT chest shows postsurgical scarring at the right upper lobe wedge resection stable, moderate emphysema subpleural and lower lobe reticular opacities with traction bronchiectasis. -This appears to be consistent with fibrosis probable UIP.  Stable pulmonary nodules since 2012.  Small left lower lobe nodule measuring 4 mm. Incidental findings show unchanged right thyroid nodule since 2012.   Lets set up office visit with Dr. Elsworth Soho or myself to discuss CT results in detail and check to see how he is feeling since last visit in October    Called and spoke with pt letting him know the results of CT and he verbalized understanding. Appt scheduled for pt with TP to have results discussed in detail. Nothing further needed.

## 2022-06-10 NOTE — Telephone Encounter (Signed)
Called and spoke to patient and he states that he would like the results of his CT scan that was done 06/08/22. I advised him that I would message Tammy to get those results if they are ready.   Tammy please advise   Thank you

## 2022-07-08 ENCOUNTER — Encounter: Payer: Self-pay | Admitting: Adult Health

## 2022-07-08 ENCOUNTER — Ambulatory Visit (INDEPENDENT_AMBULATORY_CARE_PROVIDER_SITE_OTHER): Payer: Medicare Other | Admitting: Adult Health

## 2022-07-08 VITALS — BP 130/80 | HR 61 | Temp 98.3°F | Ht 68.0 in | Wt 226.0 lb

## 2022-07-08 DIAGNOSIS — R911 Solitary pulmonary nodule: Secondary | ICD-10-CM

## 2022-07-08 DIAGNOSIS — J849 Interstitial pulmonary disease, unspecified: Secondary | ICD-10-CM

## 2022-07-08 DIAGNOSIS — G4733 Obstructive sleep apnea (adult) (pediatric): Secondary | ICD-10-CM

## 2022-07-08 NOTE — Assessment & Plan Note (Signed)
Excellent control compliance on nocturnal CPAP.  Encouraged on daily usage for greater than 6 hours. CPAP care discussed  Plan  Patient Instructions  Continue on CPAP At bedtime   Saline nasal rinses Twice daily  As needed   Saline nasal gel  Keep up good work .  Do not drive if sleepy.  Work on healthy weight.  Robitussin DM 1 tsp every 4hr as needed  Activity as tolerated  Follow up Dr. Elsworth Soho  in 4 months and As needed
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
## 2022-07-08 NOTE — Patient Instructions (Addendum)
Continue on CPAP At bedtime   Saline nasal rinses Twice daily  As needed   Saline nasal gel  Keep up good work .  Do not drive if sleepy.  Work on healthy weight.  Robitussin DM 1 tsp every 4hr as needed  Activity as tolerated  Follow up Dr. Elsworth Soho  in 4 months and As needed

## 2022-07-08 NOTE — Assessment & Plan Note (Signed)
High-resolution CT chest December 2023 showing interstitial lung disease changes with probable UIP.  Long discussion with patient and family members regarding CT results.  CT imaging was showed to patient and family member.  ILD/fibrosis was explained in detail along with potential for progression with IPF.  Patient does have daily symptoms of cough and shortness of breath.  We discussed workup which would involve lab testing for autoimmune/connective tissue, pulmonary function testing and serial CT chest.  We also discussed antifibrotic therapy with Ofev and Esbriet.  Went over side effect profile.  Patient's main concern is daily cough and congestion.  At this time he does not wish to proceed with lab work or pulmonary function testing.  Would recommend a follow-up CT chest in 1 year to monitor progression and  CT showed a very small 4 mm left lower lobe nodule.  For now we will continue with a conservative approach.  Will have patient follow back up in 4 months to evaluate clinical symptoms.  Plan Patient Instructions  Continue on CPAP At bedtime   Saline nasal rinses Twice daily  As needed   Saline nasal gel  Keep up good work .  Do not drive if sleepy.  Work on healthy weight.  Robitussin DM 1 tsp every 4hr as needed  Activity as tolerated  Follow up Dr. Elsworth Soho  in 4 months and As needed

## 2022-07-08 NOTE — Assessment & Plan Note (Signed)
CT chest showed stable lung nodules with a new left lower lobe very small 4 mm lung nodule.  Will plan for CT chest in 1 year.

## 2022-07-11 ENCOUNTER — Telehealth: Payer: Self-pay | Admitting: Adult Health

## 2022-07-11 NOTE — Telephone Encounter (Signed)
Called and spoke with pt's spouse who states that the robitussin is helping but they want to know how long pt should take it. Stated to her that TP stated that pt could take it every 4 hours as needed and she verbalized understanding. Stated to her if pt was not having any problems with the cough that he didn't need to take it but if he began having problems that he could begin to take it again. Nothing further needed.

## 2022-07-11 NOTE — Telephone Encounter (Signed)
PT saw Ms. Parrett recently and she told PT to take Robitusin. He wonders how long he should stay on it. It is working, he said, but still has issues.

## 2022-08-23 DIAGNOSIS — Z8546 Personal history of malignant neoplasm of prostate: Secondary | ICD-10-CM | POA: Diagnosis not present

## 2022-08-24 ENCOUNTER — Encounter (HOSPITAL_BASED_OUTPATIENT_CLINIC_OR_DEPARTMENT_OTHER): Payer: Self-pay | Admitting: Pulmonary Disease

## 2022-08-24 ENCOUNTER — Ambulatory Visit (INDEPENDENT_AMBULATORY_CARE_PROVIDER_SITE_OTHER): Payer: Medicare Other | Admitting: Pulmonary Disease

## 2022-08-24 VITALS — BP 142/80 | HR 71 | Ht 68.0 in | Wt 220.0 lb

## 2022-08-24 DIAGNOSIS — J849 Interstitial pulmonary disease, unspecified: Secondary | ICD-10-CM

## 2022-08-24 DIAGNOSIS — G4733 Obstructive sleep apnea (adult) (pediatric): Secondary | ICD-10-CM

## 2022-08-24 DIAGNOSIS — R911 Solitary pulmonary nodule: Secondary | ICD-10-CM

## 2022-08-24 MED ORDER — BUDESONIDE 0.5 MG/2ML IN SUSP: 0.5000 mg | Freq: Two times a day (BID) | RESPIRATORY_TRACT | 12 refills | Status: DC

## 2022-08-24 MED ORDER — ALBUTEROL SULFATE (2.5 MG/3ML) 0.083% IN NEBU: 2.5000 mg | INHALATION_SOLUTION | Freq: Two times a day (BID) | RESPIRATORY_TRACT | 12 refills | Status: AC

## 2022-08-24 MED ORDER — PREDNISONE 10 MG PO TABS: ORAL_TABLET | ORAL | 0 refills | Status: DC

## 2022-08-24 NOTE — Progress Notes (Signed)
   Subjective:    Patient ID: Jonathan Medina, male    DOB: 1929/10/17, 89 y.o.   MRN: [id number]  HPI  89 yo [place], ex smoker for FU of obstructive sleep apnea & chronic insomnia/ shift work disorder  h/o VATS LUlobectomy 08/2007  for lung cancer  -on CPAP 10 cm   PMH - He has PTSD and has been acting out his dreams occasionally for many years. prostate cancer  - interstitial cystitis and pelvic pain   I last saw him in 2019.  He saw [facility] in October and January for bronchitis and underwent workup including Chest x-ray followed by high-resolution CT chest which showed new ILD.  He did not want further workup-including PFTs or blood work. He arrives today accompanied by his wife and daughter who corroborates history.  He brings in what he states is sputum interim medicine bottle but his wife states that this is a saliva.  His main complaint is cough which she would like to decrease.  His wife states that he is coughing excessively even in the daytime as well as in his sleep and a couple of times she has come close to calling EMS because of this severe cough.  He states that saltwater gargles seem to help There is no cough associated syncope or incontinence. We reviewed his imaging studies  He is on oxycodone twice daily for chronic pain He is compliant with his CPAP machine and denies any problems with mask or pressure..  Acting out his dreams has decreased per his wife. Weight is stable at 220 pounds  Significant tests/ events reviewed   HRCT Chest 06/2022 moderate emphysema in the lung apices, subpleural and lower lung predominant reticular opacities with associated traction bronchiectasis. Probable UIP Stable left lower lobe 9 mm nodule and new left lower lobe 4 mm nodule  PSG 06/2010 showed poor sleep efficiency with TST of 197 mins, AHI 12/h , desatn < 88% x 26 mins & lowest desaturation to 76%. BMI was 35    Review of Systems neg for any significant sore throat, dysphagia,  itching, sneezing, nasal congestion or excess/ purulent secretions, fever, chills, sweats, unintended wt loss, pleuritic or exertional cp, hempoptysis, orthopnea pnd or change in chronic leg swelling. Also denies presyncope, palpitations, heartburn, abdominal pain, nausea, vomiting, diarrhea or change in bowel or urinary habits, dysuria,hematuria, rash, arthralgias, visual complaints, headache, numbness weakness or ataxia.     Objective:   Physical Exam  Gen. Pleasant, obese, in no distress, normal affect ENT - no pallor,icterus, no post nasal drip, class 2-3 airway Neck: No JVD, no thyromegaly, no carotid bruits Lungs: no use of accessory muscles, no dullness to percussion, decreased without rales or rhonchi  Cardiovascular: Rhythm regular, heart sounds  normal, no murmurs or gallops, no peripheral edema Abdomen: soft and non-tender, no hepatosplenomegaly, BS normal. Musculoskeletal: No deformities, no cyanosis or clubbing Neuro:  alert, non focal, no tremors       Assessment & Plan:    Chronic cough -since he has emphysema on his CT scan, we will treat empirically with low-dose prednisone for 2 weeks starting at 20 mg.  Since inhaler technique is poor we will also provide him with a nebulizer with inhaled budesonide and albuterol nebs and see if this decreases his cough. If not we may have to go with treating usual suspects of upper airway cough syndrome and GERD

## 2022-08-24 NOTE — Assessment & Plan Note (Signed)
CPAP download was reviewed which shows excellent control of events on 10 cm with large leak.  He is very compliant and CPAP is only helped improve his daytime somnolence and fatigue  Weight loss encouraged, compliance with goal of at least 4-6 hrs every night is the expectation. Advised against medications with sedative side effects Cautioned against driving when sleepy - understanding that sleepiness will vary on a day to day basis

## 2022-08-24 NOTE — Assessment & Plan Note (Signed)
Will obtain 68-month follow-up for new left lower lobe lung nodule due to his prior history of lung cancer

## 2022-08-24 NOTE — Assessment & Plan Note (Signed)
His main complaint is cough.  I explained implications of diagnosis of pulmonary fibrosis and probable UIP pattern on his high-res CT.  He does not want blood work.  He is not interested in antifibrotic's.  I discussed risks, side effect profile of antifibrotic's, possible progression and natural history of disease.

## 2022-08-24 NOTE — Patient Instructions (Signed)
X Prednisone 10 mg tabs  Take 2 tabs daily with food x 7ds, then 1 tab daily with food x 7ds then STOP  X Nebuliser x 1  Rx for budesonide 0.5 mg nebs twice daily # 30  Albuterol nebs twice daily #30 Ok to take mucinex 600 mg twice daily x  1 week

## 2022-08-29 ENCOUNTER — Telehealth (HOSPITAL_BASED_OUTPATIENT_CLINIC_OR_DEPARTMENT_OTHER): Payer: Self-pay | Admitting: Pulmonary Disease

## 2022-08-29 MED ORDER — BUDESONIDE 0.5 MG/2ML IN SUSP: 0.5000 mg | Freq: Two times a day (BID) | RESPIRATORY_TRACT | 11 refills | Status: DC

## 2022-08-29 NOTE — Telephone Encounter (Signed)
Rx for pt's pulmicort neb sol has been resent to pharmacy for pt with correct quantity and diagnosis code.  Attempted to call pt to let him know that this had been taken care of for him but unable to reach. Unable to leave VM as mailbox is full. Will try to call back later.

## 2022-08-29 NOTE — Telephone Encounter (Signed)
Pt called and stated that his pharmacy needed more information regarding budesonide (PULMICORT) 0.5 MG/2ML nebulizer solution script in order to be filled. [facility] P: [phone]. Also pt states that his pharmacy has never heard of 600 mg Musinex. Pt got the regular dose extra strength. Please advise and call pt with an update.

## 2022-08-31 DIAGNOSIS — N3011 Interstitial cystitis (chronic) with hematuria: Secondary | ICD-10-CM | POA: Diagnosis not present

## 2022-08-31 DIAGNOSIS — N3942 Incontinence without sensory awareness: Secondary | ICD-10-CM | POA: Diagnosis not present

## 2022-08-31 DIAGNOSIS — Z8546 Personal history of malignant neoplasm of prostate: Secondary | ICD-10-CM | POA: Diagnosis not present

## 2022-09-14 ENCOUNTER — Ambulatory Visit (HOSPITAL_BASED_OUTPATIENT_CLINIC_OR_DEPARTMENT_OTHER): Payer: Medicare Other | Admitting: Pulmonary Disease

## 2022-09-15 ENCOUNTER — Ambulatory Visit (INDEPENDENT_AMBULATORY_CARE_PROVIDER_SITE_OTHER): Payer: Medicare Other | Admitting: Podiatry

## 2022-09-15 ENCOUNTER — Ambulatory Visit (INDEPENDENT_AMBULATORY_CARE_PROVIDER_SITE_OTHER): Payer: Medicare Other

## 2022-09-15 DIAGNOSIS — M76822 Posterior tibial tendinitis, left leg: Secondary | ICD-10-CM | POA: Diagnosis not present

## 2022-09-15 DIAGNOSIS — S9032XA Contusion of left foot, initial encounter: Secondary | ICD-10-CM

## 2022-09-15 MED ORDER — DICLOFENAC SODIUM 1 % EX GEL: 4.0000 g | Freq: Four times a day (QID) | CUTANEOUS | 4 refills | Status: AC

## 2022-09-15 NOTE — Patient Instructions (Signed)
Posterior Tibial Tendinitis  Posterior tibial tendinitis is irritation of a tendon called the posterior tibial tendon. Your posterior tibial tendon is a cord-like tissue that connects bones of your lower leg and foot to a muscle that: Supports your arch. Helps you raise up on your toes. Helps you turn your foot down and in. This condition causes foot and ankle pain. It can also lead to a flat foot. What are the causes? This condition is most often caused by repeated stress to the tendon (overuse injury). It can also be caused by a sudden injury that stresses the tendon, such as landing on your foot after jumping or falling. What increases the risk? This condition is more likely to develop in: People who play a sport that involves putting a lot of pressure on the feet, such as: Basketball. Tennis. Soccer. Hockey. Runners. Females who are older than 87 years of age and are overweight. People with diabetes. People with decreased foot stability. People with flat feet. What are the signs or symptoms? Symptoms include: Pain in the inner ankle. Pain at the arch of your foot. Pain that gets worse with running, walking, or standing. Swelling on the inside of your ankle and foot. Weakness in your ankle or foot. Inability to stand up on tiptoe. Flattening of the arch of your foot. How is this diagnosed? This condition may be diagnosed based on: Your symptoms. Your medical history. A physical exam. Tests, such as: X-ray. MRI. Ultrasound. How is this treated? This condition may be treated by: Putting ice to the injured area. Taking NSAIDs, such as ibuprofen, to reduce pain and swelling. Wearing a special shoe or shoe insert to support your arch (orthotic). Having physical therapy. Replacing high-impact exercise with low-impact exercise, such as swimming or cycling. If your symptoms do not improve with these treatments, you may need to wear a splint, removable walking boot, or short  leg cast for 6-8 weeks to keep your foot and ankle still (immobilized). Follow these instructions at home: If you have a cast, splint, or boot: Keep it clean and dry. Check the skin around it every day. Tell your health care provider about any concerns. If you have a cast: Do not stick anything inside it to scratch your skin. Doing that increases your risk of infection. You may put lotion on dry skin around the edges of the cast. Do not put lotion on the skin underneath the cast. If you have a splint or boot: Wear it as told by your health care provider. Remove it only as told by your health care provider. Loosen it if your toes tingle, become numb, or turn cold and blue. Bathing Do not take baths, swim, or use a hot tub until your health care provider approves. Ask your health care provider if you may take showers. If your cast, splint, or boot is not waterproof: Do not let it get wet. Cover it with a waterproof covering while you take a bath or a shower. Managing pain and swelling   If directed, put ice on the injured area. If you have a removable splint or boot, remove it as told by your health care provider. Put ice in a plastic bag. Place a towel between your skin and the bag or between your cast and the bag. Leave the ice on for 20 minutes, 2-3 times a day. Move your toes often to reduce stiffness and swelling. Raise (elevate) the injured area above the level of your heart while you are sitting   or lying down. Activity Do not use the injured foot to support your body weight until your health care provider says that you can. Use crutches as told by your health care provider. Do not do activities that make pain or swelling worse. Ask your health care provider when it is safe to drive if you have a cast, splint, or boot on your foot. Return to your normal activities as told by your health care provider. Ask your health care provider what activities are safe for you. Do exercises as  told by your health care provider. General instructions Take over-the-counter and prescription medicines only as told by your health care provider. If you have an orthotic, use it as told by your health care provider. Keep all follow-up visits as told by your health care provider. This is important. How is this prevented? Wear footwear that is appropriate to your athletic activity. Avoid athletic activities that cause pain or swelling in your ankle or foot. Before being active, do range-of-motion and stretching exercises. If you develop pain or swelling while training, stop training. If you have pain or swelling that does not improve after a few days of rest, see your health care provider. If you start a new athletic activity, start gradually so you can build up your strength and flexibility. Contact a health care provider if: Your symptoms get worse. Your symptoms do not improve in 6-8 weeks. You develop new, unexplained symptoms. Your splint, boot, or cast gets damaged. Summary Posterior tibial tendinitis is irritation of a tendon called the posterior tibial tendon. This condition is most often caused by repeated stress to the tendon (overuse injury). This condition causes foot pain and ankle pain. It can also lead to a flat foot. This condition may be treated by not doing high-impact activities, applying ice, having physical therapy, wearing orthotics, and wearing a cast, splint, or boot if needed. This information is not intended to replace advice given to you by your health care provider. Make sure you discuss any questions you have with your health care provider. Document Revised: 10/16/2018 Document Reviewed: 08/23/2018 Elsevier Patient Education  2020 [employer]  Posterior Tibial Tendinitis Rehab Ask your health care provider which exercises are safe for you. Do exercises exactly as told by your health care provider and adjust them as directed. It is normal to feel mild  stretching, pulling, tightness, or discomfort as you do these exercises. Stop right away if you feel sudden pain or your pain gets worse. Do not begin these exercises until told by your health care provider. Stretching and range-of-motion exercises These exercises warm up your muscles and joints and improve the movement and flexibility in your ankle and foot. These exercises may also help to relieve pain. Standing wall calf stretch, knee straight   Stand with your hands against a wall. Extend your left / right leg behind you, and bend your front knee slightly. If directed, place a folded washcloth under the arch of your foot for support. Point the toes of your back foot slightly inward. Keeping your heels on the floor and your back knee straight, shift your weight toward the wall. Do not allow your back to arch. You should feel a gentle stretch in your upper left / right calf. Hold this position for 10 seconds. Repeat 10 times. Complete this exercise 2 times a day. Standing wall calf stretch, knee bent Stand with your hands against a wall. Extend your left / right leg behind you, and bend your front   knee slightly. If directed, place a folded washcloth under the arch of your foot for support. Point the toes of your back foot slightly inward. Unlock your back knee so it is bent. Keep your heels on the floor. You should feel a gentle stretch deep in your lower left / right calf. Hold this position for 10 seconds. Repeat 10 times. Complete this exercise 2 times a day. Strengthening exercises These exercises build strength and endurance in your ankle and foot. Endurance is the ability to use your muscles for a long time, even after they get tired. Ankle inversion with band Secure one end of a rubber exercise band or tubing to a fixed object, such as a table leg or a pole, that will stay still when the band is pulled. Loop the other end of the band around the middle of your left / right foot. Sit  on the floor facing the object with your left / right leg extended. The band or tube should be slightly tense when your foot is relaxed. Leading with your big toe, slowly bring your left / right foot and ankle inward, toward your other foot (inversion). Hold this position for 10 seconds. Slowly return your foot to the starting position. Repeat 10 times. Complete this exercise 2 times a day. Towel curls   Sit in a chair on a non-carpeted surface, and put your feet on the floor. Place a towel in front of your feet. Keeping your heel on the floor, put your left / right foot on the towel. Pull the towel toward you by grabbing the towel with your toes and curling them under. Keep your heel on the floor while you do this. Let your toes relax. Grab the towel with your toes again. Keep going until the towel is completely underneath your foot. Repeat 10 times. Complete this exercise 2 times a day. Balance exercise This exercise improves or maintains your balance. Balance is important in preventing falls. Single leg stand Without wearing shoes, stand near a railing or in a doorway. You may hold on to the railing or door frame as needed for balance. Stand on your left / right foot. Keep your big toe down on the floor and try to keep your arch lifted. If balancing in this position is too easy, try the exercise with your eyes closed or while standing on a pillow. Hold this position for 10 seconds. Repeat 10 times. Complete this exercise 2 times a day. This information is not intended to replace advice given to you by your health care provider. Make sure you discuss any questions you have with your health care provider.  

## 2022-09-19 ENCOUNTER — Encounter: Payer: Self-pay | Admitting: Podiatry

## 2022-09-19 NOTE — Telephone Encounter (Signed)
Rigoberto Noel, MD  [place] Pepper, [place] Cc: Priscille Kluver, CMA; Jamyron Redd, Waldemar Dickens, CMA Thanks Melissa Holly/ Antonela Freiman - can we please help him with nebs?  Thanks,  RA       Previous Messages    ----- Message ----- From: Darlina Guys Sent: 09/14/2022   6:27 AM EDT To: Rigoberto Noel, MD; Chari Manning, CMA; * Subject: neb meds                                      Good morning,  We provided a nebulizer for this patient recently.  Wanted to let you know that this patient has MCR part B and a supplement, so he is able to receive his neb meds for little to no out of pocket by billing his Part B through our pharmacy instead of the retail pharmacy. (DME company vs. retail pharmacy) This will also save his part D benefit and help him prolong or avoid the donut hole.    You can escribe or fax scripts to:  [facility]. [address] [place] [address] fax: [phone]  Thank you for letting us help care for your patients. Lenna Sciara [facility] [phone]    Pt's neb sol was recently sent to pharmacy for him 08/29/22. Pt had been tried to be reached prior but nobody was ever able to get pt on the phone. Pt does have an appt with  Dr. Elsworth Soho 3/22 where this can be discussed with him during that appt. Routing to Dr. Elsworth Soho as an Juluis Rainier about pt's neb sol to see if pt would rather have this sent to [facility] pharmacy or if he is okay with it still being sent to local pharmacy.

## 2022-09-19 NOTE — Progress Notes (Signed)
  Subjective:  Patient ID: Jonathan Medina, male    DOB: 02/23/1930,  MRN: [id number]  Chief Complaint  Patient presents with   Foot Pain    np left foot/ankle pain and swelling( may have injured last week on sunday when moving washing machine)    89 y.o. male presents with the above complaint. History confirmed with patient.  He did not fall or dropped anything on the foot, he was just on it more than he normally is doing more activity.  Notes pain along the inside of the ankle and only hurts when he is walking really  Objective:  Physical Exam: warm, good capillary refill, no trophic changes or ulcerative lesions, normal DP and PT pulses, normal sensory exam, and pain and tenderness along PT tendon at distal area of insertion, no ecchymosis or severe edema   Radiographs: Multiple views x-ray of the left foot: no fracture, dislocation, swelling or degenerative changes noted Assessment:   1. Posterior tibial tendinitis of left lower extremity      Plan:  Patient was evaluated and treated and all questions answered.   Discussed the etiology and treatment options for posterior tibial tendinitis including stretching, formal physical therapy with an eccentric exercises therapy plan, supportive shoegears such as a running shoe or sneaker, bracing, topical and oral medications.  We also discussed that I do not routinely perform injections in this area because of the risk of an increased damage or rupture of the tendon.  We also discussed the role of surgical treatment of this for patients who do not improve after exhausting non-surgical treatment options.  -XR reviewed with patient -Educated on stretching and icing of the affected limb. -Rx for Voltaren gel for topical relief send he can use 3-4 times daily -Compression sleeve applied he will wear this for support and reduction of edema -If not improving would recommend referral to physical therapy  Return if symptoms worsen or fail to  improve.

## 2022-09-21 ENCOUNTER — Ambulatory Visit (HOSPITAL_BASED_OUTPATIENT_CLINIC_OR_DEPARTMENT_OTHER): Payer: Medicare Other | Admitting: Pulmonary Disease

## 2022-09-23 ENCOUNTER — Encounter (HOSPITAL_BASED_OUTPATIENT_CLINIC_OR_DEPARTMENT_OTHER): Payer: Self-pay | Admitting: Pulmonary Disease

## 2022-09-23 ENCOUNTER — Ambulatory Visit (INDEPENDENT_AMBULATORY_CARE_PROVIDER_SITE_OTHER): Payer: Medicare Other | Admitting: Pulmonary Disease

## 2022-09-23 VITALS — BP 160/80 | HR 67 | Ht 68.0 in | Wt 219.0 lb

## 2022-09-23 DIAGNOSIS — G4733 Obstructive sleep apnea (adult) (pediatric): Secondary | ICD-10-CM

## 2022-09-23 DIAGNOSIS — R053 Chronic cough: Secondary | ICD-10-CM

## 2022-09-23 DIAGNOSIS — J849 Interstitial pulmonary disease, unspecified: Secondary | ICD-10-CM | POA: Diagnosis not present

## 2022-09-23 NOTE — Assessment & Plan Note (Signed)
CPAP download was reviewed which shows excellent control of events on 10 cm with large leak.  He is very compliant and CPAP is only helped improve his daytime somnolence and fatigue  Weight loss encouraged, compliance with goal of at least 4-6 hrs every night is the expectation. Advised against medications with sedative side effects Cautioned against driving when sleepy - understanding that sleepiness will vary on a day to day basis  

## 2022-09-23 NOTE — Assessment & Plan Note (Signed)
Likely IPF in his age group.  He did not want further workup.  He was not interested in antifibrotic's.

## 2022-09-23 NOTE — Progress Notes (Signed)
   Subjective:    Patient ID: Jonathan Medina, male    DOB: 1929-09-08, 89 y.o.   MRN: [id number]  HPI  89 yo [place], ex smoker for FU of obstructive sleep apnea & new diagnosis of ILD He has chronic insomnia/ shift work disorder   h/o VATS LUlobectomy 08/2007  for lung cancer   -on CPAP 10 cm   PMH - He has PTSD and has been acting out his dreams occasionally for many years. prostate cancer  - interstitial cystitis and pelvic pain   Last OV 08/2022 for ILD, main symptom was chronic cough --we treated him with low-dose prednisone for 2 weeks Budesonide and albuterol nebs We discussed antifibrotic's and decided not to pursue He states that cough is much improved however wife feels that he is still coughing. Accompanied by his wife and daughter  he feels that budesonide made him feel jittery and nervous and he will not take this anymore.  He was taking albuterol until 3/20.  He finished a course of prednisone He is sleeping well.  No problems with mask or pressure   Significant tests/ events reviewed     HRCT Chest 06/2022 moderate emphysema in the lung apices, subpleural and lower lung predominant reticular opacities with associated traction bronchiectasis. Probable UIP Stable left lower lobe 9 mm nodule and new left lower lobe 4 mm nodule   PSG 06/2010 showed poor sleep efficiency with TST of 197 mins, AHI 12/h , desatn < 88% x 26 mins & lowest desaturation to 76%. BMI was 35   Review of Systems neg for any significant sore throat, dysphagia, itching, sneezing, nasal congestion or excess/ purulent secretions, fever, chills, sweats, unintended wt loss, pleuritic or exertional cp, hempoptysis, orthopnea pnd or change in chronic leg swelling. Also denies presyncope, palpitations, heartburn, abdominal pain, nausea, vomiting, diarrhea or change in bowel or urinary habits, dysuria,hematuria, rash, arthralgias, visual complaints, headache, numbness weakness or ataxia.     Objective:    Physical Exam  Gen. Pleasant, well-nourished, in no distress ENT - no thrush, no pallor/icterus,no post nasal drip Neck: No JVD, no thyromegaly, no carotid bruits Lungs: no use of accessory muscles, no dullness to percussion, bibasal fine crackles Cardiovascular: Rhythm regular, heart sounds  normal, no murmurs or gallops, no peripheral edema Musculoskeletal: No deformities, no cyanosis or clubbing        Assessment & Plan:

## 2022-09-23 NOTE — Assessment & Plan Note (Signed)
Chronic cough is his main complaint and he had some relief with inhaled budesonide.  I explained to him that symptoms of jitteriness were likely due to albuterol and not due to budesonide.  I also explained that there is no interaction between inhaled budesonide and diclofenac gel for local application. I have asked him to continue using budesonide once daily for 2 more weeks.  If he has side effects he can certainly discontinue before

## 2022-09-23 NOTE — Patient Instructions (Signed)
Okay to stop taking albuterol and use only as needed if you have been using.  Okay to take budesonide only once daily-please continue to use this if you do not have side effects  Okay to use cough drops. Okay to use OTC Delsym cough syrup 5 mL twice daily as needed if cough gets worse

## 2022-12-01 ENCOUNTER — Other Ambulatory Visit: Payer: Self-pay | Admitting: Internal Medicine

## 2022-12-01 DIAGNOSIS — I10 Essential (primary) hypertension: Secondary | ICD-10-CM

## 2022-12-02 DIAGNOSIS — Z8546 Personal history of malignant neoplasm of prostate: Secondary | ICD-10-CM | POA: Diagnosis not present

## 2022-12-02 DIAGNOSIS — N3942 Incontinence without sensory awareness: Secondary | ICD-10-CM | POA: Diagnosis not present

## 2022-12-02 DIAGNOSIS — N3011 Interstitial cystitis (chronic) with hematuria: Secondary | ICD-10-CM | POA: Diagnosis not present

## 2022-12-02 DIAGNOSIS — R3121 Asymptomatic microscopic hematuria: Secondary | ICD-10-CM | POA: Diagnosis not present

## 2022-12-05 ENCOUNTER — Telehealth: Payer: Self-pay | Admitting: Internal Medicine

## 2022-12-05 ENCOUNTER — Other Ambulatory Visit: Payer: Self-pay | Admitting: Internal Medicine

## 2022-12-05 DIAGNOSIS — I10 Essential (primary) hypertension: Secondary | ICD-10-CM

## 2022-12-05 MED ORDER — AMLODIPINE BESYLATE 5 MG PO TABS: 5.0000 mg | ORAL_TABLET | Freq: Every day | ORAL | 0 refills | Status: DC

## 2022-12-05 MED ORDER — CARVEDILOL PHOSPHATE ER 20 MG PO CP24: 20.0000 mg | ORAL_CAPSULE | Freq: Every morning | ORAL | 0 refills | Status: DC

## 2022-12-05 NOTE — Telephone Encounter (Signed)
Pt called wanted to know if he can have a temporary refill on his medication until his appt on 6/10.  Prescription Request  12/05/2022  LOV: 04/12/2022  What is the name of the medication or equipment?  amLODipine (NORVASC) 5 MG tablet   Pt want to know if he can get a temporary refill until is appt on 6/18 pt states he is running low. carvedilol (COREG CR) 20 MG 24 hr capsule  Have you contacted your pharmacy to request a refill? No   [facility] - [place], [place] - [address] [place] [address] Phone: [phone] Fax: [phone]   Which pharmacy would you like this sent to?    Patient notified that their request is being sent to the clinical staff for review and that they should receive a response within 2 business days.   Please advise at Mobile There is no such number on file (mobile).

## 2022-12-06 ENCOUNTER — Other Ambulatory Visit: Payer: Self-pay | Admitting: Internal Medicine

## 2022-12-06 DIAGNOSIS — I10 Essential (primary) hypertension: Secondary | ICD-10-CM

## 2022-12-06 MED ORDER — CARVEDILOL PHOSPHATE ER 20 MG PO CP24
20.0000 mg | ORAL_CAPSULE | Freq: Every morning | ORAL | 0 refills | Status: DC
Start: 2022-12-06 — End: 2022-12-12

## 2022-12-06 NOTE — Telephone Encounter (Signed)
Patient called and said his Carvedilol 30 day supply will be cheaper through [facility] - [place], [place] - [address] . He spoke with them and they said the could get it to him ASAP. He would like to know if the refill for Carvedilol can be sent to [facility] instead. Best callback is [phone].

## 2022-12-06 NOTE — Telephone Encounter (Signed)
Rx has been sent to mailorder.  

## 2022-12-08 NOTE — Telephone Encounter (Signed)
Pt is requesting we send amLODipine (NORVASC) 5 MG tablet (90 day supply) to [facility].   [facility] wants too much money for a 30 day supply.   Pt has an upcoming appointment on 6.18.24

## 2022-12-09 ENCOUNTER — Telehealth: Payer: Self-pay | Admitting: Internal Medicine

## 2022-12-09 DIAGNOSIS — I10 Essential (primary) hypertension: Secondary | ICD-10-CM

## 2022-12-09 NOTE — Telephone Encounter (Signed)
Prescription Request  12/09/2022  LOV: 04/12/2022  What is the name of the medication or equipment?   Pt wants to know Dr. Yetta Barre could do a temporary refill amLODipine (NORVASC) 5 MG tablet. He states he will be at his apppt on 6/18  Pt also want to could he call in the 90 day supply to [facility] for carvedilol (COREG CR) 20 MG 24 hr capsule   Have you contacted your pharmacy to request a refill? No   Which pharmacy would you like this sent to?    [facility] - [place], [place] - [address] [place] [address] Phone: [phone] Fax: [phone]   Patient notified that their request is being sent to the clinical staff for review and that they should receive a response within 2 business days.   Please advise at Mobile There is no such number on file (mobile).

## 2022-12-12 ENCOUNTER — Other Ambulatory Visit: Payer: Self-pay | Admitting: Internal Medicine

## 2022-12-12 DIAGNOSIS — I10 Essential (primary) hypertension: Secondary | ICD-10-CM

## 2022-12-12 MED ORDER — CARVEDILOL PHOSPHATE ER 20 MG PO CP24
20.0000 mg | ORAL_CAPSULE | Freq: Every morning | ORAL | 0 refills | Status: DC
Start: 2022-12-12 — End: 2022-12-20

## 2022-12-12 MED ORDER — AMLODIPINE BESYLATE 5 MG PO TABS
5.0000 mg | ORAL_TABLET | Freq: Every day | ORAL | 0 refills | Status: DC
Start: 2022-12-12 — End: 2022-12-20

## 2022-12-12 NOTE — Telephone Encounter (Signed)
Patient called and said he spoke with [facility]. They said they are only able to do a 90 day supply for his Carvedilol. He would like to know if the 90 day supply can be sent in. Best callback is [phone].

## 2022-12-20 ENCOUNTER — Encounter: Payer: Self-pay | Admitting: Internal Medicine

## 2022-12-20 ENCOUNTER — Ambulatory Visit (INDEPENDENT_AMBULATORY_CARE_PROVIDER_SITE_OTHER): Payer: Medicare Other | Admitting: Internal Medicine

## 2022-12-20 VITALS — BP 124/78 | HR 61 | Temp 98.2°F | Resp 16 | Ht 68.0 in | Wt 219.0 lb

## 2022-12-20 DIAGNOSIS — R7303 Prediabetes: Secondary | ICD-10-CM

## 2022-12-20 DIAGNOSIS — E785 Hyperlipidemia, unspecified: Secondary | ICD-10-CM | POA: Diagnosis not present

## 2022-12-20 DIAGNOSIS — I10 Essential (primary) hypertension: Secondary | ICD-10-CM | POA: Diagnosis not present

## 2022-12-20 DIAGNOSIS — K219 Gastro-esophageal reflux disease without esophagitis: Secondary | ICD-10-CM | POA: Diagnosis not present

## 2022-12-20 DIAGNOSIS — N1832 Chronic kidney disease, stage 3b: Secondary | ICD-10-CM

## 2022-12-20 DIAGNOSIS — M1A09X0 Idiopathic chronic gout, multiple sites, without tophus (tophi): Secondary | ICD-10-CM

## 2022-12-20 LAB — HEPATIC FUNCTION PANEL
ALT: 14 U/L (ref 0–53)
AST: 15 U/L (ref 0–37)
Albumin: 4.1 g/dL (ref 3.5–5.2)
Alkaline Phosphatase: 62 U/L (ref 39–117)
Bilirubin, Direct: 0.1 mg/dL (ref 0.0–0.3)
Total Bilirubin: 0.6 mg/dL (ref 0.2–1.2)
Total Protein: 7.3 g/dL (ref 6.0–8.3)

## 2022-12-20 LAB — CBC WITH DIFFERENTIAL/PLATELET
Basophils Absolute: 0 10*3/uL (ref 0.0–0.1)
Basophils Relative: 0.4 % (ref 0.0–3.0)
Eosinophils Absolute: 0.1 10*3/uL (ref 0.0–0.7)
Eosinophils Relative: 1.4 % (ref 0.0–5.0)
HCT: 40.8 % (ref 39.0–52.0)
Hemoglobin: 13.3 g/dL (ref 13.0–17.0)
Lymphocytes Relative: 40.4 % (ref 12.0–46.0)
Lymphs Abs: 2.3 10*3/uL (ref 0.7–4.0)
MCHC: 32.5 g/dL (ref 30.0–36.0)
MCV: 92 fl (ref 78.0–100.0)
Monocytes Absolute: 0.6 10*3/uL (ref 0.1–1.0)
Monocytes Relative: 9.5 % (ref 3.0–12.0)
Neutro Abs: 2.8 10*3/uL (ref 1.4–7.7)
Neutrophils Relative %: 48.3 % (ref 43.0–77.0)
Platelets: 238 10*3/uL (ref 150.0–400.0)
RBC: 4.43 Mil/uL (ref 4.22–5.81)
RDW: 13.4 % (ref 11.5–15.5)
WBC: 5.8 10*3/uL (ref 4.0–10.5)

## 2022-12-20 LAB — LIPID PANEL
Cholesterol: 208 mg/dL — ABNORMAL HIGH (ref 0–200)
HDL: 46.8 mg/dL (ref >39.00)
NonHDL: 161.51
Total CHOL/HDL Ratio: 4
Triglycerides: 232 mg/dL — ABNORMAL HIGH (ref 0.0–149.0)
VLDL: 46.4 mg/dL — ABNORMAL HIGH (ref 0.0–40.0)

## 2022-12-20 LAB — BASIC METABOLIC PANEL
BUN: 26 mg/dL — ABNORMAL HIGH (ref 6–23)
CO2: 31 mEq/L (ref 19–32)
Calcium: 9.3 mg/dL (ref 8.4–10.5)
Chloride: 101 mEq/L (ref 96–112)
Creatinine, Ser: 1.21 mg/dL (ref 0.40–1.50)
GFR: 52.52 mL/min — ABNORMAL LOW (ref >60.00)
Glucose, Bld: 77 mg/dL (ref 70–99)
Potassium: 4.4 mEq/L (ref 3.5–5.1)
Sodium: 139 mEq/L (ref 135–145)

## 2022-12-20 LAB — TSH: TSH: 2.8 u[IU]/mL (ref 0.35–5.50)

## 2022-12-20 LAB — LDL CHOLESTEROL, DIRECT: Direct LDL: 128 mg/dL

## 2022-12-20 LAB — HEMOGLOBIN A1C: Hgb A1c MFr Bld: 5.8 % (ref 4.6–6.5)

## 2022-12-20 LAB — URIC ACID: Uric Acid, Serum: 5.7 mg/dL (ref 4.0–7.8)

## 2022-12-20 MED ORDER — AMLODIPINE BESYLATE 5 MG PO TABS: 5.0000 mg | ORAL_TABLET | Freq: Every day | ORAL | 1 refills | Status: DC

## 2022-12-20 MED ORDER — CARVEDILOL PHOSPHATE ER 20 MG PO CP24: 20.0000 mg | ORAL_CAPSULE | Freq: Every morning | ORAL | 1 refills | Status: DC

## 2022-12-20 NOTE — Progress Notes (Unsigned)
Subjective:  Patient ID: Jonathan Medina, male    DOB: [birth year]  Age: 89 y.o. MRN: [id number]  CC: Hypertension and Hyperlipidemia   HPI Jonathan Medina presents for f/up ----  Discussed the use of AI scribe software for clinical note transcription with the patient, who gave verbal consent to proceed.  History of Present Illness   The patient, a 89 year old with a history of COPD and prostate issues, presents with concerns regarding his medication prescriptions. He reports a delay in receiving his 90-day supply of two unspecified medications, which he has been taking for years. He expresses a preference for maintaining his medication regimen on a 90-day basis.  The patient denies any new health issues, attributing his overall well-being to managing his two chronic conditions. He reports a stable blood pressure, typically around 124, with no symptoms of hypotension such as dizziness or lightheadedness. He denies any chest pain or radiating arm pain, attributing his reduced mobility to his COPD.  Regarding his COPD, the patient reports productive cough with clear phlegm. He has been using a CPAP machine for years, which he manages well. He previously used Pulmicort, but discontinued its use due to oral discomfort, which he attributes to not being instructed to rinse his mouth post-inhalation. Since discontinuing the inhaler, he reports feeling much better.       Outpatient Medications Prior to Visit  Medication Sig Dispense Refill   albuterol (PROVENTIL) (2.5 MG/3ML) 0.083% nebulizer solution Take 3 mLs (2.5 mg total) by nebulization 2 (two) times daily. 75 mL 12   budesonide (PULMICORT) 0.5 MG/2ML nebulizer solution Take 2 mLs (0.5 mg total) by nebulization 2 (two) times daily. 360 mL 11   diclofenac Sodium (VOLTAREN) 1 % GEL Apply 4 g topically 4 (four) times daily. 100 g 4   oxyCODONE (OXY IR/ROXICODONE) 5 MG immediate release tablet Take 5 mg by mouth in the morning and at bedtime.      tamsulosin (FLOMAX) 0.4 MG CAPS capsule Take 1 capsule (0.4 mg total) by mouth daily after breakfast. 90 capsule 0   amLODipine (NORVASC) 5 MG tablet Take 1 tablet (5 mg total) by mouth daily. 30 tablet 0   carvedilol (COREG CR) 20 MG 24 hr capsule Take 1 capsule (20 mg total) by mouth every morning. 90 capsule 0   No facility-administered medications prior to visit.    ROS Review of Systems  Constitutional:  Negative for chills, diaphoresis, fatigue and fever.  HENT: Negative.    Respiratory:  Positive for cough. Negative for chest tightness, shortness of breath and wheezing.   Cardiovascular:  Negative for chest pain, palpitations and leg swelling.  Gastrointestinal:  Negative for abdominal pain, constipation, diarrhea, nausea and vomiting.  Genitourinary: Negative.   Musculoskeletal: Negative.  Negative for myalgias.  Skin: Negative.   Neurological:  Negative for dizziness and weakness.  Hematological:  Negative for adenopathy. Does not bruise/bleed easily.  Psychiatric/Behavioral: Negative.      Objective:  BP 124/78 (BP Location: Left Arm, Patient Position: Sitting, Cuff Size: Large)   Pulse 61   Temp 98.2 F (36.8 C) (Oral)   Resp 16   Ht 5\' 8"  (1.727 m)   Wt 219 lb (99.3 kg)   SpO2 94%   BMI 33.30 kg/m   BP Readings from Last 3 Encounters:  12/20/22 124/78  09/23/22 (!) 160/80  08/24/22 (!) 142/80    Wt Readings from Last 3 Encounters:  12/20/22 219 lb (99.3 kg)  09/23/22 219 lb (99.3 kg)  08/24/22 220 lb (99.8 kg)    Physical Exam Vitals reviewed.  Constitutional:      Appearance: He is obese. He is not ill-appearing.  HENT:     Mouth/Throat:     Mouth: Mucous membranes are moist.  Eyes:     General: No scleral icterus.    Conjunctiva/sclera: Conjunctivae normal.  Cardiovascular:     Rate and Rhythm: Normal rate and regular rhythm.     Heart sounds: No murmur heard. Pulmonary:     Effort: Pulmonary effort is normal.     Breath sounds: No stridor.  No wheezing, rhonchi or rales.  Abdominal:     General: Abdomen is protuberant. Bowel sounds are normal. There is no distension.     Palpations: Abdomen is soft. There is no hepatomegaly, splenomegaly or mass.     Tenderness: There is no abdominal tenderness. There is no guarding or rebound.  Musculoskeletal:        General: Normal range of motion.     Cervical back: Normal range of motion.     Right lower leg: No edema.     Left lower leg: No edema.  Skin:    General: Skin is warm and dry.  Neurological:     General: No focal deficit present.     Mental Status: He is alert. Mental status is at baseline.  Psychiatric:        Mood and Affect: Mood normal.        Behavior: Behavior normal.     Lab Results  Component Value Date   WBC 5.8 12/20/2022   HGB 13.3 12/20/2022   HCT 40.8 12/20/2022   PLT 238.0 12/20/2022   GLUCOSE 77 12/20/2022   CHOL 208 (H) 12/20/2022   TRIG 232.0 (H) 12/20/2022   HDL 46.80 12/20/2022   LDLDIRECT 128.0 12/20/2022   LDLCALC 118 (H) 10/04/2021   ALT 14 12/20/2022   AST 15 12/20/2022   NA 139 12/20/2022   K 4.4 12/20/2022   CL 101 12/20/2022   CREATININE 1.21 12/20/2022   BUN 26 (H) 12/20/2022   CO2 31 12/20/2022   TSH 2.80 12/20/2022   PSA 0.54 07/17/2019   INR 0.9 08/07/2007   HGBA1C 5.8 12/20/2022    CT Chest High Resolution  Result Date: 06/10/2022 CLINICAL DATA:  Cough EXAM: CT CHEST WITHOUT CONTRAST TECHNIQUE: Multidetector CT imaging of the chest was performed following the standard protocol without intravenous contrast. High resolution imaging of the lungs, as well as inspiratory and expiratory imaging, was performed. RADIATION DOSE REDUCTION: This exam was performed according to the departmental dose-optimization program which includes automated exposure control, adjustment of the mA and/or kV according to patient size and/or use of iterative reconstruction technique. COMPARISON:  Chest CT dated August 31, 2010 FINDINGS:  Cardiovascular: Cardiomegaly. No pericardial effusion. Normal caliber thoracic aorta with moderate calcified plaque. Mediastinum/Nodes: Esophagus is unremarkable. Right thyroid nodule measuring 1.5 cm, unchanged when compared with 2012 prior. No pathologically enlarged lymph nodes seen in the chest. Lungs/Pleura: Central airways are patent. Stable postsurgical scarring of the right upper lung status post wedge resection. Moderate paraseptal emphysema of the lung apices. No evidence of air trapping subpleural and lower lung predominant reticular opacities with associated traction bronchiectasis and pulmonary ossification. No evidence of honeycomb change. Solid pulmonary nodule of the left lower lobe measuring 9 x 4 mm on series 10, image 126, unchanged when compared with 2012 prior and considered benign. Solid nodule of the left lower lobe measuring 4 mm on  series 10, image 206, new when compared with prior. Upper Abdomen: No acute abnormality. Musculoskeletal: No chest wall mass or suspicious bone lesions identified. IMPRESSION: 1. Subpleural and lower lung predominant reticular opacities with associated traction bronchiectasis. Findings are new when compared with 2012 prior. Findings are categorized as probable UIP per consensus guidelines: Diagnosis of Idiopathic Pulmonary Fibrosis: An Official ATS/ERS/JRS/ALAT Clinical Practice Guideline. Am Rosezetta Schlatter Crit Care Med Vol 198, Iss 5, [phone], Mar 04 2017. 2. Solid nodule of the left lower lobe measuring 4 mm, not seen on prior exam. No follow-up needed if patient is low-risk.This recommendation follows the consensus statement: Guidelines for Management of Incidental Pulmonary Nodules Detected on CT Images: From the Fleischner Society 2017; Radiology 2017; 284:228-243. 3. Aortic Atherosclerosis (ICD10-I70.0) and Emphysema (ICD10-J43.9). Electronically Signed   By: Allegra Lai M.D.   On: 06/10/2022 09:03    Assessment & Plan:   Essential hypertension- His  blood pressure is well-controlled. -     Basic metabolic panel; Future -     CBC with Differential/Platelet; Future -     TSH; Future -     Hepatic function panel; Future -     amLODIPine Besylate; Take 1 tablet (5 mg total) by mouth daily.  Dispense: 90 tablet; Refill: 1 -     Carvedilol Phosphate ER; Take 1 capsule (20 mg total) by mouth every morning.  Dispense: 90 capsule; Refill: 1  Prediabetes -     Basic metabolic panel; Future -     Hemoglobin A1c; Future  Idiopathic chronic gout of multiple sites without tophus-he has achieved his uric acid goal. -     Basic metabolic panel; Future -     Uric acid; Future  Hyperlipidemia with target LDL less than 130- He is not willing to take a statin. -     Lipid panel; Future -     TSH; Future -     Hepatic function panel; Future  Gastroesophageal reflux disease without esophagitis -     CBC with Differential/Platelet; Future  Stage 3b chronic kidney disease (HCC)- His renal function is stable. -     Basic metabolic panel; Future  Other orders -     LDL cholesterol, direct     Follow-up: Return in about 6 months (around 06/21/2023).  Sanda Linger, MD

## 2022-12-20 NOTE — Patient Instructions (Signed)
Gout  Gout is a condition that causes painful swelling of the joints. Gout is a type of inflammation of the joints (arthritis). This condition is caused by having too much uric acid in the body. Uric acid is a chemical that forms when the body breaks down substances called purines. Purines are important for building body proteins. When the body has too much uric acid, sharp crystals can form and build up inside the joints. This causes pain and swelling. Gout attacks can happen quickly and may be very painful (acute gout). Over time, the attacks can affect more joints and become more frequent (chronic gout). Gout can also cause uric acid to build up under the skin and inside the kidneys. What are the causes? This condition is caused by too much uric acid in your blood. This can happen because: Your kidneys do not remove enough uric acid from your blood. This is the most common cause. Your body makes too much uric acid. This can happen with some cancers and cancer treatments. It can also occur if your body is breaking down too many red blood cells (hemolytic anemia). You eat too many foods that are high in purines. These foods include organ meats and some seafood. Alcohol, especially beer, is also high in purines. A gout attack may be triggered by trauma or stress. What increases the risk? The following factors may make you more likely to develop this condition: Having a family history of gout. Being male and middle-aged. Being male and having gone through menopause. Taking certain medicines, including aspirin, cyclosporine, diuretics, levodopa, and niacin. Having an organ transplant. Having certain conditions, such as: Being obese. Lead poisoning. Kidney disease. A skin condition called psoriasis. Other factors include: Losing weight too quickly. Being dehydrated. Frequently drinking alcohol, especially beer. Frequently drinking beverages that are sweetened with a type of sugar called  fructose. What are the signs or symptoms? An attack of acute gout happens quickly. It usually occurs in just one joint. The most common place is the big toe. Attacks often start at night. Other joints that may be affected include joints of the feet, ankle, knee, fingers, wrist, or elbow. Symptoms of this condition may include: Severe pain. Warmth. Swelling. Stiffness. Tenderness. The affected joint may be very painful to touch. Shiny, red, or purple skin. Chills and fever. Chronic gout may cause symptoms more frequently. More joints may be involved. You may also have white or yellow lumps (tophi) on your hands or feet or in other areas near your joints. How is this diagnosed? This condition is diagnosed based on your symptoms, your medical history, and a physical exam. You may have tests, such as: Blood tests to measure uric acid levels. Removal of joint fluid with a thin needle (aspiration) to look for uric acid crystals. X-rays to look for joint damage. How is this treated? Treatment for this condition has two phases: treating an acute attack and preventing future attacks. Acute gout treatment may include medicines to reduce pain and swelling, including: NSAIDs, such as ibuprofen. Steroids. These are strong anti-inflammatory medicines that can be taken by mouth (orally) or injected into a joint. Colchicine. This medicine relieves pain and swelling when it is taken soon after an attack. It can be given by mouth or through an IV. Preventive treatment may include: Daily use of smaller doses of NSAIDs or colchicine. Use of a medicine that reduces uric acid levels in your blood, such as allopurinol. Changes to your diet. You may need to see   a dietitian about what to eat and drink to prevent gout. Follow these instructions at home: During a gout attack  If directed, put ice on the affected area. To do this: Put ice in a plastic bag. Place a towel between your skin and the bag. Leave the  ice on for 20 minutes, 2-3 times a day. Remove the ice if your skin turns bright red. This is very important. If you cannot feel pain, heat, or cold, you have a greater risk of damage to the area. Raise (elevate) the affected joint above the level of your heart as often as possible. Rest the joint as much as possible. If the affected joint is in your leg, you may be given crutches to use. Follow instructions from your health care provider about eating or drinking restrictions. Avoiding future gout attacks Follow a low-purine diet as told by your dietitian or health care provider. Avoid foods and drinks that are high in purines, including liver, kidney, anchovies, asparagus, herring, mushrooms, mussels, and beer. Maintain a healthy weight or lose weight if you are overweight. If you want to lose weight, talk with your health care provider. Do not lose weight too quickly. Start or maintain an exercise program as told by your health care provider. Eating and drinking Avoid drinking beverages that contain fructose. Drink enough fluids to keep your urine pale yellow. If you drink alcohol: Limit how much you have to: 0-1 drink a day for women who are not pregnant. 0-2 drinks a day for men. Know how much alcohol is in a drink. In the [place] one drink equals one 12 oz bottle of beer (355 mL), one 5 oz glass of wine (148 mL), or one 1 oz glass of hard liquor (44 mL). General instructions Take over-the-counter and prescription medicines only as told by your health care provider. Ask your health care provider if the medicine prescribed to you requires you to avoid driving or using machinery. Return to your normal activities as told by your health care provider. Ask your health care provider what activities are safe for you. Keep all follow-up visits. This is important. Where to find more information National Institutes of Health: www.niams.nih.gov Contact a health care provider if you have: Another  gout attack. Continuing symptoms of a gout attack after 10 days of treatment. Side effects from your medicines. Chills or a fever. Burning pain when you urinate. Pain in your lower back or abdomen. Get help right away if you: Have severe or uncontrolled pain. Cannot urinate. Summary Gout is painful swelling of the joints caused by having too much uric acid in the body. The most common site for gout to occur is in the big toe, but it can affect other joints in the body. Medicines and dietary changes can help to prevent and treat gout attacks. This information is not intended to replace advice given to you by your health care provider. Make sure you discuss any questions you have with your health care provider. Document Revised: 03/24/2021 Document Reviewed: 03/24/2021 Elsevier Patient Education  2024 Elsevier Inc.  

## 2023-03-03 DIAGNOSIS — R351 Nocturia: Secondary | ICD-10-CM | POA: Diagnosis not present

## 2023-03-03 DIAGNOSIS — N401 Enlarged prostate with lower urinary tract symptoms: Secondary | ICD-10-CM | POA: Diagnosis not present

## 2023-03-03 DIAGNOSIS — N3011 Interstitial cystitis (chronic) with hematuria: Secondary | ICD-10-CM | POA: Diagnosis not present

## 2023-03-03 DIAGNOSIS — Z8546 Personal history of malignant neoplasm of prostate: Secondary | ICD-10-CM | POA: Diagnosis not present

## 2023-03-23 DIAGNOSIS — Z23 Encounter for immunization: Secondary | ICD-10-CM | POA: Diagnosis not present

## 2023-03-24 ENCOUNTER — Telehealth: Payer: Self-pay | Admitting: Internal Medicine

## 2023-03-24 DIAGNOSIS — I10 Essential (primary) hypertension: Secondary | ICD-10-CM

## 2023-03-24 MED ORDER — CARVEDILOL PHOSPHATE ER 20 MG PO CP24
20.0000 mg | ORAL_CAPSULE | Freq: Every morning | ORAL | 0 refills | Status: DC
Start: 2023-03-24 — End: 2023-06-21

## 2023-03-24 NOTE — Telephone Encounter (Signed)
Prescription Request  03/24/2023  LOV: 12/20/2022  What is the name of the medication or equipment? carvedilol (COREG CR) 20 MG 24 hr capsule   Have you contacted your pharmacy to request a refill? No   Which pharmacy would you like this sent to?     [id number] - [place], [place] - [address] AT [facility] [address] [place] [address] Phone: [phone] Fax: [phone]   Patient notified that their request is being sent to the clinical staff for review and that they should receive a response within 2 business days.   Please advise at Mobile There is no such number on file (mobile).

## 2023-05-01 ENCOUNTER — Ambulatory Visit (INDEPENDENT_AMBULATORY_CARE_PROVIDER_SITE_OTHER): Payer: Medicare Other | Admitting: Adult Health

## 2023-05-01 ENCOUNTER — Encounter: Payer: Self-pay | Admitting: Adult Health

## 2023-05-01 VITALS — BP 130/80 | HR 78

## 2023-05-01 DIAGNOSIS — J849 Interstitial pulmonary disease, unspecified: Secondary | ICD-10-CM | POA: Diagnosis not present

## 2023-05-01 DIAGNOSIS — G4733 Obstructive sleep apnea (adult) (pediatric): Secondary | ICD-10-CM | POA: Diagnosis not present

## 2023-05-01 NOTE — Assessment & Plan Note (Addendum)
Interstitial lung disease changes on CT scan most likely IPF (probable UIP on high-resolution CT chest).  Declined antifibrotic therapy. Appears to be clinically stable.  Has follow-up CT chest in December to follow lung nodule and fibrosis. No perceived benefit with albuterol or budesonide nebulizer  Plan  Patient Instructions  Continue on CPAP At bedtime   Saline nasal rinses Twice daily  As needed   Saline nasal gel  Keep up good work .  Do not drive if sleepy.  Work on healthy weight.  Albuterol inhaler or neb As needed   Robitussin DM 1 tsp every 4hr as needed  Activity as tolerated  CT chest as planned in December.  Follow up Dr. Vassie Loll  in 6 months and As needed
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
## 2023-05-01 NOTE — Assessment & Plan Note (Signed)
Excellent control and compliance on nocturnal CPAP.  No changes  Plan  Patient Instructions  Continue on CPAP At bedtime   Saline nasal rinses Twice daily  As needed   Saline nasal gel  Keep up good work .  Do not drive if sleepy.  Work on healthy weight.  Albuterol inhaler or neb As needed   Robitussin DM 1 tsp every 4hr as needed  Activity as tolerated  CT chest as planned in December.  Follow up Dr. Vassie Loll  in 6 months and As needed

## 2023-05-01 NOTE — Patient Instructions (Addendum)
Continue on CPAP At bedtime   Saline nasal rinses Twice daily  As needed   Saline nasal gel  Keep up good work .  Do not drive if sleepy.  Work on healthy weight.  Albuterol inhaler or neb As needed   Robitussin DM 1 tsp every 4hr as needed  Activity as tolerated  CT chest as planned in December.  Follow up Dr. Vassie Loll  in 6 months and As needed

## 2023-05-30 DIAGNOSIS — H02831 Dermatochalasis of right upper eyelid: Secondary | ICD-10-CM | POA: Diagnosis not present

## 2023-05-30 DIAGNOSIS — H04123 Dry eye syndrome of bilateral lacrimal glands: Secondary | ICD-10-CM | POA: Diagnosis not present

## 2023-05-30 DIAGNOSIS — H02834 Dermatochalasis of left upper eyelid: Secondary | ICD-10-CM | POA: Diagnosis not present

## 2023-05-30 DIAGNOSIS — Z961 Presence of intraocular lens: Secondary | ICD-10-CM | POA: Diagnosis not present

## 2023-06-05 ENCOUNTER — Ambulatory Visit
Admission: RE | Admit: 2023-06-05 | Discharge: 2023-06-05 | Disposition: A | Discharge status: Home / Self Care | Payer: Medicare Other | Source: Ambulatory Visit | Attending: Adult Health | Admitting: Adult Health

## 2023-06-05 DIAGNOSIS — R911 Solitary pulmonary nodule: Secondary | ICD-10-CM

## 2023-06-05 DIAGNOSIS — I2510 Atherosclerotic heart disease of native coronary artery without angina pectoris: Secondary | ICD-10-CM | POA: Diagnosis not present

## 2023-06-05 DIAGNOSIS — J8410 Pulmonary fibrosis, unspecified: Secondary | ICD-10-CM | POA: Diagnosis not present

## 2023-06-05 DIAGNOSIS — J439 Emphysema, unspecified: Secondary | ICD-10-CM | POA: Diagnosis not present

## 2023-06-05 DIAGNOSIS — J849 Interstitial pulmonary disease, unspecified: Secondary | ICD-10-CM

## 2023-06-08 ENCOUNTER — Telehealth: Payer: Self-pay | Admitting: Adult Health

## 2023-06-08 NOTE — Telephone Encounter (Signed)
Patient would like a call when his CT results come back in.

## 2023-06-17 NOTE — Telephone Encounter (Signed)
Results will be provided by Tammy in results notes tab once they are available. Pt/spouse will then be called after that with the results.

## 2023-06-19 DIAGNOSIS — N401 Enlarged prostate with lower urinary tract symptoms: Secondary | ICD-10-CM | POA: Diagnosis not present

## 2023-06-19 DIAGNOSIS — N3942 Incontinence without sensory awareness: Secondary | ICD-10-CM | POA: Diagnosis not present

## 2023-06-19 DIAGNOSIS — N3011 Interstitial cystitis (chronic) with hematuria: Secondary | ICD-10-CM | POA: Diagnosis not present

## 2023-06-19 DIAGNOSIS — Z8546 Personal history of malignant neoplasm of prostate: Secondary | ICD-10-CM | POA: Diagnosis not present

## 2023-06-19 DIAGNOSIS — R3982 Chronic bladder pain: Secondary | ICD-10-CM | POA: Diagnosis not present

## 2023-06-21 ENCOUNTER — Telehealth: Payer: Self-pay | Admitting: Internal Medicine

## 2023-06-21 ENCOUNTER — Ambulatory Visit: Payer: Medicare Other | Admitting: Internal Medicine

## 2023-06-21 ENCOUNTER — Other Ambulatory Visit: Payer: Self-pay | Admitting: Internal Medicine

## 2023-06-21 DIAGNOSIS — I10 Essential (primary) hypertension: Secondary | ICD-10-CM

## 2023-06-21 NOTE — Telephone Encounter (Signed)
Patient needed to reschedule appointment. He scheduled for the first afternoon appointment on 07/25/23. He is not able to come in the morning due to transportation.   Prescription Request  06/21/2023  LOV: 12/20/2022  What is the name of the medication or equipment? amLODipine (NORVASC) 5 MG tablet  carvedilol (COREG CR) 20 MG 24 hr capsule  Have you contacted your pharmacy to request a refill? Yes   Which pharmacy would you like this sent to?  [facility] - [place], [place] - [address] [place] [address] Phone: [phone] Fax: [phone]    Patient notified that their request is being sent to the clinical staff for review and that they should receive a response within 2 business days.   Please advise at [facility] [phone]

## 2023-06-22 ENCOUNTER — Other Ambulatory Visit: Payer: Self-pay

## 2023-06-22 DIAGNOSIS — I10 Essential (primary) hypertension: Secondary | ICD-10-CM

## 2023-06-22 NOTE — Telephone Encounter (Signed)
Medication has already been refilled.

## 2023-06-23 NOTE — Progress Notes (Signed)
Called and spoke with patient, advised of results/recommendations per Tammy Parrett NP.  He verbalized understanding.  Nothing further needed.

## 2023-06-27 ENCOUNTER — Ambulatory Visit: Payer: Medicare Other | Admitting: Internal Medicine

## 2023-07-25 ENCOUNTER — Ambulatory Visit (INDEPENDENT_AMBULATORY_CARE_PROVIDER_SITE_OTHER): Payer: Medicare Other | Admitting: Internal Medicine

## 2023-07-25 ENCOUNTER — Encounter: Payer: Self-pay | Admitting: Internal Medicine

## 2023-07-25 VITALS — BP 134/70 | HR 70 | Temp 97.7°F | Ht 68.0 in | Wt 223.2 lb

## 2023-07-25 DIAGNOSIS — M1A09X0 Idiopathic chronic gout, multiple sites, without tophus (tophi): Secondary | ICD-10-CM | POA: Diagnosis not present

## 2023-07-25 DIAGNOSIS — J849 Interstitial pulmonary disease, unspecified: Secondary | ICD-10-CM

## 2023-07-25 DIAGNOSIS — K219 Gastro-esophageal reflux disease without esophagitis: Secondary | ICD-10-CM

## 2023-07-25 DIAGNOSIS — N1832 Chronic kidney disease, stage 3b: Secondary | ICD-10-CM | POA: Diagnosis not present

## 2023-07-25 DIAGNOSIS — R7303 Prediabetes: Secondary | ICD-10-CM

## 2023-07-25 DIAGNOSIS — I10 Essential (primary) hypertension: Secondary | ICD-10-CM

## 2023-07-25 LAB — BASIC METABOLIC PANEL
BUN: 25 mg/dL — ABNORMAL HIGH (ref 6–23)
CO2: 33 meq/L — ABNORMAL HIGH (ref 19–32)
Calcium: 9.6 mg/dL (ref 8.4–10.5)
Chloride: 101 meq/L (ref 96–112)
Creatinine, Ser: 1.09 mg/dL (ref 0.40–1.50)
GFR: 59.28 mL/min — ABNORMAL LOW (ref >60.00)
Glucose, Bld: 78 mg/dL (ref 70–99)
Potassium: 4.6 meq/L (ref 3.5–5.1)
Sodium: 140 meq/L (ref 135–145)

## 2023-07-25 LAB — CBC WITH DIFFERENTIAL/PLATELET
Basophils Absolute: 0 10*3/uL (ref 0.0–0.1)
Basophils Relative: 0.5 % (ref 0.0–3.0)
Eosinophils Absolute: 0.1 10*3/uL (ref 0.0–0.7)
Eosinophils Relative: 1.6 % (ref 0.0–5.0)
HCT: 41.7 % (ref 39.0–52.0)
Hemoglobin: 13.3 g/dL (ref 13.0–17.0)
Lymphocytes Relative: 28.7 % (ref 12.0–46.0)
Lymphs Abs: 1.7 10*3/uL (ref 0.7–4.0)
MCHC: 31.9 g/dL (ref 30.0–36.0)
MCV: 93.8 fL (ref 78.0–100.0)
Monocytes Absolute: 0.5 10*3/uL (ref 0.1–1.0)
Monocytes Relative: 8.1 % (ref 3.0–12.0)
Neutro Abs: 3.5 10*3/uL (ref 1.4–7.7)
Neutrophils Relative %: 61.1 % (ref 43.0–77.0)
Platelets: 314 10*3/uL (ref 150.0–400.0)
RBC: 4.45 Mil/uL (ref 4.22–5.81)
RDW: 13 % (ref 11.5–15.5)
WBC: 5.8 10*3/uL (ref 4.0–10.5)

## 2023-07-25 LAB — HEPATIC FUNCTION PANEL
ALT: 16 U/L (ref 0–53)
AST: 17 U/L (ref 0–37)
Albumin: 4.3 g/dL (ref 3.5–5.2)
Alkaline Phosphatase: 65 U/L (ref 39–117)
Bilirubin, Direct: 0 mg/dL (ref 0.0–0.3)
Total Bilirubin: 0.4 mg/dL (ref 0.2–1.2)
Total Protein: 7.4 g/dL (ref 6.0–8.3)

## 2023-07-25 LAB — HEMOGLOBIN A1C: Hgb A1c MFr Bld: 6.3 % (ref 4.6–6.5)

## 2023-07-25 LAB — URIC ACID: Uric Acid, Serum: 6 mg/dL (ref 4.0–7.8)

## 2023-07-25 NOTE — Progress Notes (Signed)
Subjective:  Patient ID: Jonathan Medina, male    DOB: 05-May-1930  Age: 89 y.o. MRN: [id number]  CC: Hypertension and Gastroesophageal Reflux   HPI Jonathan Medina presents for f/up -----  Discussed the use of AI scribe software for clinical note transcription with the patient, who gave verbal consent to proceed.  History of Present Illness   The patient, with a history of prostate cancer and subsequent bladder incontinence, recently experienced a respiratory illness. He reported a cough, which has since resolved, and denied any associated chest pain, shortness of breath, or dizziness. He has been managing his incontinence with pads and has not reported any significant distress, despite frequent urination interrupting his sleep.  The patient also expressed concerns about his lungs, recounting a previous incident where a nebulizer treatment caused a raw sensation in his mouth and throat. He has since discontinued the nebulizer. The recent respiratory illness lasted about a week, during which he experienced significant coughing but no hemoptysis. The illness has since improved, allowing the patient to sleep through the night.  The patient has been on long-term carvedilol and amlodipine for blood pressure control, which he reports as effective. He recently underwent a CT scan, presumably of the lungs, which he reports showed no concerning findings. He also received a flu shot this season.       Outpatient Medications Prior to Visit  Medication Sig Dispense Refill   amLODipine (NORVASC) 5 MG tablet TAKE 1 TABLET DAILY 90 tablet 0   carvedilol (COREG CR) 20 MG 24 hr capsule TAKE 1 CAPSULE EVERY MORNING 90 capsule 0   diclofenac Sodium (VOLTAREN) 1 % GEL Apply 4 g topically 4 (four) times daily. 100 g 4   oxyCODONE (OXY IR/ROXICODONE) 5 MG immediate release tablet Take 5 mg by mouth in the morning and at bedtime.     tamsulosin (FLOMAX) 0.4 MG CAPS capsule Take 1 capsule (0.4 mg total) by mouth  daily after breakfast. 90 capsule 0   albuterol (PROVENTIL) (2.5 MG/3ML) 0.083% nebulizer solution Take 3 mLs (2.5 mg total) by nebulization 2 (two) times daily. (Patient not taking: Reported on 07/25/2023) 75 mL 12   No facility-administered medications prior to visit.    ROS Review of Systems  Constitutional:  Positive for unexpected weight change (wt gain). Negative for appetite change, chills, diaphoresis and fatigue.  HENT:  Negative for trouble swallowing.   Eyes:  Negative for visual disturbance.  Respiratory:  Negative for cough, chest tightness, shortness of breath and wheezing.   Cardiovascular:  Positive for leg swelling. Negative for chest pain and palpitations.  Gastrointestinal: Negative.  Negative for abdominal pain, constipation, diarrhea, nausea and vomiting.  Genitourinary: Negative.  Negative for difficulty urinating.  Musculoskeletal: Negative.   Skin: Negative.   Neurological: Negative.  Negative for dizziness and weakness.  Hematological:  Negative for adenopathy. Does not bruise/bleed easily.  Psychiatric/Behavioral: Negative.      Objective:  BP 134/70 (BP Location: Left Arm, Patient Position: Sitting, Cuff Size: Normal)   Pulse 70   Temp 97.7 F (36.5 C) (Oral)   Ht 5\' 8"  (1.727 m)   Wt 223 lb 3.2 oz (101.2 kg)   SpO2 98%   BMI 33.94 kg/m   BP Readings from Last 3 Encounters:  07/25/23 134/70  05/01/23 130/80  12/20/22 124/78    Wt Readings from Last 3 Encounters:  07/25/23 223 lb 3.2 oz (101.2 kg)  12/20/22 219 lb (99.3 kg)  09/23/22 219 lb (99.3 kg)  Physical Exam Vitals reviewed.  Constitutional:      Appearance: Normal appearance. He is not ill-appearing.  HENT:     Mouth/Throat:     Mouth: Mucous membranes are moist.  Eyes:     General: No scleral icterus.    Conjunctiva/sclera: Conjunctivae normal.  Cardiovascular:     Rate and Rhythm: Normal rate and regular rhythm. Occasional Extrasystoles are present.    Heart sounds: No  murmur heard.    No friction rub. No gallop.     Comments: EKG- SR with SA, 71 bpm No LVH, Q waves, or ST/T wave changes  Pulmonary:     Effort: Pulmonary effort is normal.     Breath sounds: No stridor. No wheezing, rhonchi or rales.  Abdominal:     General: Abdomen is flat.     Palpations: There is no mass.     Tenderness: There is no abdominal tenderness. There is no guarding.     Hernia: No hernia is present.  Musculoskeletal:     Right lower leg: Edema (trace pitting) present.     Left lower leg: Edema (trace pitting) present.  Skin:    Findings: No lesion or rash.  Neurological:     General: No focal deficit present.     Mental Status: He is alert. Mental status is at baseline.  Psychiatric:        Mood and Affect: Mood normal.        Behavior: Behavior normal.     Lab Results  Component Value Date   WBC 5.8 07/25/2023   HGB 13.3 07/25/2023   HCT 41.7 07/25/2023   PLT 314.0 07/25/2023   GLUCOSE 78 07/25/2023   CHOL 208 (H) 12/20/2022   TRIG 232.0 (H) 12/20/2022   HDL 46.80 12/20/2022   LDLDIRECT 128.0 12/20/2022   LDLCALC 118 (H) 10/04/2021   ALT 16 07/25/2023   AST 17 07/25/2023   NA 140 07/25/2023   K 4.6 07/25/2023   CL 101 07/25/2023   CREATININE 1.09 07/25/2023   BUN 25 (H) 07/25/2023   CO2 33 (H) 07/25/2023   TSH 2.80 12/20/2022   PSA 0.54 07/17/2019   INR 0.9 08/07/2007   HGBA1C 6.3 07/25/2023    CT CHEST HIGH RESOLUTION Result Date: 06/18/2023 CLINICAL DATA:  Interstitial lung disease. EXAM: CT CHEST WITHOUT CONTRAST TECHNIQUE: Multidetector CT imaging of the chest was performed following the standard protocol without intravenous contrast. High resolution imaging of the lungs, as well as inspiratory and expiratory imaging, was performed. RADIATION DOSE REDUCTION: This exam was performed according to the departmental dose-optimization program which includes automated exposure control, adjustment of the mA and/or kV according to patient size and/or  use of iterative reconstruction technique. COMPARISON:  06/08/2022. FINDINGS: Cardiovascular: Atherosclerotic calcification of the aorta, aortic valve and coronary arteries. Enlarged pulmonic trunk and heart. No pericardial effusion. Mediastinum/Nodes: No pathologically enlarged mediastinal or axillary lymph nodes. Hilar regions are difficult to definitively evaluate without IV contrast. Esophagus is grossly unremarkable. Lungs/Pleura: Paraseptal emphysema. Peripheral and basilar predominant subpleural reticulation, ground-glass and traction bronchiolectasis, unchanged. Scattered postoperative scarring in the right hemithorax. Similar pleuroparenchymal scarring in the lateral segment right middle lobe. Pulmonary nodular densities measure up to 4 x 9 mm in the anterior left lower lobe (8/79), unchanged. No pleural fluid. Airway is unremarkable. No air trapping. Upper Abdomen: Small partially imaged low-attenuation lesion in the right kidney. No specific follow-up necessary. Calcified periportal lymph nodes. Visualized portions of the liver, gallbladder, adrenal glands, kidneys, spleen, pancreas, stomach  and bowel are otherwise grossly unremarkable. No upper abdominal adenopathy. Musculoskeletal: Degenerative changes in the spine. IMPRESSION: 1. Pulmonary parenchymal pattern of fibrosis, as detailed above, stable. Findings are categorized as probable UIP per consensus guidelines: Diagnosis of Idiopathic Pulmonary Fibrosis: An Official ATS/ERS/JRS/ALAT Clinical Practice Guideline. Am Rosezetta Schlatter Crit Care Med Vol 198, Iss 5, [id number], Mar 04 2017. 2. Scattered small pulmonary nodules, stable. Recommend attention on follow-up. 3. Aortic atherosclerosis (ICD10-I70.0). Coronary artery calcification. 4. Enlarged pulmonic trunk, indicative of pulmonary arterial hypertension. 5.  Emphysema (ICD10-J43.9). Electronically Signed   By: Leanna Battles M.D.   On: 06/18/2023 16:59    Assessment & Plan:  Essential hypertension-  His blood pressure is well-controlled.  EKG is negative for LVH. -     CBC with Differential/Platelet; Future -     Basic metabolic panel; Future  Stage 3b chronic kidney disease (HCC) - Will avoid nephrotoxic agents  -     Basic metabolic panel; Future  Gastroesophageal reflux disease without esophagitis -     CBC with Differential/Platelet; Future  Idiopathic chronic gout of multiple sites without tophus -     Uric acid; Future  Prediabetes -     Hemoglobin A1c; Future  ILD (interstitial lung disease) (HCC)- He is asymptomatic with this.  Morbid obesity (HCC) -     Hemoglobin A1c; Future -     Hepatic function panel; Future     Follow-up: Return in about 6 months (around 01/22/2024).  Sanda Linger, MD

## 2023-07-25 NOTE — Patient Instructions (Signed)
Hypertension, Adult High blood pressure (hypertension) is when the force of blood pumping through the arteries is too strong. The arteries are the blood vessels that carry blood from the heart throughout the body. Hypertension forces the heart to work harder to pump blood and may cause arteries to become narrow or stiff. Untreated or uncontrolled hypertension can lead to a heart attack, heart failure, a stroke, kidney disease, and other problems. A blood pressure reading consists of a higher number over a lower number. Ideally, your blood pressure should be below 120/80. The first ("top") number is called the systolic pressure. It is a measure of the pressure in your arteries as your heart beats. The second ("bottom") number is called the diastolic pressure. It is a measure of the pressure in your arteries as the heart relaxes. What are the causes? The exact cause of this condition is not known. There are some conditions that result in high blood pressure. What increases the risk? Certain factors may make you more likely to develop high blood pressure. Some of these risk factors are under your control, including: Smoking. Not getting enough exercise or physical activity. Being overweight. Having too much fat, sugar, calories, or salt (sodium) in your diet. Drinking too much alcohol. Other risk factors include: Having a personal history of heart disease, diabetes, high cholesterol, or kidney disease. Stress. Having a family history of high blood pressure and high cholesterol. Having obstructive sleep apnea. Age. The risk increases with age. What are the signs or symptoms? High blood pressure may not cause symptoms. Very high blood pressure (hypertensive crisis) may cause: Headache. Fast or irregular heartbeats (palpitations). Shortness of breath. Nosebleed. Nausea and vomiting. Vision changes. Severe chest pain, dizziness, and seizures. How is this diagnosed? This condition is diagnosed by  measuring your blood pressure while you are seated, with your arm resting on a flat surface, your legs uncrossed, and your feet flat on the floor. The cuff of the blood pressure monitor will be placed directly against the skin of your upper arm at the level of your heart. Blood pressure should be measured at least twice using the same arm. Certain conditions can cause a difference in blood pressure between your right and left arms. If you have a high blood pressure reading during one visit or you have normal blood pressure with other risk factors, you may be asked to: Return on a different day to have your blood pressure checked again. Monitor your blood pressure at home for 1 week or longer. If you are diagnosed with hypertension, you may have other blood or imaging tests to help your health care provider understand your overall risk for other conditions. How is this treated? This condition is treated by making healthy lifestyle changes, such as eating healthy foods, exercising more, and reducing your alcohol intake. You may be referred for counseling on a healthy diet and physical activity. Your health care provider may prescribe medicine if lifestyle changes are not enough to get your blood pressure under control and if: Your systolic blood pressure is above 130. Your diastolic blood pressure is above 80. Your personal target blood pressure may vary depending on your medical conditions, your age, and other factors. Follow these instructions at home: Eating and drinking  Eat a diet that is high in fiber and potassium, and low in sodium, added sugar, and fat. An example of this eating plan is called the DASH diet. DASH stands for Dietary Approaches to Stop Hypertension. To eat this way: Eat   plenty of fresh fruits and vegetables. Try to fill one half of your plate at each meal with fruits and vegetables. Eat whole grains, such as whole-wheat pasta, brown rice, or whole-grain bread. Fill about one  fourth of your plate with whole grains. Eat or drink low-fat dairy products, such as skim milk or low-fat yogurt. Avoid fatty cuts of meat, processed or cured meats, and poultry with skin. Fill about one fourth of your plate with lean proteins, such as fish, chicken without skin, beans, eggs, or tofu. Avoid pre-made and processed foods. These tend to be higher in sodium, added sugar, and fat. Reduce your daily sodium intake. Many people with hypertension should eat less than 1,500 mg of sodium a day. Do not drink alcohol if: Your health care provider tells you not to drink. You are pregnant, may be pregnant, or are planning to become pregnant. If you drink alcohol: Limit how much you have to: 0-1 drink a day for women. 0-2 drinks a day for men. Know how much alcohol is in your drink. In the [place] one drink equals one 12 oz bottle of beer (355 mL), one 5 oz glass of wine (148 mL), or one 1 oz glass of hard liquor (44 mL). Lifestyle  Work with your health care provider to maintain a healthy body weight or to lose weight. Ask what an ideal weight is for you. Get at least 30 minutes of exercise that causes your heart to beat faster (aerobic exercise) most days of the week. Activities may include walking, swimming, or biking. Include exercise to strengthen your muscles (resistance exercise), such as Pilates or lifting weights, as part of your weekly exercise routine. Try to do these types of exercises for 30 minutes at least 3 days a week. Do not use any products that contain nicotine or tobacco. These products include cigarettes, chewing tobacco, and vaping devices, such as e-cigarettes. If you need help quitting, ask your health care provider. Monitor your blood pressure at home as told by your health care provider. Keep all follow-up visits. This is important. Medicines Take over-the-counter and prescription medicines only as told by your health care provider. Follow directions carefully. Blood  pressure medicines must be taken as prescribed. Do not skip doses of blood pressure medicine. Doing this puts you at risk for problems and can make the medicine less effective. Ask your health care provider about side effects or reactions to medicines that you should watch for. Contact a health care provider if you: Think you are having a reaction to a medicine you are taking. Have headaches that keep coming back (recurring). Feel dizzy. Have swelling in your ankles. Have trouble with your vision. Get help right away if you: Develop a severe headache or confusion. Have unusual weakness or numbness. Feel faint. Have severe pain in your chest or abdomen. Vomit repeatedly. Have trouble breathing. These symptoms may be an emergency. Get help right away. Call 911. Do not wait to see if the symptoms will go away. Do not drive yourself to the hospital. Summary Hypertension is when the force of blood pumping through your arteries is too strong. If this condition is not controlled, it may put you at risk for serious complications. Your personal target blood pressure may vary depending on your medical conditions, your age, and other factors. For most people, a normal blood pressure is less than 120/80. Hypertension is treated with lifestyle changes, medicines, or a combination of both. Lifestyle changes include losing weight, eating a healthy,   low-sodium diet, exercising more, and limiting alcohol. This information is not intended to replace advice given to you by your health care provider. Make sure you discuss any questions you have with your health care provider. Document Revised: 04/27/2021 Document Reviewed: 04/27/2021 Elsevier Patient Education  2024 Elsevier Inc.  

## 2023-07-27 ENCOUNTER — Encounter: Payer: Self-pay | Admitting: Internal Medicine

## 2023-08-14 ENCOUNTER — Encounter: Payer: Self-pay | Admitting: Adult Health

## 2023-08-14 ENCOUNTER — Ambulatory Visit (INDEPENDENT_AMBULATORY_CARE_PROVIDER_SITE_OTHER): Payer: Medicare Other | Admitting: Adult Health

## 2023-08-14 VITALS — BP 130/70 | HR 74 | Temp 98.1°F | Ht 68.0 in | Wt 224.8 lb

## 2023-08-14 DIAGNOSIS — G4733 Obstructive sleep apnea (adult) (pediatric): Secondary | ICD-10-CM | POA: Diagnosis not present

## 2023-08-14 DIAGNOSIS — J849 Interstitial pulmonary disease, unspecified: Secondary | ICD-10-CM
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
## 2023-08-14 NOTE — Patient Instructions (Addendum)
 Continue on CPAP At bedtime   Saline nasal spray in am .  Saline nasal gel At bedtime   Add Flonase nasal 2 puffs daily (over the counter)  Keep up good work .  Do not drive if sleepy.  Work on healthy weight.  Albuterol  inhaler or neb As needed   Delsym 2 tsp Twice daily as needed  Activity as tolerated  Follow up Dr. Villa Greaser or Jeramy Dimmick NP  in 6 months and As needed

## 2023-08-14 NOTE — Assessment & Plan Note (Addendum)
 ILD-probable UIP on HRCT - remains at baseline-with dry cough. Declines antifibrotic therapy.  Clinically appears stable. HRCT chest 06/2023 stable without progressive changes.   Plan  Patient Instructions  Continue on CPAP At bedtime   Saline nasal spray in am .  Saline nasal gel At bedtime   Add Flonase nasal 2 puffs daily (over the counter)  Keep up good work .  Do not drive if sleepy.  Work on healthy weight.  Albuterol  inhaler or neb As needed   Delsym 2 tsp Twice daily as needed  Activity as tolerated  Follow up Dr. Villa Greaser or Atsushi Yom NP  in 6 months and As needed

## 2023-08-14 NOTE — Assessment & Plan Note (Signed)
 Excellent compliance and control on nocturnal CPAP.  Continue current settings.  Patient education given on CPAP  Plan  Patient Instructions  Continue on CPAP At bedtime   Saline nasal spray in am .  Saline nasal gel At bedtime   Add Flonase nasal 2 puffs daily (over the counter)  Keep up good work .  Do not drive if sleepy.  Work on healthy weight.  Albuterol  inhaler or neb As needed   Delsym 2 tsp Twice daily as needed  Activity as tolerated  Follow up Dr. Villa Greaser or Trica Usery NP  in 6 months and As needed

## 2023-09-11 DIAGNOSIS — Z8546 Personal history of malignant neoplasm of prostate: Secondary | ICD-10-CM | POA: Diagnosis not present

## 2023-09-11 LAB — PSA: PSA: 0.2

## 2023-09-18 DIAGNOSIS — N3942 Incontinence without sensory awareness: Secondary | ICD-10-CM | POA: Diagnosis not present

## 2023-09-18 DIAGNOSIS — R3982 Chronic bladder pain: Secondary | ICD-10-CM | POA: Diagnosis not present

## 2023-09-18 DIAGNOSIS — Z8546 Personal history of malignant neoplasm of prostate: Secondary | ICD-10-CM | POA: Diagnosis not present

## 2023-09-18 DIAGNOSIS — N3011 Interstitial cystitis (chronic) with hematuria: Secondary | ICD-10-CM | POA: Diagnosis not present

## 2023-09-28 ENCOUNTER — Other Ambulatory Visit: Payer: Self-pay | Admitting: Internal Medicine

## 2023-09-28 DIAGNOSIS — I10 Essential (primary) hypertension: Secondary | ICD-10-CM

## 2023-10-02 ENCOUNTER — Telehealth: Payer: Self-pay | Admitting: Internal Medicine

## 2023-10-02 DIAGNOSIS — I10 Essential (primary) hypertension: Secondary | ICD-10-CM

## 2023-10-02 NOTE — Telephone Encounter (Signed)
 Copied from CRM [id number]. Topic: Clinical - Medication Question >> Oct 02, 2023 11:50 AM Arley Phenix D wrote: Reason for CRM: Patient would like to request that the no refills be removed from the medications amLODipine (NORVASC) 5 MG tablet and carvedilol (COREG CR) 20 MG 24 hr capsule if possible. Patient would like the medication to automatically refill every 90 days.

## 2023-10-02 NOTE — Telephone Encounter (Signed)
 Duplicate request, medications sent today by provider. Cancelled reorder and closing encounter.

## 2023-10-02 NOTE — Telephone Encounter (Signed)
 Copied from CRM [id number]. Topic: Clinical - Medication Refill >> Oct 02, 2023 11:41 AM Arley Phenix D wrote: Most Recent Primary Care Visit:  Provider: Etta Grandchild  Department: [facility]  Visit Type: OFFICE VISIT  Date: 07/25/2023  Medication: amLODipine (NORVASC) 5 MG tablet, carvedilol (COREG CR) 20 MG 24 hr capsule  Has the patient contacted their pharmacy? Yes (Agent: If no, request that the patient contact the pharmacy for the refill. If patient does not wish to contact the pharmacy document the reason why and proceed with request.) (Agent: If yes, when and what did the pharmacy advise?) advised to contact the PCP  Is this the correct pharmacy for this prescription? Yes If no, delete pharmacy and type the correct one.  This is the patient's preferred pharmacy:  [facility] - [place], [place] - [address] [place] [address] Phone: [phone] Fax: [phone]   Has the prescription been filled recently? No  Is the patient out of the medication? Yes  Has the patient been seen for an appointment in the last year OR does the patient have an upcoming appointment? Yes  Can we respond through MyChart? No  Agent: Please be advised that Rx refills may take up to 3 business days. We ask that you follow-up with your pharmacy.

## 2023-12-20 DIAGNOSIS — N401 Enlarged prostate with lower urinary tract symptoms: Secondary | ICD-10-CM | POA: Diagnosis not present

## 2023-12-20 DIAGNOSIS — N3942 Incontinence without sensory awareness: Secondary | ICD-10-CM | POA: Diagnosis not present

## 2023-12-20 DIAGNOSIS — Z8546 Personal history of malignant neoplasm of prostate: Secondary | ICD-10-CM | POA: Diagnosis not present

## 2023-12-20 DIAGNOSIS — R351 Nocturia: Secondary | ICD-10-CM | POA: Diagnosis not present

## 2023-12-20 DIAGNOSIS — N3011 Interstitial cystitis (chronic) with hematuria: Secondary | ICD-10-CM | POA: Diagnosis not present

## 2024-01-17 ENCOUNTER — Ambulatory Visit (INDEPENDENT_AMBULATORY_CARE_PROVIDER_SITE_OTHER)

## 2024-01-17 VITALS — Ht 68.0 in | Wt 224.0 lb

## 2024-01-17 DIAGNOSIS — Z0000 Encounter for general adult medical examination without abnormal findings: Secondary | ICD-10-CM
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
## 2024-01-17 NOTE — Patient Instructions (Addendum)
 Jonathan Medina , Thank you for taking time out of your busy schedule to complete your Annual Wellness Visit with me. I enjoyed our conversation and look forward to speaking with you again next year. I, as well as your care team,  appreciate your ongoing commitment to your health goals. Please review the following plan we discussed and let me know if I can assist you in the future. Your Game plan/ To Do List   Follow up Visits: Next Medicare AWV with our clinical staff: patient prefers a telephone visit.     Have you seen your provider in the last 6 months (3 months if uncontrolled diabetes)? Yes Next Office Visit with your provider: 01/22/2024.  Clinician Recommendations:  Aim for 30 minutes of exercise or brisk walking, 6-8 glasses of water , and 5 servings of fruits and vegetables each day. Keep up the good work.      This is a list of the screening recommended for you and due dates:  Health Maintenance  Topic Date Due   COVID-19 Vaccine (8 - 2024-25 season) 05/18/2023   Flu Shot  02/02/2024   DTaP/Tdap/Td vaccine (4 - Td or Tdap) 04/16/2024   Medicare Annual Wellness Visit  01/16/2025   Pneumococcal Vaccine for age over 35  Completed   Zoster (Shingles) Vaccine  Completed   Hepatitis B Vaccine  Aged Out   HPV Vaccine  Aged Out   Meningitis B Vaccine  Aged Out    Advanced directives: (Declined) Advance directive discussed with you today. Even though you declined this today, please call our office should you change your mind, and we can give you the proper paperwork for you to fill out. Advance Care Planning is important because it:  [x]  Makes sure you receive the medical care that is consistent with your values, goals, and preferences  [x]  It provides guidance to your family and loved ones and reduces their decisional burden about whether or not they are making the right decisions based on your wishes.  Follow the link provided in your after visit summary or read over the paperwork we  have mailed to you to help you started getting your Advance Directives in place. If you need assistance in completing these, please reach out to us  so that we can help you!  See attachments for Preventive Care and Fall Prevention Tips.   Managing Pain Without Opioids Opioids are strong medicines used to treat moderate to severe pain. For some people, especially those who have long-term (chronic) pain, opioids may not be the best choice for pain management due to: Side effects like nausea, constipation, and sleepiness. The risk of addiction (opioid use disorder). The longer you take opioids, the greater your risk of addiction. Pain that lasts for more than 3 months is called chronic pain. Managing chronic pain usually requires more than one approach and is often provided by a team of health care providers working together (multidisciplinary approach). Pain management may be done at a pain management center or pain clinic. How to manage pain without the use of opioids Use non-opioid medicines Non-opioid medicines for pain may include: Over-the-counter or prescription non-steroidal anti-inflammatory drugs (NSAIDs). These may be the first medicines used for pain. They work well for muscle and bone pain, and they reduce swelling. Acetaminophen . This over-the-counter medicine may work well for milder pain but not swelling. Antidepressants. These may be used to treat chronic pain. A certain type of antidepressant (tricyclics) is often used. These medicines are given in lower doses  for pain than when used for depression. Anticonvulsants. These are usually used to treat seizures but may also reduce nerve (neuropathic) pain. Muscle relaxants. These relieve pain caused by sudden muscle tightening (spasms). You may also use a pain medicine that is applied to the skin as a patch, cream, or gel (topical analgesic), such as a numbing medicine. These may cause fewer side effects than medicines taken by mouth. Do  certain therapies as directed Some therapies can help with pain management. They include: Physical therapy. You will do exercises to gain strength and flexibility. A physical therapist may teach you exercises to move and stretch parts of your body that are weak, stiff, or painful. You can learn these exercises at physical therapy visits and practice them at home. Physical therapy may also involve: Massage. Heat wraps or applying heat or cold to affected areas. Electrical signals that interrupt pain signals (transcutaneous electrical nerve stimulation, TENS). Weak lasers that reduce pain and swelling (low-level laser therapy). Signals from your body that help you learn to regulate pain (biofeedback). Occupational therapy. This helps you to learn ways to function at home and work with less pain. Recreational therapy. This involves trying new activities or hobbies, such as a physical activity or drawing. Mental health therapy, including: Cognitive behavioral therapy (CBT). This helps you learn coping skills for dealing with pain. Acceptance and commitment therapy (ACT) to change the way you think and react to pain. Relaxation therapies, including muscle relaxation exercises and mindfulness-based stress reduction. Pain management counseling. This may be individual, family, or group counseling.  Receive medical treatments Medical treatments for pain management include: Nerve block injections. These may include a pain blocker and anti-inflammatory medicines. You may have injections: Near the spine to relieve chronic back or neck pain. Into joints to relieve back or joint pain. Into nerve areas that supply a painful area to relieve body pain. Into muscles (trigger point injections) to relieve some painful muscle conditions. A medical device placed near your spine to help block pain signals and relieve nerve pain or chronic back pain (spinal cord stimulation device). Acupuncture. Follow these  instructions at home Medicines Take over-the-counter and prescription medicines only as told by your health care provider. If you are taking pain medicine, ask your health care providers about possible side effects to watch out for. Do not drive or use heavy machinery while taking prescription opioid pain medicine. Lifestyle  Do not use drugs or alcohol to reduce pain. If you drink alcohol, limit how much you have to: 0-1 drink a day for women who are not pregnant. 0-2 drinks a day for men. Know how much alcohol is in a drink. In the [place] one drink equals one 12 oz bottle of beer (355 mL), one 5 oz glass of wine (148 mL), or one 1 oz glass of hard liquor (44 mL). Do not use any products that contain nicotine or tobacco. These products include cigarettes, chewing tobacco, and vaping devices, such as e-cigarettes. If you need help quitting, ask your health care provider. Eat a healthy diet and maintain a healthy weight. Poor diet and excess weight may make pain worse. Eat foods that are high in fiber. These include fresh fruits and vegetables, whole grains, and beans. Limit foods that are high in fat and processed sugars, such as fried and sweet foods. Exercise regularly. Exercise lowers stress and may help relieve pain. Ask your health care provider what activities and exercises are safe for you. If your health care provider approves,  join an exercise class that combines movement and stress reduction. Examples include yoga and tai chi. Get enough sleep. Lack of sleep may make pain worse. Lower stress as much as possible. Practice stress reduction techniques as told by your therapist. General instructions Work with all your pain management providers to find the treatments that work best for you. You are an important member of your pain management team. There are many things you can do to reduce pain on your own. Consider joining an online or in-person support group for people who have chronic  pain. Keep all follow-up visits. This is important. Where to find more information You can find more information about managing pain without opioids from: American Academy of Pain Medicine: painmed.org Institute for Chronic Pain: instituteforchronicpain.org American Chronic Pain Association: theacpa.org Contact a health care provider if: You have side effects from pain medicine. Your pain gets worse or does not get better with treatments or home therapy. You are struggling with anxiety or depression. Summary Many types of pain can be managed without opioids. Chronic pain may respond better to pain management without opioids. Pain is best managed when you and a team of health care providers work together. Pain management without opioids may include non-opioid medicines, medical treatments, physical therapy, mental health therapy, and lifestyle changes. Tell your health care providers if your pain gets worse or is not being managed well enough. This information is not intended to replace advice given to you by your health care provider. Make sure you discuss any questions you have with your health care provider. Document Revised: 09/30/2020 Document Reviewed: 09/30/2020 Elsevier Patient Education  2024 [employer].

## 2024-01-22 ENCOUNTER — Encounter: Payer: Self-pay | Admitting: Internal Medicine

## 2024-01-22 ENCOUNTER — Ambulatory Visit (INDEPENDENT_AMBULATORY_CARE_PROVIDER_SITE_OTHER): Payer: Medicare Other | Admitting: Internal Medicine

## 2024-01-22 VITALS — BP 130/80 | HR 63 | Temp 98.5°F | Ht 68.0 in | Wt 215.2 lb

## 2024-01-22 DIAGNOSIS — R7303 Prediabetes: Secondary | ICD-10-CM | POA: Diagnosis not present

## 2024-01-22 DIAGNOSIS — M1A09X0 Idiopathic chronic gout, multiple sites, without tophus (tophi): Secondary | ICD-10-CM | POA: Diagnosis not present

## 2024-01-22 DIAGNOSIS — K219 Gastro-esophageal reflux disease without esophagitis: Secondary | ICD-10-CM

## 2024-01-22 DIAGNOSIS — N1832 Chronic kidney disease, stage 3b: Secondary | ICD-10-CM

## 2024-01-22 DIAGNOSIS — I10 Essential (primary) hypertension: Secondary | ICD-10-CM | POA: Diagnosis not present

## 2024-01-22 DIAGNOSIS — E785 Hyperlipidemia, unspecified: Secondary | ICD-10-CM | POA: Diagnosis not present

## 2024-01-22 LAB — TSH: TSH: 2.1 u[IU]/mL (ref 0.35–5.50)

## 2024-01-22 LAB — CBC WITH DIFFERENTIAL/PLATELET
Basophils Absolute: 0 K/uL (ref 0.0–0.1)
Basophils Relative: 0.6 % (ref 0.0–3.0)
Eosinophils Absolute: 0.1 K/uL (ref 0.0–0.7)
Eosinophils Relative: 1.7 % (ref 0.0–5.0)
HCT: 40.3 % (ref 39.0–52.0)
Hemoglobin: 13.4 g/dL (ref 13.0–17.0)
Lymphocytes Relative: 37.4 % (ref 12.0–46.0)
Lymphs Abs: 2.1 K/uL (ref 0.7–4.0)
MCHC: 33.3 g/dL (ref 30.0–36.0)
MCV: 91.3 fl (ref 78.0–100.0)
Monocytes Absolute: 0.5 K/uL (ref 0.1–1.0)
Monocytes Relative: 9.6 % (ref 3.0–12.0)
Neutro Abs: 2.8 K/uL (ref 1.4–7.7)
Neutrophils Relative %: 50.7 % (ref 43.0–77.0)
Platelets: 248 K/uL (ref 150.0–400.0)
RBC: 4.41 Mil/uL (ref 4.22–5.81)
RDW: 13.6 % (ref 11.5–15.5)
WBC: 5.6 K/uL (ref 4.0–10.5)

## 2024-01-22 LAB — BASIC METABOLIC PANEL WITH GFR
BUN: 24 mg/dL — ABNORMAL HIGH (ref 6–23)
CO2: 29 meq/L (ref 19–32)
Calcium: 9.3 mg/dL (ref 8.4–10.5)
Chloride: 103 meq/L (ref 96–112)
Creatinine, Ser: 1.07 mg/dL (ref 0.40–1.50)
GFR: 60.4 mL/min (ref >60.00)
Glucose, Bld: 80 mg/dL (ref 70–99)
Potassium: 4.2 meq/L (ref 3.5–5.1)
Sodium: 139 meq/L (ref 135–145)

## 2024-01-22 LAB — LIPID PANEL
Cholesterol: 226 mg/dL — ABNORMAL HIGH (ref 0–200)
HDL: 50.1 mg/dL (ref >39.00)
LDL Cholesterol: 122 mg/dL — ABNORMAL HIGH (ref 0–99)
NonHDL: 175.45
Total CHOL/HDL Ratio: 5
Triglycerides: 266 mg/dL — ABNORMAL HIGH (ref 0.0–149.0)
VLDL: 53.2 mg/dL — ABNORMAL HIGH (ref 0.0–40.0)

## 2024-01-22 LAB — URIC ACID: Uric Acid, Serum: 5.6 mg/dL (ref 4.0–7.8)

## 2024-01-22 LAB — HEMOGLOBIN A1C: Hgb A1c MFr Bld: 6.1 % (ref 4.6–6.5)

## 2024-01-22 NOTE — Patient Instructions (Signed)
 Hypertension, Adult High blood pressure (hypertension) is when the force of blood pumping through the arteries is too strong. The arteries are the blood vessels that carry blood from the heart throughout the body. Hypertension forces the heart to work harder to pump blood and may cause arteries to become narrow or stiff. Untreated or uncontrolled hypertension can lead to a heart attack, heart failure, a stroke, kidney disease, and other problems. A blood pressure reading consists of a higher number over a lower number. Ideally, your blood pressure should be below 120/80. The first ("top") number is called the systolic pressure. It is a measure of the pressure in your arteries as your heart beats. The second ("bottom") number is called the diastolic pressure. It is a measure of the pressure in your arteries as the heart relaxes. What are the causes? The exact cause of this condition is not known. There are some conditions that result in high blood pressure. What increases the risk? Certain factors may make you more likely to develop high blood pressure. Some of these risk factors are under your control, including: Smoking. Not getting enough exercise or physical activity. Being overweight. Having too much fat, sugar, calories, or salt (sodium) in your diet. Drinking too much alcohol. Other risk factors include: Having a personal history of heart disease, diabetes, high cholesterol, or kidney disease. Stress. Having a family history of high blood pressure and high cholesterol. Having obstructive sleep apnea. Age. The risk increases with age. What are the signs or symptoms? High blood pressure may not cause symptoms. Very high blood pressure (hypertensive crisis) may cause: Headache. Fast or irregular heartbeats (palpitations). Shortness of breath. Nosebleed. Nausea and vomiting. Vision changes. Severe chest pain, dizziness, and seizures. How is this diagnosed? This condition is diagnosed by  measuring your blood pressure while you are seated, with your arm resting on a flat surface, your legs uncrossed, and your feet flat on the floor. The cuff of the blood pressure monitor will be placed directly against the skin of your upper arm at the level of your heart. Blood pressure should be measured at least twice using the same arm. Certain conditions can cause a difference in blood pressure between your right and left arms. If you have a high blood pressure reading during one visit or you have normal blood pressure with other risk factors, you may be asked to: Return on a different day to have your blood pressure checked again. Monitor your blood pressure at home for 1 week or longer. If you are diagnosed with hypertension, you may have other blood or imaging tests to help your health care provider understand your overall risk for other conditions. How is this treated? This condition is treated by making healthy lifestyle changes, such as eating healthy foods, exercising more, and reducing your alcohol intake. You may be referred for counseling on a healthy diet and physical activity. Your health care provider may prescribe medicine if lifestyle changes are not enough to get your blood pressure under control and if: Your systolic blood pressure is above 130. Your diastolic blood pressure is above 80. Your personal target blood pressure may vary depending on your medical conditions, your age, and other factors. Follow these instructions at home: Eating and drinking  Eat a diet that is high in fiber and potassium, and low in sodium, added sugar, and fat. An example of this eating plan is called the DASH diet. DASH stands for Dietary Approaches to Stop Hypertension. To eat this way: Eat  plenty of fresh fruits and vegetables. Try to fill one half of your plate at each meal with fruits and vegetables. Eat whole grains, such as whole-wheat pasta, brown rice, or whole-grain bread. Fill about one  fourth of your plate with whole grains. Eat or drink low-fat dairy products, such as skim milk or low-fat yogurt. Avoid fatty cuts of meat, processed or cured meats, and poultry with skin. Fill about one fourth of your plate with lean proteins, such as fish, chicken without skin, beans, eggs, or tofu. Avoid pre-made and processed foods. These tend to be higher in sodium, added sugar, and fat. Reduce your daily sodium intake. Many people with hypertension should eat less than 1,500 mg of sodium a day. Do not drink alcohol if: Your health care provider tells you not to drink. You are pregnant, may be pregnant, or are planning to become pregnant. If you drink alcohol: Limit how much you have to: 0-1 drink a day for women. 0-2 drinks a day for men. Know how much alcohol is in your drink. In the [place] one drink equals one 12 oz bottle of beer (355 mL), one 5 oz glass of wine (148 mL), or one 1 oz glass of hard liquor (44 mL). Lifestyle  Work with your health care provider to maintain a healthy body weight or to lose weight. Ask what an ideal weight is for you. Get at least 30 minutes of exercise that causes your heart to beat faster (aerobic exercise) most days of the week. Activities may include walking, swimming, or biking. Include exercise to strengthen your muscles (resistance exercise), such as Pilates or lifting weights, as part of your weekly exercise routine. Try to do these types of exercises for 30 minutes at least 3 days a week. Do not use any products that contain nicotine or tobacco. These products include cigarettes, chewing tobacco, and vaping devices, such as e-cigarettes. If you need help quitting, ask your health care provider. Monitor your blood pressure at home as told by your health care provider. Keep all follow-up visits. This is important. Medicines Take over-the-counter and prescription medicines only as told by your health care provider. Follow directions carefully. Blood  pressure medicines must be taken as prescribed. Do not skip doses of blood pressure medicine. Doing this puts you at risk for problems and can make the medicine less effective. Ask your health care provider about side effects or reactions to medicines that you should watch for. Contact a health care provider if you: Think you are having a reaction to a medicine you are taking. Have headaches that keep coming back (recurring). Feel dizzy. Have swelling in your ankles. Have trouble with your vision. Get help right away if you: Develop a severe headache or confusion. Have unusual weakness or numbness. Feel faint. Have severe pain in your chest or abdomen. Vomit repeatedly. Have trouble breathing. These symptoms may be an emergency. Get help right away. Call 911. Do not wait to see if the symptoms will go away. Do not drive yourself to the hospital. Summary Hypertension is when the force of blood pumping through your arteries is too strong. If this condition is not controlled, it may put you at risk for serious complications. Your personal target blood pressure may vary depending on your medical conditions, your age, and other factors. For most people, a normal blood pressure is less than 120/80. Hypertension is treated with lifestyle changes, medicines, or a combination of both. Lifestyle changes include losing weight, eating a healthy,  low-sodium diet, exercising more, and limiting alcohol. This information is not intended to replace advice given to you by your health care provider. Make sure you discuss any questions you have with your health care provider. Document Revised: 04/27/2021 Document Reviewed: 04/27/2021 Elsevier Patient Education  2024 [employer].

## 2024-01-22 NOTE — Progress Notes (Unsigned)
 Subjective:  Patient ID: Jonathan Medina, male    DOB: [birth year]  Age: 89 y.o. MRN: [id number]  CC: Medical Management of Chronic Issues (6 month follow up. No Concerns. )   HPI Jonathan Medina presents for f/up ----  Discussed the use of AI scribe software for clinical note transcription with the patient, who gave verbal consent to proceed.  History of Present Illness Jonathan Medina is a 89 year old male who presents with sleep disturbances and bladder problems.  He experiences significant sleep disturbances primarily due to frequent nocturnal awakenings to urinate. He is able to sleep for about three hours at a time, which he considers a good night. He has a history of prostate issues that have contributed to his bladder problems, and he previously had difficulties with catheter use. No blood in urine.  Persistent aching is present all the time. He takes a 'little pill' for the pain, but it does not provide much relief. He has attempted to stay active by walking around the block, approximately half a mile, but has since stopped due to his symptoms.  He underwent lung surgery in the past, which has resulted in mucus buildup in his throat. He is managing this issue but did not specify the treatment. No palpitations, dizziness, lightheadedness, chest pain, or shortness of breath.  He has not had any gout attacks since 2003. He feels nervous due to not being able to go out as much since the passing of his son-in-law, who used to take him places. He reports a recent episode of feeling depressed, which he attributes to his son-in-law's death, but does not feel it requires treatment.  He is trying to lose weight as he feels uncomfortable carrying extra weight. His wife is being treated for diabetes, and he is cautious about his own sugar intake.      Outpatient Medications Prior to Visit  Medication Sig Dispense Refill   albuterol  (PROVENTIL ) (2.5 MG/3ML) 0.083% nebulizer solution Take 3  mLs (2.5 mg total) by nebulization 2 (two) times daily. 75 mL 12   amLODipine  (NORVASC ) 5 MG tablet TAKE 1 TABLET DAILY 90 tablet 3   carvedilol  (COREG  CR) 20 MG 24 hr capsule TAKE 1 CAPSULE EVERY MORNING 90 capsule 3   diclofenac  Sodium (VOLTAREN ) 1 % GEL Apply 4 g topically 4 (four) times daily. 100 g 4   olopatadine (PATANOL) 0.1 % ophthalmic solution Place 1 drop into both eyes daily.     oxyCODONE  (OXY IR/ROXICODONE ) 5 MG immediate release tablet Take 5 mg by mouth in the morning and at bedtime.     tamsulosin  (FLOMAX ) 0.4 MG CAPS capsule Take 1 capsule (0.4 mg total) by mouth daily after breakfast. 90 capsule 0   No facility-administered medications prior to visit.    ROS Review of Systems  Objective:  BP 130/80 (BP Location: Left Arm, Patient Position: Sitting, Cuff Size: Normal)   Pulse 63   Temp 98.5 F (36.9 C) (Oral)   Ht 5' 8 (1.727 m)   Wt 215 lb 3.2 oz (97.6 kg)   SpO2 96%   BMI 32.72 kg/m   BP Readings from Last 3 Encounters:  01/22/24 130/80  08/14/23 130/70  07/25/23 134/70    Wt Readings from Last 3 Encounters:  01/22/24 215 lb 3.2 oz (97.6 kg)  01/17/24 224 lb (101.6 kg)  08/14/23 224 lb 12.8 oz (102 kg)    Physical Exam Vitals reviewed.  Constitutional:      Appearance: Normal  appearance.  HENT:     Nose: Nose normal.     Mouth/Throat:     Mouth: Mucous membranes are moist.  Eyes:     General: No scleral icterus.    Conjunctiva/sclera: Conjunctivae normal.  Cardiovascular:     Rate and Rhythm: Normal rate and regular rhythm. Occasional Extrasystoles are present.    Pulses: Normal pulses.     Heart sounds: No murmur heard.    No friction rub. No gallop.  Pulmonary:     Effort: Pulmonary effort is normal.     Breath sounds: No stridor. No wheezing, rhonchi or rales.  Abdominal:     General: Abdomen is protuberant. There is no distension.     Palpations: There is no hepatomegaly, splenomegaly or mass.     Tenderness: There is no abdominal  tenderness. There is no guarding.     Hernia: No hernia is present.  Musculoskeletal:        General: Normal range of motion.     Right lower leg: No edema.     Left lower leg: No edema.  Skin:    General: Skin is warm and dry.  Neurological:     General: No focal deficit present.     Mental Status: He is alert.  Psychiatric:        Mood and Affect: Mood normal.        Behavior: Behavior normal.     Lab Results  Component Value Date   WBC 5.8 07/25/2023   HGB 13.3 07/25/2023   HCT 41.7 07/25/2023   PLT 314.0 07/25/2023   GLUCOSE 78 07/25/2023   CHOL 208 (H) 12/20/2022   TRIG 232.0 (H) 12/20/2022   HDL 46.80 12/20/2022   LDLDIRECT 128.0 12/20/2022   LDLCALC 118 (H) 10/04/2021   ALT 16 07/25/2023   AST 17 07/25/2023   NA 140 07/25/2023   K 4.6 07/25/2023   CL 101 07/25/2023   CREATININE 1.09 07/25/2023   BUN 25 (H) 07/25/2023   CO2 33 (H) 07/25/2023   TSH 2.80 12/20/2022   PSA 0.2 09/11/2023   INR 0.9 08/07/2007   HGBA1C 6.3 07/25/2023    CT CHEST HIGH RESOLUTION Result Date: 06/18/2023 CLINICAL DATA:  Interstitial lung disease. EXAM: CT CHEST WITHOUT CONTRAST TECHNIQUE: Multidetector CT imaging of the chest was performed following the standard protocol without intravenous contrast. High resolution imaging of the lungs, as well as inspiratory and expiratory imaging, was performed. RADIATION DOSE REDUCTION: This exam was performed according to the departmental dose-optimization program which includes automated exposure control, adjustment of the mA and/or kV according to patient size and/or use of iterative reconstruction technique. COMPARISON:  06/08/2022. FINDINGS: Cardiovascular: Atherosclerotic calcification of the aorta, aortic valve and coronary arteries. Enlarged pulmonic trunk and heart. No pericardial effusion. Mediastinum/Nodes: No pathologically enlarged mediastinal or axillary lymph nodes. Hilar regions are difficult to definitively evaluate without IV contrast.  Esophagus is grossly unremarkable. Lungs/Pleura: Paraseptal emphysema. Peripheral and basilar predominant subpleural reticulation, ground-glass and traction bronchiolectasis, unchanged. Scattered postoperative scarring in the right hemithorax. Similar pleuroparenchymal scarring in the lateral segment right middle lobe. Pulmonary nodular densities measure up to 4 x 9 mm in the anterior left lower lobe (8/79), unchanged. No pleural fluid. Airway is unremarkable. No air trapping. Upper Abdomen: Small partially imaged low-attenuation lesion in the right kidney. No specific follow-up necessary. Calcified periportal lymph nodes. Visualized portions of the liver, gallbladder, adrenal glands, kidneys, spleen, pancreas, stomach and bowel are otherwise grossly unremarkable. No upper abdominal adenopathy. Musculoskeletal:  Degenerative changes in the spine. IMPRESSION: 1. Pulmonary parenchymal pattern of fibrosis, as detailed above, stable. Findings are categorized as probable UIP per consensus guidelines: Diagnosis of Idiopathic Pulmonary Fibrosis: An Official ATS/ERS/JRS/ALAT Clinical Practice Guideline. Am JINNY Honey Crit Care Med Vol 198, Iss 5, [id number], Mar 04 2017. 2. Scattered small pulmonary nodules, stable. Recommend attention on follow-up. 3. Aortic atherosclerosis (ICD10-I70.0). Coronary artery calcification. 4. Enlarged pulmonic trunk, indicative of pulmonary arterial hypertension. 5.  Emphysema (ICD10-J43.9). Electronically Signed   By: Newell Eke M.D.   On: 06/18/2023 16:59    Assessment & Plan:  Stage 3b chronic kidney disease (HCC) -     Basic metabolic panel with GFR; Future  Gastroesophageal reflux disease without esophagitis -     CBC with Differential/Platelet; Future  Prediabetes -     Hemoglobin A1c; Future  Essential hypertension -     Basic metabolic panel with GFR; Future -     CBC with Differential/Platelet; Future -     TSH; Future  Hyperlipidemia with target LDL less than 130 -      Lipid panel; Future -     TSH; Future  Idiopathic chronic gout of multiple sites without tophus -     Uric acid; Future     Follow-up: No follow-ups on file.  Debby Molt, MD

## 2024-01-26 ENCOUNTER — Ambulatory Visit: Payer: Self-pay | Admitting: Internal Medicine

## 2024-02-14 ENCOUNTER — Telehealth: Payer: Self-pay | Admitting: *Deleted

## 2024-02-14 NOTE — Telephone Encounter (Signed)
 Copied from CRM [id number]. Topic: Clinical - Order For Equipment >> Feb 12, 2024  3:53 PM Jonathan Medina wrote: Reason for CRM: Pt stated he contacted [employer] to replace his CPAP machine on 7/31, but he was recently told that he needed to contact his pulm provider to place an order for a new CPAP machine. Pt sees Dr. Jude and NP Madelin Stank for his pulm needs, and his most recent visits have been with NP Tammy Parrett at the [facility]. Pt stated the supplier's name for the CPAP is [employer]. Pt's phone number is [phone] ok to leave a vm. Pt wants a phone call to have an update on when the new cpap machine will arrive or if he needs an appt. >> Feb 14, 2024 12:26 PM Rozanna G wrote: PT IS CALLING BACK ABOUT HIS CPAP MACHINE, HE WOULD LIKE FOR SOMEONE TO CONTACT HIM ABOUT HIS MACHINCE STAED THE MESSAGE [employer] EXCEEDING COMING UP SINCE TWO WEEKS AGO, AND HE WOULD LIKE A CALL ABOUT WHAT HIS NEXT STEPS ARE TO GETTING THIS FIXED. THANKS   Called and spoke with the pt  He is needing order to [facility] for new CPAP, as his has exceeded it's life expectancy. Tammy, please advise if ok to place order. I copied DL for your review.

## 2024-02-15 NOTE — Telephone Encounter (Signed)
 Can [facility] please tell us  if ov is needed or is it ok to go ahead and order new CPAP now? Thank you!

## 2024-02-15 NOTE — Telephone Encounter (Signed)
 I am glad to order new CPAP. He was seen in Feb, do they need an ov for me to order . If not send DME order for new CPAP  Can work in my schedule next week if needs ov , can double book   Rx CPAP 10 cm H2O.

## 2024-02-16 NOTE — Telephone Encounter (Signed)
 Yes, pt will need to an appointment

## 2024-02-19 ENCOUNTER — Ambulatory Visit: Payer: Self-pay | Admitting: Adult Health

## 2024-02-19 ENCOUNTER — Telehealth: Payer: Self-pay

## 2024-02-19 NOTE — Telephone Encounter (Signed)
 FYI Only or Action Required?: FYI only for provider.  Patient is followed in Pulmonology for ILD, last seen on 08/14/2023 by Parrett, Madelin RAMAN, NP.  Called Nurse Triage reporting Shortness of Breath.  Symptoms began several weeks ago.  Interventions attempted: OTC medications: robitussin.  Symptoms are: unchanged.  Triage Disposition: See Physician Within 24 Hours  Patient/caregiver understands and will follow disposition?: Yes       Copied from CRM #[id number]. Topic: Clinical - Red Word Triage >> Feb 19, 2024  3:20 PM Devaughn RAMAN wrote: Red Word that prompted transfer to Nurse Triage: shortness of breath   ----------------------------------------------------------------------- From previous Reason for Contact - Scheduling: Patient/patient representative is calling to schedule an appointment. Refer to attachments for appointment information. Reason for Disposition  Continuous (nonstop) coughing interferes with work, school, or sleeping  Answer Assessment - Initial Assessment Questions E2C2 Pulmonary Triage - Initial Assessment Questions Chief Complaint (e.g., cough, sob, wheezing, fever, chills, sweat or additional symptoms) *Go to specific symptom protocol after initial questions. SOB, productive yellow/white coughing spells  that make him lose his breath Of note, triager can hear spouse in background  How long have symptoms been present? 4 weeks  Have you tested for COVID or Flu? Note: If not, ask patient if a home test can be taken. If so, instruct patient to call back for positive results. No, does not have  MEDICINES:   Have you used any OTC meds to help with symptoms? Yes If yes, ask What medications? robitussin  Have you used your inhalers/maintenance medication? No If yes, What medications? N/a  If inhaler, ask How many puffs and how often? Note: Review instructions on medication in the chart. N/a  OXYGEN: Do you wear supplemental oxygen?  No If yes, How many liters are you supposed to use? Endorses wearing CPAP  Do you monitor your oxygen levels? Yes If yes, What is your reading (oxygen level) today? 98  What is your usual oxygen saturation reading?  (Note: Pulmonary O2 sats should be 90% or greater) N/a      1. RESPIRATORY STATUS: Describe your breathing? (e.g., wheezing, shortness of breath, unable to speak, severe coughing)      See above 2. ONSET: When did this breathing problem begin?      See above 3. PATTERN Does the difficult breathing come and go, or has it been constant since it started?      intermittent 4. SEVERITY: How bad is your breathing? (e.g., mild, moderate, severe)      Mild-moderate Triager does not appreciate audible SOB/wheezing during call. Pt is speaking in full sentences.  5. RECURRENT SYMPTOM: Have you had difficulty breathing before? If Yes, ask: When was the last time? and What happened that time?      no 6. CARDIAC HISTORY: Do you have any history of heart disease? (e.g., heart attack, angina, bypass surgery, angioplasty)      denies 7. LUNG HISTORY: Do you have any history of lung disease?  (e.g., pulmonary embolus, asthma, emphysema)     ILD 8. CAUSE: What do you think is causing the breathing problem?      unknown 9. OTHER SYMPTOMS: Do you have any other symptoms? (e.g., chest pain, cough, dizziness, fever, runny nose)     Runny nose - clear 10. O2 SATURATION MONITOR:  Do you use an oxygen saturation monitor (pulse oximeter) at home? If Yes, ask: What is your reading (oxygen level) today? What is your usual oxygen saturation reading? (e.g., 95%)  See above 11. PREGNANCY: Is there any chance you are pregnant? When was your last menstrual period?       N/a 12. TRAVEL: Have you traveled out of the country in the last month? (e.g., travel history, exposures)       denies    Triager attempted to schedule with [facility], but no access. Pt was  scheduled with alternate provider at PCP office. Caregiver Patient verbalized understanding and to call back/911 with worsening symptoms.  Protocols used: Breathing Difficulty-A-AH

## 2024-02-19 NOTE — Telephone Encounter (Signed)
 Copied from CRM #[id number]. Topic: Clinical - Medication Question >> Feb 16, 2024  4:21 PM Rozanna G wrote: Reason for CRM:PT STATED HE WOULD LIKE TO SPEAK WITH SOMEONE ABOUT GETTING OXYGEN. STATED HE WOULD LIKE TO KEEP SEEING [facility] PROVIDERS [facility] OF [facility].  ATC x1 LVM for patient to call our office back regarding prior message .

## 2024-02-19 NOTE — Telephone Encounter (Signed)
 I called and spoke to the pt. Pt states he has had some s.o.b. Severe s.o.b and has went on progressively for 2-3 weeks. No inhalers and no O2. Pt states he is going to see Dr Geofm tomorrow. NFN

## 2024-02-19 NOTE — Telephone Encounter (Signed)
 Copied from CRM #[id number]. Topic: Clinical - Medication Question >> Feb 16, 2024  4:21 PM Rozanna G wrote: Reason for CRM:PT STATED HE WOULD LIKE TO SPEAK WITH SOMEONE ABOUT GETTING OXYGEN. STATED HE WOULD LIKE TO KEEP SEEING [facility] PROVID[facility] OF [facility].  Spoke with patient regarding prior message. Patient stated he would like to have a walk to se if patient does qualify for oxygen.Advised patient that can happen at his office visit on 04/09/2024. Patient's voice was understanding. Nothing else further needed.

## 2024-02-19 NOTE — Telephone Encounter (Signed)
 PT has APT scheduled

## 2024-02-20 ENCOUNTER — Encounter: Payer: Self-pay | Admitting: Internal Medicine

## 2024-02-20 ENCOUNTER — Ambulatory Visit (INDEPENDENT_AMBULATORY_CARE_PROVIDER_SITE_OTHER): Admitting: Internal Medicine

## 2024-02-20 VITALS — BP 132/70 | HR 62 | Temp 98.0°F | Ht 68.0 in | Wt 210.0 lb

## 2024-02-20 DIAGNOSIS — J449 Chronic obstructive pulmonary disease, unspecified: Secondary | ICD-10-CM | POA: Diagnosis not present

## 2024-02-20 DIAGNOSIS — R053 Chronic cough: Secondary | ICD-10-CM | POA: Diagnosis not present

## 2024-02-20 DIAGNOSIS — J849 Interstitial pulmonary disease, unspecified: Secondary | ICD-10-CM | POA: Diagnosis not present

## 2024-02-20 MED ORDER — ALBUTEROL SULFATE HFA 108 (90 BASE) MCG/ACT IN AERS: 2.0000 | INHALATION_SPRAY | Freq: Four times a day (QID) | RESPIRATORY_TRACT | 8 refills | Status: DC | PRN

## 2024-02-20 MED ORDER — SPACER/AERO-HOLDING CHAMBERS DEVI: 0 refills | Status: AC

## 2024-02-20 NOTE — Patient Instructions (Addendum)
    There is no evidence of a disease - these are symptoms of your chronic lung disease   Medications changes include :   albuterol  inhaler every 6 hours as needed    Follow up with pulmonary

## 2024-02-20 NOTE — Progress Notes (Signed)
 Subjective:    Patient ID: Jonathan Medina, male    DOB: 1929/12/07, 89 y.o.   MRN: [id number]      HPI Jonathan Medina is here for  Chief Complaint  Patient presents with   Cough    3 weeks cough started; Was getting better then it came back;  Patient coughs so much that he has SOB (over a year but getting worse)     Discussed the use of AI scribe software for clinical note transcription with the patient, who gave verbal consent to proceed.  History of Present Illness Jonathan Medina is a 89 year old male with interstitial lung disease and COPD who presents with chronic cough and difficulty breathing. He is accompanied by his wife, who is his primary caregiver and his daughter.  He has been experiencing a chronic cough for over a year, described as intense and sometimes causing a sensation of 'gagging for breath.' The cough is productive, with mucus that is mostly colorless but can sometimes appear green or brown. He often removes his CPAP machine at night due to coughing. Relief is found by gargling with hot salt water  and drinking hot tea, which helps to break up the mucus.  He has a history of using a nebulizer with a steroid and albuterol  in March 2024, which caused swelling in his mouth due to not rinsing after use. He stopped the nebulizer after 14 days. Currently, he uses Robitussin as needed, which he finds helpful in managing his symptoms. Increasing water  intake has also been beneficial.  No wheezing, tightness in his airways, shortness of breath with exertion, fever, chest pain, or facial or ear pain. He experiences nasal drainage, which is mostly clear.  His wife reports that the coughing is severe enough to cause concern, and he sometimes goes outside to avoid disturbing her. She also notes that he has been self-diagnosing and not adhering consistently to treatments, which has been a challenge in managing his symptoms.     Medications and allergies reviewed with patient and  updated if appropriate.  Current Outpatient Medications on File Prior to Visit  Medication Sig Dispense Refill   albuterol  (PROVENTIL ) (2.5 MG/3ML) 0.083% nebulizer solution Take 3 mLs (2.5 mg total) by nebulization 2 (two) times daily. 75 mL 12   amLODipine  (NORVASC ) 5 MG tablet TAKE 1 TABLET DAILY 90 tablet 3   carvedilol  (COREG  CR) 20 MG 24 hr capsule TAKE 1 CAPSULE EVERY MORNING 90 capsule 3   diclofenac  Sodium (VOLTAREN ) 1 % GEL Apply 4 g topically 4 (four) times daily. 100 g 4   olopatadine (PATANOL) 0.1 % ophthalmic solution Place 1 drop into both eyes daily.     oxyCODONE  (OXY IR/ROXICODONE ) 5 MG immediate release tablet Take 5 mg by mouth in the morning and at bedtime.     tamsulosin  (FLOMAX ) 0.4 MG CAPS capsule Take 1 capsule (0.4 mg total) by mouth daily after breakfast. 90 capsule 0   No current facility-administered medications on file prior to visit.    Review of Systems  Constitutional:  Negative for fever.  HENT:  Positive for rhinorrhea (clear). Negative for congestion, ear pain and sore throat.   Respiratory:  Positive for cough (productive at times - clear- mucus). Negative for chest tightness, shortness of breath and wheezing.   Cardiovascular:  Negative for chest pain.       Objective:   Vitals:   02/20/24 1546  BP: 132/70  Pulse: 62  Temp: 98 F (36.7 C)  SpO2: 98%   BP Readings from Last 3 Encounters:  02/20/24 132/70  01/22/24 130/80  08/14/23 130/70   Wt Readings from Last 3 Encounters:  02/20/24 210 lb (95.3 kg)  01/22/24 215 lb 3.2 oz (97.6 kg)  01/17/24 224 lb (101.6 kg)   Body mass index is 31.93 kg/m.    Physical Exam Constitutional:      General: He is not in acute distress.    Appearance: Normal appearance. He is not ill-appearing.  HENT:     Head: Normocephalic.     Right Ear: Tympanic membrane, ear canal and external ear normal. There is no impacted cerumen.     Left Ear: Tympanic membrane, ear canal and external ear normal.  There is no impacted cerumen.     Mouth/Throat:     Mouth: Mucous membranes are moist.     Pharynx: No oropharyngeal exudate or posterior oropharyngeal erythema.  Eyes:     Conjunctiva/sclera: Conjunctivae normal.  Cardiovascular:     Rate and Rhythm: Normal rate and regular rhythm.  Pulmonary:     Effort: Pulmonary effort is normal. No respiratory distress.     Breath sounds: Rales (bibasilar dry crackles) present. No wheezing.  Musculoskeletal:     Cervical back: Neck supple. No tenderness.  Lymphadenopathy:     Cervical: No cervical adenopathy.  Skin:    General: Skin is warm and dry.     Findings: No rash.  Neurological:     Mental Status: He is alert.            Assessment & Plan:    Assessment and Plan Assessment & Plan Chronic cough, Interstitial lung disease with pulmonary fibrosis and COPD ILD stable on 06/2023 CT.  Today Oxygen saturation at 98%. Dry crackles at lung bases.  Cough related to ILD, possibly COPD No evidence of infection He will not use an nebulizer treatment - Prescribe albuterol  inhaler with spacer  - continue CPAP use at night. - Use Robitussin as needed for cough. - continue good hydration, salt water  gargles - Use saline nasal spray in the morning and saline gel at bedtime. - Use Flonase nasal spray twice daily. - Follow up with pulmonary specialist for further management.

## 2024-02-21 NOTE — Telephone Encounter (Unsigned)
 Copied from CRM #[id number]. Topic: Clinical - Order For Equipment >> Feb 21, 2024 11:26 AM Devaughn RAMAN wrote: Reason for CRM: Patient called in regarding CPAP machine. Patient stated he received a message on his CPAP machine about 3 weeks ago that read to contact his provider to get a new machine and he was calling to do so. Patient stated the message read that it has exceeded its usefulness. Patient stated he contacted [facility], [place], and Dr.Alva office.

## 2024-02-21 NOTE — Telephone Encounter (Signed)
 PT called upset no one has called him back. Per inst, I messaged Shelby to dbl booked PT for tomorrow with Ms. Orlie so we can order a new CPAP.

## 2024-02-22 ENCOUNTER — Encounter: Payer: Self-pay | Admitting: Adult Health

## 2024-02-22 ENCOUNTER — Ambulatory Visit (INDEPENDENT_AMBULATORY_CARE_PROVIDER_SITE_OTHER): Admitting: Adult Health

## 2024-02-22 VITALS — BP 126/76 | HR 72 | Ht 68.0 in | Wt 224.2 lb

## 2024-02-22 DIAGNOSIS — R911 Solitary pulmonary nodule: Secondary | ICD-10-CM

## 2024-02-22 DIAGNOSIS — Z87891 Personal history of nicotine dependence: Secondary | ICD-10-CM | POA: Diagnosis not present

## 2024-02-22 DIAGNOSIS — J439 Emphysema, unspecified: Secondary | ICD-10-CM

## 2024-02-22 DIAGNOSIS — J8410 Pulmonary fibrosis, unspecified: Secondary | ICD-10-CM | POA: Diagnosis not present

## 2024-02-22 DIAGNOSIS — G4733 Obstructive sleep apnea (adult) (pediatric): Secondary | ICD-10-CM

## 2024-02-22 DIAGNOSIS — J432 Centrilobular emphysema: Secondary | ICD-10-CM

## 2024-02-22 DIAGNOSIS — R053 Chronic cough: Secondary | ICD-10-CM

## 2024-02-22 DIAGNOSIS — J849 Interstitial pulmonary disease, unspecified: Secondary | ICD-10-CM

## 2024-02-22 NOTE — Telephone Encounter (Signed)
 Patient has appointment with tammy today at 4.will be addressed
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
## 2024-02-22 NOTE — Patient Instructions (Addendum)
 Continue on CPAP At bedtime   Order for new CPAP.  Saline nasal spray in am .  Saline nasal gel At bedtime   Keep up good work .  Do not drive if sleepy.  Work on healthy weight.   Claritin 10mg  daily As needed  drainage .    lbuterol inhaler or neb As needed   Robitussin DM As needed for cough   Activity as tolerated   CT chest in December   Follow up Dr. Jude or Bell Cai NP  in 4  months and As needed

## 2024-03-12 ENCOUNTER — Telehealth: Payer: Self-pay | Admitting: Radiology

## 2024-03-12 NOTE — Telephone Encounter (Signed)
 Copied from CRM [id number]. Topic: Clinical - Medication Question >> Mar 11, 2024  4:38 PM Wess RAMAN wrote: Reason for CRM: Patient would like to receive the covid shot and would [facility]  Callback #: [phone]  Pharmacy: [id number] [place], [place] - [address] AT [facility] OF [address] [place] [address] Phone: [phone] Fax: [phone] Hours: Open 24 hours

## 2024-03-13 NOTE — Telephone Encounter (Unsigned)
 Copied from CRM [id number]. Topic: Clinical - Request for Lab/Test Order >> Mar 13, 2024  3:33 PM Mia F wrote: Reason for CRM: Pt is asking for a script for a COVID vaccine to be sent to Address: [address] [place], [place] [address] Phone: [phone]

## 2024-03-15 ENCOUNTER — Telehealth: Payer: Self-pay | Admitting: Radiology

## 2024-03-15 ENCOUNTER — Other Ambulatory Visit: Payer: Self-pay

## 2024-03-15 DIAGNOSIS — Z23 Encounter for immunization: Secondary | ICD-10-CM

## 2024-03-15 MED ORDER — COVID-19 MRNA VAC-TRIS(PFIZER) 30 MCG/0.3ML IM SUSY: 0.3000 mL | PREFILLED_SYRINGE | Freq: Once | INTRAMUSCULAR | 0 refills | Status: AC

## 2024-03-15 NOTE — Telephone Encounter (Signed)
 RX has been sent. Patient has been made aware.

## 2024-03-15 NOTE — Telephone Encounter (Signed)
 Copied from CRM [id number]. Topic: Clinical - Medication Question >> Mar 15, 2024 10:40 AM Chasity T wrote: Reason for CRM:  Pt is asking for a script for a COVID vaccine to be sent to  Address: [facility] [address], [place], [place] [address] Phone: [phone]

## 2024-03-15 NOTE — Telephone Encounter (Signed)
 RX has been sent.

## 2024-03-21 ENCOUNTER — Telehealth: Payer: Self-pay | Admitting: Radiology

## 2024-03-21 NOTE — Telephone Encounter (Signed)
 Copied from CRM [id number]. Topic: Clinical - Medication Question >> Mar 21, 2024 10:50 AM Ahlexyia S wrote: Reason for CRM: Pt called in stating that [facility] is wanting Dr. Geofm to call them for medication albuterol  (VENTOLIN  HFA) 108 (90 Base) MCG/ACT inhaler so pt can receive it by mail moving forward. Pt provided the number [phone].

## 2024-03-22 ENCOUNTER — Other Ambulatory Visit: Payer: Self-pay

## 2024-03-22 MED ORDER — ALBUTEROL SULFATE HFA 108 (90 BASE) MCG/ACT IN AERS: 2.0000 | INHALATION_SPRAY | Freq: Four times a day (QID) | RESPIRATORY_TRACT | 8 refills | Status: AC | PRN

## 2024-03-22 NOTE — Telephone Encounter (Signed)
 Script sent

## 2024-03-22 NOTE — Telephone Encounter (Signed)
**Note De-identified  [place] Obfuscation** Please advise 

## 2024-03-26 DIAGNOSIS — Z23 Encounter for immunization: Secondary | ICD-10-CM | POA: Diagnosis not present

## 2024-04-05 NOTE — Telephone Encounter (Signed)
 Error

## 2024-04-09 ENCOUNTER — Ambulatory Visit: Admitting: Adult Health

## 2024-04-24 DIAGNOSIS — R3915 Urgency of urination: Secondary | ICD-10-CM | POA: Diagnosis not present

## 2024-04-24 DIAGNOSIS — N3941 Urge incontinence: Secondary | ICD-10-CM | POA: Diagnosis not present

## 2024-04-24 DIAGNOSIS — Z8546 Personal history of malignant neoplasm of prostate: Secondary | ICD-10-CM | POA: Diagnosis not present

## 2024-04-24 DIAGNOSIS — R351 Nocturia: Secondary | ICD-10-CM | POA: Diagnosis not present

## 2024-04-24 DIAGNOSIS — N401 Enlarged prostate with lower urinary tract symptoms: Secondary | ICD-10-CM | POA: Diagnosis not present

## 2024-04-24 DIAGNOSIS — N3010 Interstitial cystitis (chronic) without hematuria: Secondary | ICD-10-CM | POA: Diagnosis not present

## 2024-04-24 DIAGNOSIS — R350 Frequency of micturition: Secondary | ICD-10-CM | POA: Diagnosis not present

## 2024-06-19 ENCOUNTER — Ambulatory Visit (HOSPITAL_COMMUNITY): Admission: RE | Admit: 2024-06-19 | Discharge: 2024-06-19 | Attending: Adult Health | Admitting: Adult Health

## 2024-06-19 ENCOUNTER — Encounter (HOSPITAL_COMMUNITY): Payer: Self-pay

## 2024-06-19 DIAGNOSIS — J849 Interstitial pulmonary disease, unspecified: Secondary | ICD-10-CM | POA: Insufficient documentation

## 2024-07-10 ENCOUNTER — Telehealth: Payer: Self-pay

## 2024-07-10 NOTE — Telephone Encounter (Signed)
 Copied from CRM [id number]. Topic: Clinical - Lab/Test Results >> Jul 08, 2024 11:34 AM Jonathan Medina wrote: Reason for CRM: Patient would like Medina callback to discuss CT results.   Callback number:  [phone]   ATCx1 LVMTCB

## 2024-07-10 NOTE — Telephone Encounter (Signed)
 Pt called clinic back and hung up before being transferred to me. ATC patient again, no answer. Unable to LVM.

## 2024-07-10 NOTE — Telephone Encounter (Signed)
 Copied from CRM #[id number]. Topic: Clinical - Lab/Test Results >> Jul 10, 2024 10:31 AM Isabell A wrote: Reason for CRM: Patient returning phone call from Sabrea Sankey for CT results.. Attempt to contact CAL, no answer.   Callback number: [phone] >> Jul 10, 2024 11:14 AM Corean SAUNDERS wrote: Patient states he missed another call from [facility] regarding CT results and is requesting her to please call again. E2C2 agent attempted to reach cal with no success.   Called and spoke to pt - pt is requesting CT results. Please advise, thank you!

## 2024-07-10 NOTE — Telephone Encounter (Signed)
 Copied from CRM #[id number]. Topic: Clinical - Lab/Test Results >> Jul 10, 2024 10:31 AM Isabell A wrote: Reason for CRM: Patient returning phone call from Sandrine Bloodsworth for CT results.. Attempt to contact CAL, no answer.   Callback number: [phone]   ATCx2, unable to LVM

## 2024-07-11 ENCOUNTER — Ambulatory Visit: Payer: Self-pay | Admitting: Adult Health

## 2024-07-11 NOTE — Telephone Encounter (Signed)
 CT chest results were sent to patient as well.  Scarring/fibrosis, emphysema and lung nodules were all stable which means unchanged which is good news but we will discuss in full detail at follow-up visit

## 2024-07-12 ENCOUNTER — Telehealth: Payer: Self-pay

## 2024-07-12 NOTE — Telephone Encounter (Signed)
 Copied from CRM [id number]. Topic: Clinical - Lab/Test Results >> Jul 11, 2024  4:52 PM Rilla B wrote: Reason for CRM: Patient calling for CT results. States he had CT on December 17 and he has had anyone call to discuss his results.  Stated he spoke with someone who said Tammy Parrett was going to call him and no one ever called.  Please call patient st [phone].    Spoke with patient Ct results VBU, 4 month f/u schedule as well  -NFN

## 2024-07-15 NOTE — Telephone Encounter (Signed)
 ATC someone picked up but didn't respond.

## 2024-07-16 NOTE — Telephone Encounter (Signed)
 ATCx2 - LVMTCB

## 2024-07-16 NOTE — Telephone Encounter (Signed)
 F/u   Patient is asking for Megan to give him a call back

## 2024-07-16 NOTE — Telephone Encounter (Signed)
 Patient returning call he received from meagan . Tried calling clinic access line but no respoonse . Please give patient a call back . He say he is 89 years old and bout time he get to call they are no longer and there and when trying to call back he can never reach anyone . Please give patient a call back asap . [phone]

## 2024-07-18 NOTE — Telephone Encounter (Signed)
 Reviewed ct chest with patient,verbalized understanding.NFN

## 2024-08-22 ENCOUNTER — Ambulatory Visit: Admitting: Adult Health

## 2025-01-17 ENCOUNTER — Ambulatory Visit
